# Patient Record
Sex: Female | Born: 1945 | Race: Black or African American | Hispanic: No | Marital: Married | Smoking: Never smoker
Health system: Southern US, Community
[De-identification: ages and names within clinical notes are randomized; demographics above are authoritative.]

## PROBLEM LIST (undated history)

## (undated) DIAGNOSIS — E785 Hyperlipidemia, unspecified: Secondary | ICD-10-CM

## (undated) DIAGNOSIS — I10 Essential (primary) hypertension: Secondary | ICD-10-CM

## (undated) DIAGNOSIS — F419 Anxiety disorder, unspecified: Secondary | ICD-10-CM

## (undated) DIAGNOSIS — M1990 Unspecified osteoarthritis, unspecified site: Secondary | ICD-10-CM

## (undated) DIAGNOSIS — G4730 Sleep apnea, unspecified: Secondary | ICD-10-CM

## (undated) DIAGNOSIS — K219 Gastro-esophageal reflux disease without esophagitis: Secondary | ICD-10-CM

## (undated) DIAGNOSIS — E039 Hypothyroidism, unspecified: Secondary | ICD-10-CM

## (undated) HISTORY — PX: CHOLECYSTECTOMY: SHX55

## (undated) HISTORY — PX: EYE SURGERY: SHX253

## (undated) HISTORY — PX: ABDOMINAL HYSTERECTOMY: SHX81

---

## 1997-04-12 ENCOUNTER — Emergency Department (HOSPITAL_COMMUNITY): Admission: EM | Admit: 1997-04-12 | Discharge: 1997-04-12 | Payer: Self-pay | Admitting: Emergency Medicine

## 1999-01-22 ENCOUNTER — Encounter: Payer: Self-pay | Admitting: Ophthalmology

## 1999-01-22 ENCOUNTER — Ambulatory Visit (HOSPITAL_COMMUNITY): Admission: RE | Admit: 1999-01-22 | Discharge: 1999-01-23 | Payer: Self-pay | Admitting: Ophthalmology

## 1999-08-20 ENCOUNTER — Other Ambulatory Visit: Admission: RE | Admit: 1999-08-20 | Discharge: 1999-08-20 | Payer: Self-pay | Admitting: Gynecology

## 2000-07-15 ENCOUNTER — Emergency Department (HOSPITAL_COMMUNITY): Admission: EM | Admit: 2000-07-15 | Discharge: 2000-07-15 | Payer: Self-pay | Admitting: Emergency Medicine

## 2000-07-15 ENCOUNTER — Encounter: Payer: Self-pay | Admitting: Emergency Medicine

## 2002-01-25 ENCOUNTER — Other Ambulatory Visit: Admission: RE | Admit: 2002-01-25 | Discharge: 2002-01-25 | Payer: Self-pay | Admitting: Gynecology

## 2002-11-19 ENCOUNTER — Emergency Department (HOSPITAL_COMMUNITY): Admission: EM | Admit: 2002-11-19 | Discharge: 2002-11-19 | Payer: Self-pay | Admitting: Emergency Medicine

## 2003-02-18 ENCOUNTER — Encounter: Admission: RE | Admit: 2003-02-18 | Discharge: 2003-02-18 | Payer: Self-pay | Admitting: Orthopedic Surgery

## 2003-03-01 ENCOUNTER — Emergency Department (HOSPITAL_COMMUNITY): Admission: EM | Admit: 2003-03-01 | Discharge: 2003-03-02 | Payer: Self-pay | Admitting: Emergency Medicine

## 2003-03-03 ENCOUNTER — Emergency Department (HOSPITAL_COMMUNITY): Admission: EM | Admit: 2003-03-03 | Discharge: 2003-03-03 | Payer: Self-pay | Admitting: Emergency Medicine

## 2003-03-06 ENCOUNTER — Ambulatory Visit (HOSPITAL_COMMUNITY): Admission: RE | Admit: 2003-03-06 | Discharge: 2003-03-06 | Payer: Self-pay | Admitting: Urology

## 2004-07-10 ENCOUNTER — Emergency Department: Payer: Self-pay | Admitting: Emergency Medicine

## 2004-07-10 ENCOUNTER — Other Ambulatory Visit: Payer: Self-pay

## 2004-09-13 ENCOUNTER — Emergency Department: Payer: Self-pay | Admitting: General Practice

## 2007-02-05 ENCOUNTER — Emergency Department (HOSPITAL_COMMUNITY): Admission: EM | Admit: 2007-02-05 | Discharge: 2007-02-05 | Payer: Self-pay | Admitting: Emergency Medicine

## 2008-02-28 ENCOUNTER — Encounter (HOSPITAL_COMMUNITY): Admission: RE | Admit: 2008-02-28 | Discharge: 2008-05-28 | Payer: Self-pay | Admitting: Endocrinology

## 2008-03-26 ENCOUNTER — Other Ambulatory Visit: Admission: RE | Admit: 2008-03-26 | Discharge: 2008-03-26 | Payer: Self-pay | Admitting: Interventional Radiology

## 2008-03-26 ENCOUNTER — Encounter (INDEPENDENT_AMBULATORY_CARE_PROVIDER_SITE_OTHER): Payer: Self-pay | Admitting: Interventional Radiology

## 2008-03-26 ENCOUNTER — Encounter: Admission: RE | Admit: 2008-03-26 | Discharge: 2008-03-26 | Payer: Self-pay | Admitting: Endocrinology

## 2008-03-31 ENCOUNTER — Inpatient Hospital Stay (HOSPITAL_COMMUNITY): Admission: EM | Admit: 2008-03-31 | Discharge: 2008-04-08 | Payer: Self-pay | Admitting: Emergency Medicine

## 2008-05-01 ENCOUNTER — Encounter (HOSPITAL_COMMUNITY): Admission: RE | Admit: 2008-05-01 | Discharge: 2008-07-30 | Payer: Self-pay | Admitting: Endocrinology

## 2008-06-20 ENCOUNTER — Emergency Department: Payer: Self-pay | Admitting: Emergency Medicine

## 2008-10-12 ENCOUNTER — Emergency Department: Payer: Self-pay | Admitting: Emergency Medicine

## 2009-03-03 ENCOUNTER — Emergency Department (HOSPITAL_COMMUNITY): Admission: EM | Admit: 2009-03-03 | Discharge: 2009-03-03 | Payer: Self-pay | Admitting: Emergency Medicine

## 2009-08-25 IMAGING — US US BIOPSY
1 series · 13 of 17 positions shown · non-contrast
Comparison: none

Clinical Data/Indication: Bilateral thyroid nodules

ULTRASOUND-GUIDED BIOPSYBILATERAL DOMINANT THYROID NODULES
Procedure: The procedure, risks, benefits, and alternatives were
explained to the patient. Questions regarding the procedure were
encouraged and answered. The patient understands and consents to
the procedure.
The neck was prepped withbetadine in a sterile fasion, and a
sterile drape was applied covering the operative field. A sterile
gown and sterile gloves were used for the procedure.
Under sonographic guidance, three 25 gauge fine needle aspirates of
the dominant left thyroid nodule were obtained.  Subsequently,
under sonographic guidance, one 25 gauge fine needle aspirates of
the dominant right thyroid nodule was obtained. Subsequently, two
fine needle aspirates of the dominant right thyroid nodule were
obtained.  Final imaging was performed.
Patient tolerated the procedure well without complication.  Vital
sign monitoring by nursing staff during the procedure will continue
as patient is in the special procedures unit for post procedure
observation.

[Series 1: us biopsy · 0.09mm/px · 17 acquisitions, 13 frames shown]
[im 1/17]
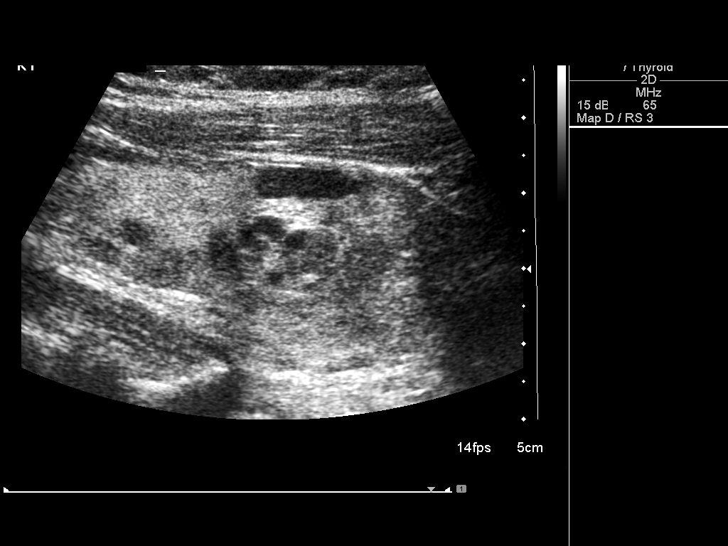
[im 2/17]
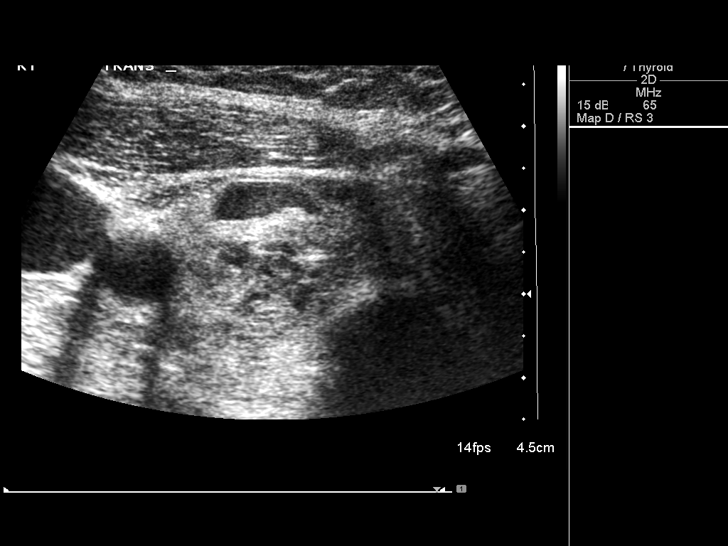
[im 4/17]
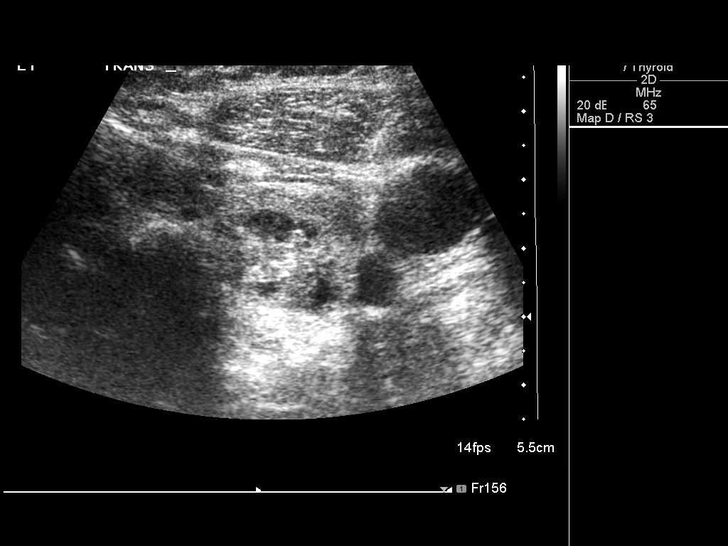
[im 5/17]
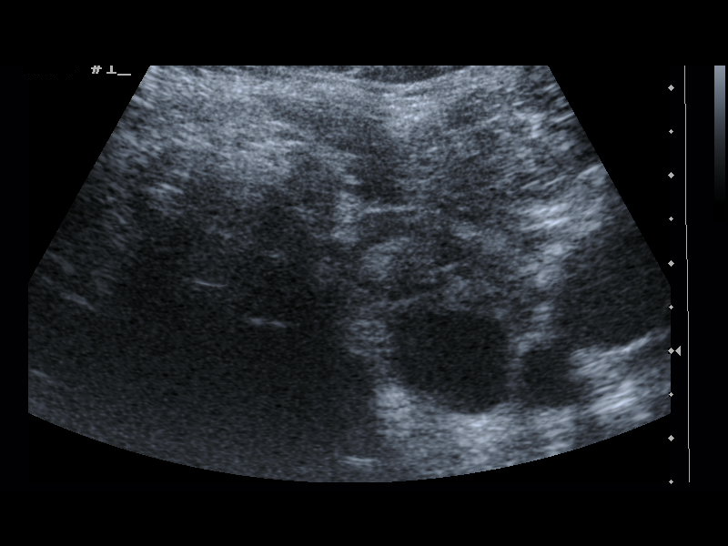
[im 6/17]
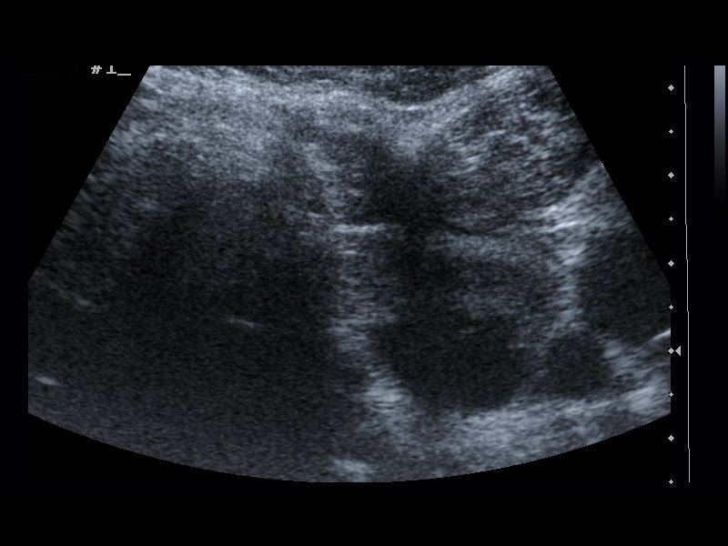
[im 8/17]
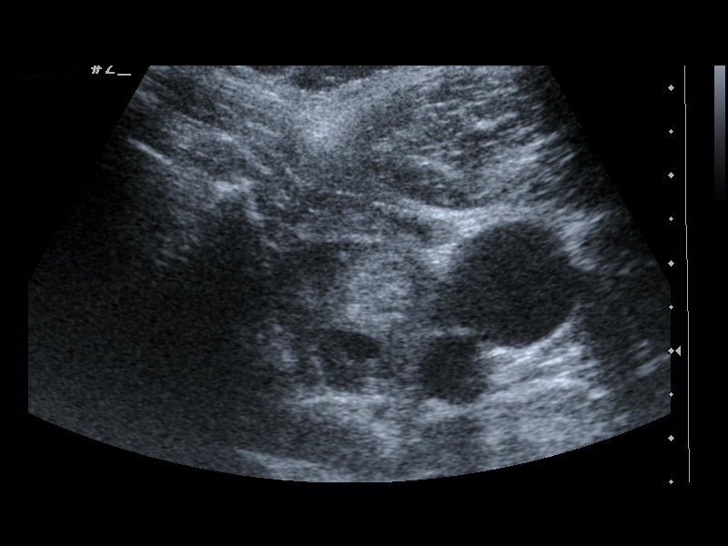
[im 9/17]
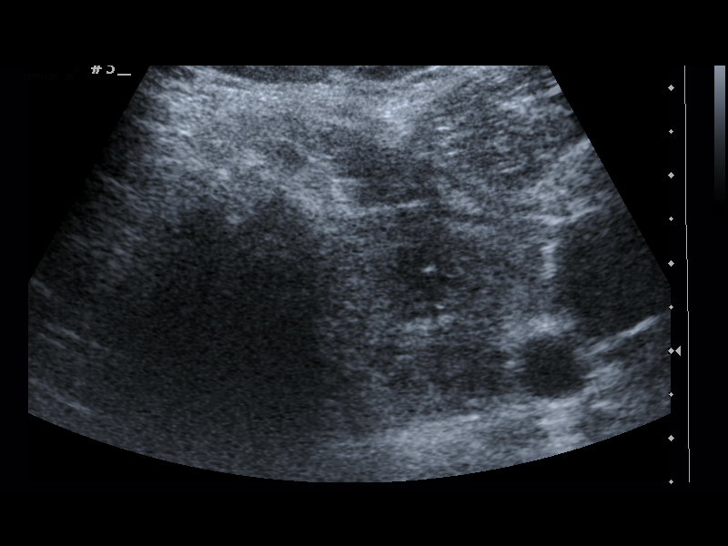
[im 10/17]
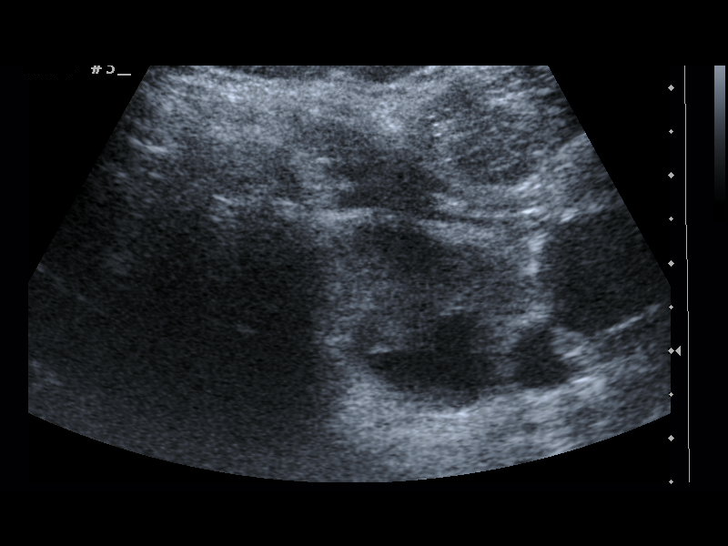
[im 12/17]
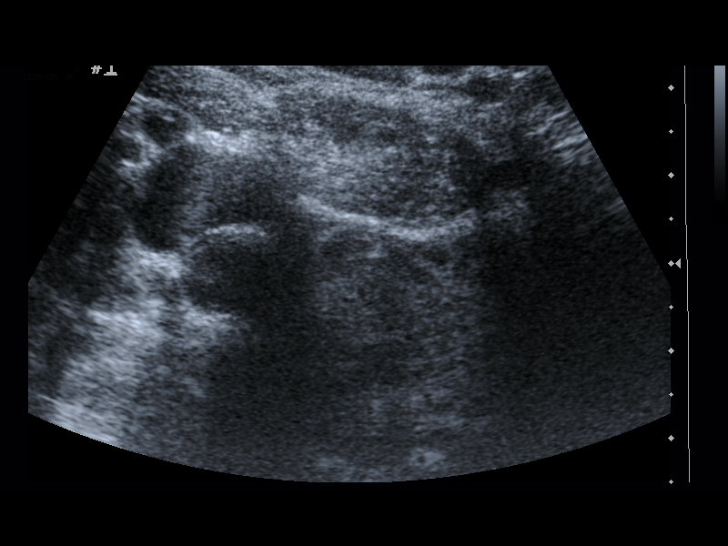
[im 13/17]
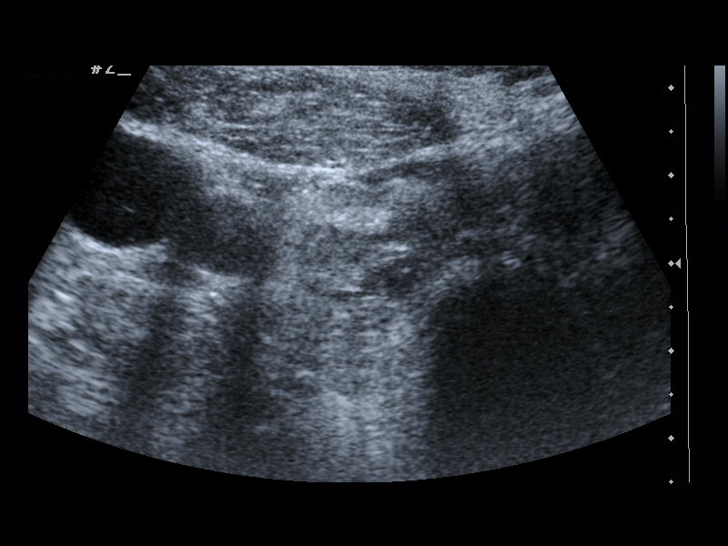
[im 14/17]
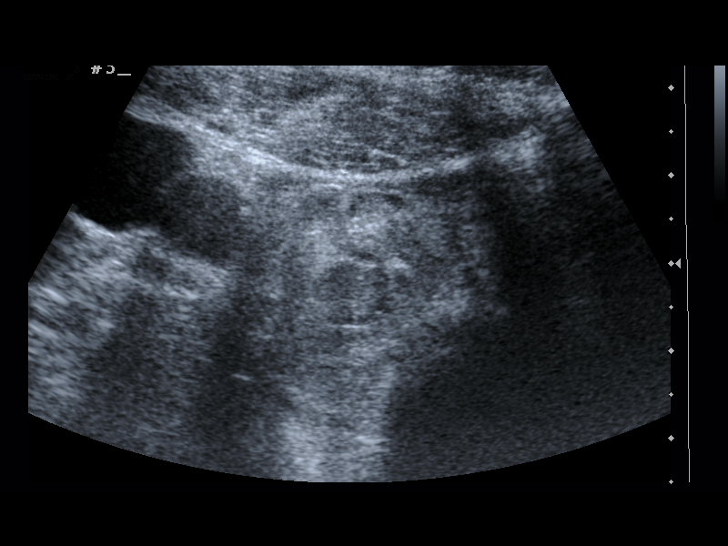
[im 16/17]
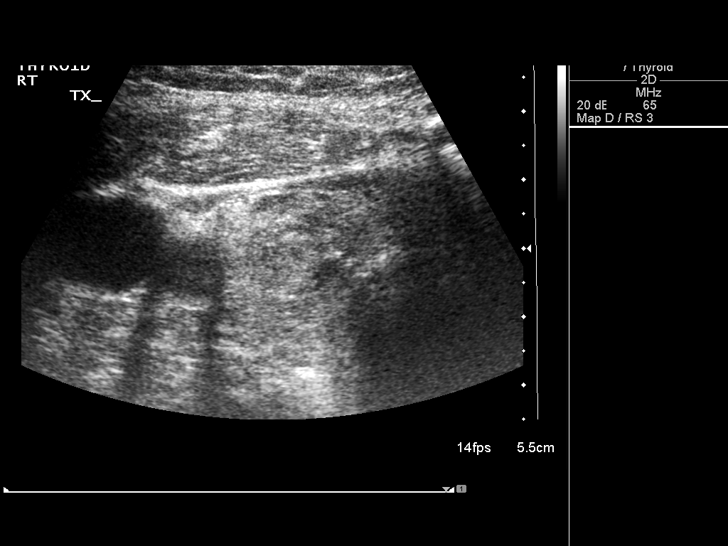
[im 17/17]
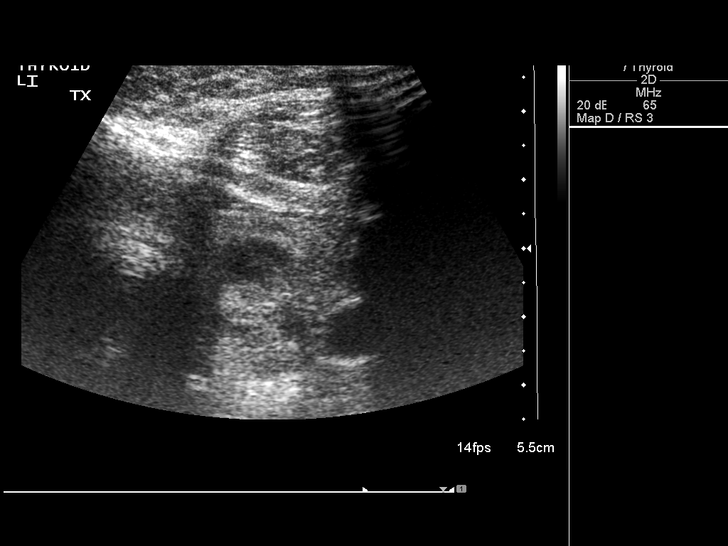

[13 of 17 positions shown; findings below may reference images not displayed]

FINDINGS: The images document guide needle placement within the
bilateral thyroid nodules. Post biopsy images demonstrate no
hemorrhage.
IMPRESSION: Successful ultrasound-guided fine needle aspiration of bilateral
thyroid nodules.

## 2009-09-30 IMAGING — NM NM RAI THERAPY FOR HYPERTHYROIDISM
1 series · 1 of 1 positions shown · non-contrast
Comparison: Thyroid uptake and scan dated 02/29/2008

CLINICAL DATA: Hyperthyroidism

NUCLEAR MEDICINE RADIOACTIVE IODINE THERAPY FOR HYPERTHYROIDISM
TECHNIQUE: The risks and benefits of radioactive iodine therapy
were discussed with the patient in detail. Alternative therapies
were also mentioned. Radiation safety was discussed with the
patient, including how to protect the general public from exposure.
There were no barriers to communication.  Written consent was
obtained.  The patient then received a capsule containing the
radiopharmaceutical.  The patient will follow-up with the referring
physician.
Radiopharmaceutical: 29.1 millicuries of I 131 sodium iodide

[st static image · 1 of 1 slices shown]
[im 1/1]
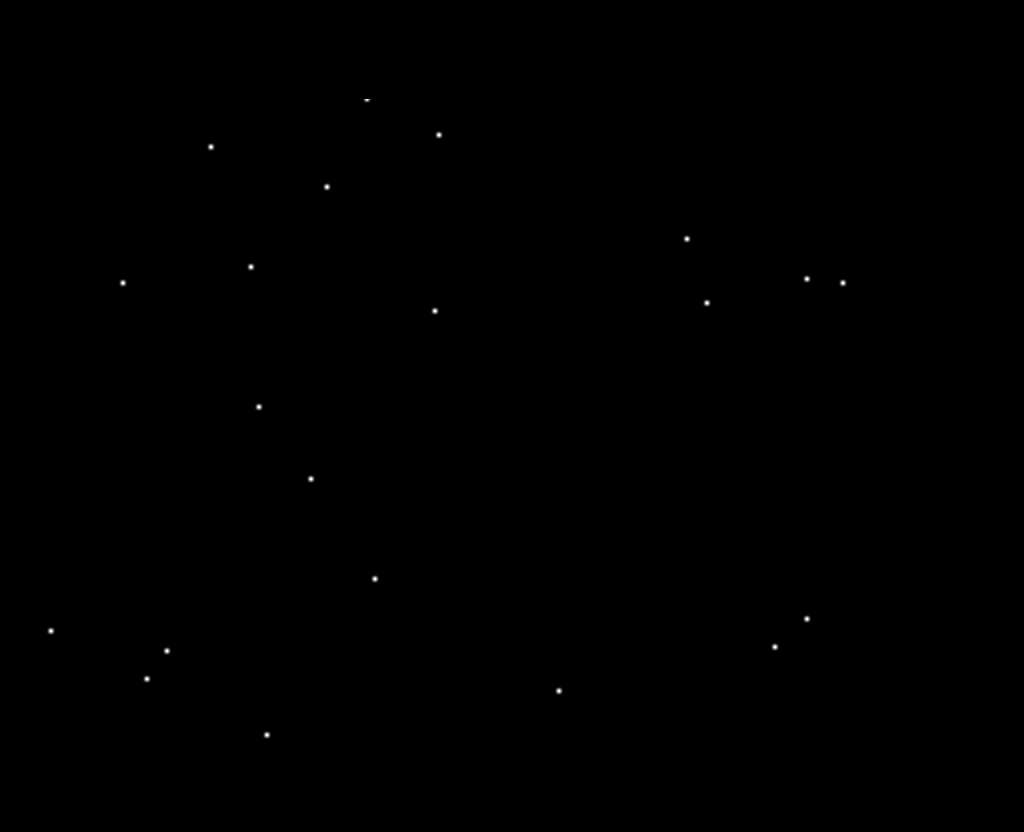

[1 of 1 positions shown; findings below may reference images not displayed]

IMPRESSION: Per oral administration of radioactive iodine for the treatment of
hyperthyroidism.

## 2009-12-18 ENCOUNTER — Emergency Department (HOSPITAL_COMMUNITY)
Admission: EM | Admit: 2009-12-18 | Discharge: 2009-12-18 | Payer: Self-pay | Source: Home / Self Care | Admitting: Emergency Medicine

## 2010-02-10 ENCOUNTER — Other Ambulatory Visit: Payer: Self-pay | Admitting: Obstetrics and Gynecology

## 2010-03-19 ENCOUNTER — Emergency Department (HOSPITAL_COMMUNITY)
Admission: EM | Admit: 2010-03-19 | Discharge: 2010-03-19 | Disposition: A | Discharge status: Home / Self Care | Payer: Medicare Other | Attending: Emergency Medicine | Admitting: Emergency Medicine

## 2010-03-19 ENCOUNTER — Emergency Department (HOSPITAL_COMMUNITY): Payer: Medicare Other

## 2010-03-19 DIAGNOSIS — E119 Type 2 diabetes mellitus without complications: Secondary | ICD-10-CM | POA: Insufficient documentation

## 2010-03-19 DIAGNOSIS — M5430 Sciatica, unspecified side: Secondary | ICD-10-CM | POA: Insufficient documentation

## 2010-03-19 DIAGNOSIS — Z794 Long term (current) use of insulin: Secondary | ICD-10-CM | POA: Insufficient documentation

## 2010-03-19 DIAGNOSIS — M549 Dorsalgia, unspecified: Secondary | ICD-10-CM | POA: Insufficient documentation

## 2010-03-19 DIAGNOSIS — I10 Essential (primary) hypertension: Secondary | ICD-10-CM | POA: Insufficient documentation

## 2010-04-01 LAB — URINALYSIS, ROUTINE W REFLEX MICROSCOPIC
Bilirubin Urine: NEGATIVE
Glucose, UA: NEGATIVE mg/dL
Hgb urine dipstick: NEGATIVE
Ketones, ur: NEGATIVE mg/dL
Nitrite: NEGATIVE
Protein, ur: NEGATIVE mg/dL
Specific Gravity, Urine: 1.026 (ref 1.005–1.030)
Urobilinogen, UA: 0.2 mg/dL (ref 0.0–1.0)
pH: 5.5 (ref 5.0–8.0)

## 2010-04-23 LAB — GLUCOSE, CAPILLARY
Glucose-Capillary: 101 mg/dL — ABNORMAL HIGH (ref 70–99)
Glucose-Capillary: 104 mg/dL — ABNORMAL HIGH (ref 70–99)
Glucose-Capillary: 108 mg/dL — ABNORMAL HIGH (ref 70–99)
Glucose-Capillary: 113 mg/dL — ABNORMAL HIGH (ref 70–99)
Glucose-Capillary: 114 mg/dL — ABNORMAL HIGH (ref 70–99)
Glucose-Capillary: 118 mg/dL — ABNORMAL HIGH (ref 70–99)
Glucose-Capillary: 119 mg/dL — ABNORMAL HIGH (ref 70–99)
Glucose-Capillary: 120 mg/dL — ABNORMAL HIGH (ref 70–99)
Glucose-Capillary: 126 mg/dL — ABNORMAL HIGH (ref 70–99)
Glucose-Capillary: 132 mg/dL — ABNORMAL HIGH (ref 70–99)
Glucose-Capillary: 135 mg/dL — ABNORMAL HIGH (ref 70–99)
Glucose-Capillary: 144 mg/dL — ABNORMAL HIGH (ref 70–99)
Glucose-Capillary: 62 mg/dL — ABNORMAL LOW (ref 70–99)
Glucose-Capillary: 63 mg/dL — ABNORMAL LOW (ref 70–99)
Glucose-Capillary: 84 mg/dL (ref 70–99)
Glucose-Capillary: 89 mg/dL (ref 70–99)
Glucose-Capillary: 94 mg/dL (ref 70–99)
Glucose-Capillary: 96 mg/dL (ref 70–99)

## 2010-04-23 LAB — BASIC METABOLIC PANEL
BUN: 13 mg/dL (ref 6–23)
BUN: 17 mg/dL (ref 6–23)
BUN: 18 mg/dL (ref 6–23)
BUN: 21 mg/dL (ref 6–23)
CO2: 27 mEq/L (ref 19–32)
CO2: 32 mEq/L (ref 19–32)
Calcium: 8.9 mg/dL (ref 8.4–10.5)
Calcium: 8.9 mg/dL (ref 8.4–10.5)
Calcium: 8.9 mg/dL (ref 8.4–10.5)
Chloride: 107 mEq/L (ref 96–112)
Chloride: 107 mEq/L (ref 96–112)
Creatinine, Ser: 1.62 mg/dL — ABNORMAL HIGH (ref 0.4–1.2)
Creatinine, Ser: 1.75 mg/dL — ABNORMAL HIGH (ref 0.4–1.2)
Creatinine, Ser: 1.98 mg/dL — ABNORMAL HIGH (ref 0.4–1.2)
GFR calc Af Amer: 36 mL/min — ABNORMAL LOW (ref >60)
GFR calc Af Amer: 37 mL/min — ABNORMAL LOW (ref >60)
GFR calc Af Amer: 39 mL/min — ABNORMAL LOW (ref >60)
GFR calc non Af Amer: 26 mL/min — ABNORMAL LOW (ref >60)
GFR calc non Af Amer: 30 mL/min — ABNORMAL LOW (ref >60)
GFR calc non Af Amer: 30 mL/min — ABNORMAL LOW (ref >60)
GFR calc non Af Amer: 31 mL/min — ABNORMAL LOW (ref >60)
GFR calc non Af Amer: >60 mL/min (ref >60)
Glucose, Bld: 126 mg/dL — ABNORMAL HIGH (ref 70–99)
Glucose, Bld: 197 mg/dL — ABNORMAL HIGH (ref 70–99)
Glucose, Bld: 90 mg/dL (ref 70–99)
Potassium: 3.4 mEq/L — ABNORMAL LOW (ref 3.5–5.1)
Potassium: 3.6 mEq/L (ref 3.5–5.1)
Potassium: 4 mEq/L (ref 3.5–5.1)
Sodium: 138 mEq/L (ref 135–145)
Sodium: 139 mEq/L (ref 135–145)
Sodium: 140 mEq/L (ref 135–145)
Sodium: 142 mEq/L (ref 135–145)

## 2010-04-23 LAB — URINE MICROSCOPIC-ADD ON

## 2010-04-23 LAB — CBC
HCT: 37.4 % (ref 36.0–46.0)
Hemoglobin: 10.4 g/dL — ABNORMAL LOW (ref 12.0–15.0)
Hemoglobin: 10.7 g/dL — ABNORMAL LOW (ref 12.0–15.0)
MCV: 86.2 fL (ref 78.0–100.0)
MCV: 86.5 fL (ref 78.0–100.0)
Platelets: 139 10*3/uL — ABNORMAL LOW (ref 150–400)
Platelets: 149 10*3/uL — ABNORMAL LOW (ref 150–400)
RBC: 3.65 MIL/uL — ABNORMAL LOW (ref 3.87–5.11)
RBC: 3.75 MIL/uL — ABNORMAL LOW (ref 3.87–5.11)
RDW: 14 % (ref 11.5–15.5)
RDW: 14.2 % (ref 11.5–15.5)
WBC: 4.8 10*3/uL (ref 4.0–10.5)
WBC: 5.2 10*3/uL (ref 4.0–10.5)
WBC: 5.5 10*3/uL (ref 4.0–10.5)
WBC: 6.2 10*3/uL (ref 4.0–10.5)

## 2010-04-23 LAB — DIFFERENTIAL
Basophils Absolute: 0.1 10*3/uL (ref 0.0–0.1)
Eosinophils Relative: 1 % (ref 0–5)
Lymphocytes Relative: 28 % (ref 12–46)
Lymphocytes Relative: 31 % (ref 12–46)
Lymphs Abs: 1.5 10*3/uL (ref 0.7–4.0)
Monocytes Absolute: 0.5 10*3/uL (ref 0.1–1.0)
Monocytes Relative: 11 % (ref 3–12)
Monocytes Relative: 11 % (ref 3–12)
Neutro Abs: 2.7 10*3/uL (ref 1.7–7.7)

## 2010-04-23 LAB — URINALYSIS, ROUTINE W REFLEX MICROSCOPIC
Glucose, UA: NEGATIVE mg/dL
Hgb urine dipstick: NEGATIVE
Protein, ur: 30 mg/dL — AB
Specific Gravity, Urine: 1.022 (ref 1.005–1.030)
pH: 5 (ref 5.0–8.0)

## 2010-04-23 LAB — CULTURE, BLOOD (ROUTINE X 2)
Culture: NO GROWTH
Culture: NO GROWTH

## 2010-04-23 LAB — SODIUM, URINE, RANDOM: Sodium, Ur: 86 mEq/L

## 2010-04-23 LAB — CREATININE, URINE, RANDOM: Creatinine, Urine: 65 mg/dL

## 2010-04-23 LAB — CULTURE, RESPIRATORY W GRAM STAIN

## 2010-04-23 LAB — HEMOGLOBIN A1C
Hgb A1c MFr Bld: 8.1 % — ABNORMAL HIGH (ref 4.6–6.1)
Mean Plasma Glucose: 186 mg/dL

## 2010-04-23 LAB — EXPECTORATED SPUTUM ASSESSMENT W GRAM STAIN, RFLX TO RESP C

## 2010-04-23 LAB — URINE CULTURE: Colony Count: NO GROWTH

## 2010-04-23 LAB — BRAIN NATRIURETIC PEPTIDE: Pro B Natriuretic peptide (BNP): <30 pg/mL (ref 0.0–100.0)

## 2010-05-19 ENCOUNTER — Other Ambulatory Visit: Payer: Self-pay | Admitting: Dermatology

## 2010-05-26 NOTE — H&P (Signed)
NAMECYRILLA, [name] NO.:  [id number]   MEDICAL RECORD NO.:  [id number]          PATIENT TYPE:  INP   LOCATION:  1414                         FACILITY:  [facility]   PHYSICIAN:  Hollice Espy, M.D.DATE OF BIRTH:  05-14-1945   DATE OF ADMISSION:  03/31/2008  DATE OF DISCHARGE:                              HISTORY & PHYSICAL   PCP:  Dr. Casimiro Needle Altheimer.   CHIEF COMPLAINT:  Shortness of breath and cough.   HISTORY OF PRESENT ILLNESS:  The patient is a 62-year African American  female with a past medical history of obesity, hypertension and diabetes  mellitus who for the last 5 days has had problems with a productive  cough and shortness of breath.  She spoke to her PCP who had her on a Z-  Pak.  She had been on a Z-Pak for about 3 days but her symptoms actually  worsened.  She said she was having significant cough with greenish  sputum, dyspnea on exertion and overall worsening of symptoms.  She also  was feeling some low-grade fever so she came in the emergency room.  In  the emergency room she was actually noted to have a normal white count  with no shift.  However, a chest x-ray was noted to have some bibasilar  atelectasis versus infiltrates.  Other labs were done on the patient and  she was found to have a potassium of 3.4 and a blood sugar with CBG of  197.  She was noted to have a low-grade temperature of 100.1.  The felt  better after receiving 2 liters of oxygen, she was saturating 90% on  room air, 97% on 2 liters.  She was given Rocephin and Zithromax in the  emergency room as well as medicine for cough and a breathing treatment.  When I saw the patient she was doing okay, she complained of some  continued cough and shortness of breath, although much better than when  she first came in.  She denies any headaches, vision changes or  dysphagia, chest pain, palpitations, no abdominal pain.  No hematuria,  dysuria, constipation, diarrhea, focal extremity  numbness, weakness or  pain.  Review of systems otherwise negative.   PAST MEDICAL HISTORY:  1. Obesity.  2. Diabetes mellitus.  3. Hypertension.  She says her diabetes is not well controlled.   MEDICATIONS:  The patient is on:  1. Atacand 50.  2. Cardura 8.  3. Glipizide 5.  4. Hydrochlorothiazide 25.  5. The ER lists amiodarone, but the patient says she is on amlodipine,      will need to confirm this as well as the dose.  6. Lantus 90 units nightly.  7. Humalog sliding scale.   She has allergy to DEMEROL.   SOCIAL HISTORY:  She denies any tobacco, alcohol or drug use, although  she says her husband had smoked for about 20 years, he quit about 10-15  years ago.   FAMILY HISTORY:  Noncontributory.   PHYSICAL EXAMINATION:  VITALS ON ADMISSION:  Temperature 100.1, heart  rate 94, blood pressure 125/62, respirations 18, O2  sat 90% on room air,  97% on 2 liters.  GENERAL:  She is alert and oriented x3.  In no apparent distress.  HEENT:  Normocephalic, atraumatic.  Her mucous membranes are slightly  dry.  She has no carotid bruits.  HEART:  Regular rate and rhythm, S1-S2.  LUNGS:  Bilateral wheezing with some decreased breath sounds at the  bases.  ABDOMEN:  Soft, obese, nontender, positive bowel sounds.  EXTREMITIES:  No clubbing, cyanosis, trace pitting edema.   LAB WORK:  Sodium 138, potassium 3.4, chloride 95, bicarbonate 28.  BUN  13, creatinine 0.9, glucose 197.  White count 5.5, hemoglobin and  hematocrit 12.3 and 37, MCV of 87, platelet count 149.  I have ordered a  BNP which is pending.  Chest x-ray is as per HPI.   ASSESSMENT/PLAN:  1. Pneumonia.  Nebulizers plus oxygen plus antibiotics, question      component of mild chronic obstructive pulmonary disease secondary      to secondhand smoke.  2. Diabetes mellitus.  Sliding scale insulin plus Lantus.  3. Hypertension.  Continue medications.  Will check on confirming her      home medications.  4.  Obesity.      Hollice Espy, M.D.  Electronically Signed     SKK/MEDQ  D:  03/31/2008  T:  03/31/2008  Job:  [id number]   cc:   Veverly Fells. Altheimer, M.D.  Fax: [phone]

## 2010-05-26 NOTE — Discharge Summary (Signed)
[name], NORDQUIST NO.:  [id number]   MEDICAL RECORD NO.:  [id number]          PATIENT TYPE:  INP   LOCATION:  1414                         FACILITY:  [facility]   PHYSICIAN:  Ramiro Harvest, MD    DATE OF BIRTH:  Mar 18, 1945   DATE OF ADMISSION:  03/31/2008  DATE OF DISCHARGE:  04/07/2008                               DISCHARGE SUMMARY   PRIMARY CARE PHYSICIAN:  Veverly Fells. Altheimer, M.D.   DISCHARGE DIAGNOSES:  1. Pneumonia.  2. Acute renal insufficiency.  3. Hypertension.  4. Type 2 diabetes.  5. Probable hyperthyroidism per patient.  6. Gastroesophageal reflux disease.  7. Obesity.   DISCHARGE MEDICATIONS:  1. Atacand 32 mg p.o. daily to restart in 3-4 days.  2. HCTZ 25 mg 2 tablets p.o. daily to restart in 3-4 days.  3. Cardura 8 mg p.o. daily.  4. Clonidine 0.1 mg patch q. weekly.  5. Toprol XL 50 mg p.o. daily.  6. Norvasc 10 mg p.o. daily.  7. Humalog sliding scale as previously taken.  8. Alprazolam 0.25 mg p.o. t.i.d. p.r.n.  9. ProAir inhaler as needed.  10.Mucinex 600 mg p.o. b.i.d.  11.Prevacid 30 mg p.o. b.i.d.   The patient was instructed to hold her Lantus until followup with her  PCP secondary to hypoglycemia during the hospitalization, or to restart  at half her home dose if the patient's blood sugars are consistently  greater than 200.   DISPOSITION AND FOLLOWUP:  The patient will be discharged home.  The  patient has been instructed to hold her HCTZ and Atacand and restart  them in 3-4 days secondary to dehydration and acute renal insufficiency.  The patient is to follow up with her PCP in 1 week.  On followup, a  basic metabolic profile needs to be checked to follow up on her  electrolytes and renal function.  The patient's diabetes will need to be  reassessed, as the patient's glipizide was discontinued on discharge  secondary to hypoglycemic spells during the hospitalization.  Her Lantus  has also been held secondary to  hypoglycemia.  The patient will only be  discharged home on a sliding scale insulin.  The patient has been  instructed that if CBGs  are consistently greater than 200 to restart  her Lantus at about a third her home dose.  The patient will also likely  need a follow-up chest x-ray in 4-6 weeks for resolution of her  pneumonia.   CONSULTATIONS DONE:  None.   PROCEDURES PERFORMED:  1. A chest x-ray was done on March 31, 2008 that showed bibasilar      atelectasis versus infiltrates.  2. A chest x-ray was done on April 03, 2008 that showed similar slight      increase in left lower lobe airspace disease suspicious for      infection, low lung volumes with similar right base atelectasis.   BRIEF HOSPITAL HISTORY AND PHYSICAL:  [name] is a pleasant 65-  year-old [place] American female, past medical history of obesity,  hypertension, type 2 diabetes, who for the past 5 days  prior to  admission had had problems with a productive cough and shortness of  breath.  The patient spoke to her PCP who had placed her on a Z-Pak.  The patient had been on a Z-Pak for about 3 days, but her symptoms  actually had worsened.  The patient stated that she was having  significant cough with greenish sputum, shortness of breath on exertion,  and overall worsening of her symptoms.  The patient was also feeling  some low-grade fever, so she presented to the ED.  In the ED she was  actually noted to have a normal white count with no shift.  However,  chest x-ray was noted to have some bibasilar atelectasis versus  infiltrates.  Other labs were done on the patient.  She was found to  have a potassium of 3.4, a CBG of 197, also noted to have a low-grade  temp of 100.1.  The patient felt better after receiving 2 liters oxygen  and was saturating 90% on room air and 97% on 2 liters.  The patient was  given IV Rocephin and Zithromax in the ED as well as medicine for cough  and a breathing treatment.  When  the patient was seen by admitting  physician she was doing okay, complained of some continued cough and  shortness of breath although much better than when she first presented.  The patient denied any headaches, no visual changes or dysphagia.  No  chest pain, no palpitations.  No abdominal pain, no hematuria, no  dysuria, no constipation, no diarrhea.  No focal extremity numbness,  weakness or pain.  Review of systems was otherwise negative.   PHYSICAL EXAMINATION:  Per admitting physician, temperature 100.1, pulse  of 94, blood pressure 125/62, respirations 18, saturating 90% on room  air, 97% on 2 liters nasal cannula.  GENERAL:  The patient was alert and oriented x3, in no apparent  distress.  HEENT:  Normocephalic, atraumatic.  Slightly dry mucous membranes.  No  carotid bruits.  CARDIOVASCULAR:  Regular rate and rhythm, S1-S2.  LUNGS:  Had bilateral wheezing with some decreased breath sounds at the  bases.  ABDOMEN:  Soft, obese, nontender, positive bowel sounds.  EXTREMITIES:  No clubbing, no cyanosis.  Trace pitting edema.   ADMISSION LABORATORIES:  Sodium 138, potassium 3.4, chloride 95, bicarb  28, BUN 13, creatinine 0.9, glucose of 197.  CBC with a white count of  5.5, hemoglobin of 12.3, hematocrit of 37, MCV of 87, platelet count of  149.  Chest x-ray was obtained as stated above.   HOSPITAL COURSE:  1. Pneumonia:  The patient was brought in with a pneumonia that was      worsening on outpatient regimen and treatment.  Sputum cultures      were obtained which came back negative.  BNP was also obtained      which was less than 30.  The patient was placed on oxygen,      nebulizer treatments, Mucinex and IV vancomycin and IV Zosyn.  The      patient remained afebrile throughout the hospitalization with slow      clinical improvement.  The patient's vancomycin was discontinued on      April 03, 2008.  The patient was maintained on Zosyn and monitored      on O2.  The  patient's Zosyn was then transitioned to oral Avelox on      March to 26, 2010, which the patient tolerated well.  The  patient      will finish a 7-day course of antibiotics in house, and as such      will not need any further antibiotic treatment as an outpatient.      The patient will need a repeat chest x-ray done in about 4-6 weeks      for resolution of pneumonia, and the patient will be discharged in      stable and improved condition.  2. Acute renal insufficiency:  The patient during the hospitalization      was noted to develop an acute renal insufficiency with creatinine      as high as 1.98.  It was felt this was secondary to a prerenal      azotemia.  The patient's Atacand and HCTZ were held during this      time.  The patient was placed on some IV fluids with improvement in      her renal function on a daily basis.  The patient will be      discharged home off of her Atacand and HCTZ, to restart those in 3-      4 days post discharge.  The patient will need to follow up with her      PCP in 1 week, and on followup will need a basic metabolic profile      to follow up on electrolytes and renal function.  On the day of      discharge, the patient's creatinine had improved to 1.62.  The      patient will be discharged in stable and improved condition.  3. Hypertension:  Stable throughout the hospitalization.  The patient      was maintained on Cardura and Norvasc as well as Toprol.  The      patient's HCTZ and Atacand were held secondary to problem #2.  The      patient will follow up with PCP in terms of her blood pressure      medications and will restart her HCTZ and Atacand in 3-4 days post      discharge.  4. Type 2 diabetes:  The patient was initially maintained on her home      regimen of Lantus, glipizide and sliding scale during the      hospitalization.  However, the patient had some hypoglycemic spells      during the hospitalization and as such her glipizide and  Lantus      were discontinued.  The patient was maintained on a sliding scale      insulin with CBGs ranging from 108 to 120s.  The patient remained      in stable condition.  The patient will be discharged home off of      her glipizide and her Lantus.  The patient has been instructed not      to restart her Lantus until she follows up with her PCP in 1 week      or unless her CBGs are consistently over 200.  She has been      instructed to start at a third of her home dose.  The patient will      be discharged in stable condition.  The rest of the patient's      chronic medical issues were stable throughout the hospitalization.      The patient will be discharged in stable and improved condition.      On day of discharge, vital signs temperature 99.1, pulse of 73,  blood pressure 167/76, respiratory rate 16, saturation 94% on room      air.   DISCHARGE LABORATORIES:  Sodium 139, potassium 3.6, chloride 107, bicarb  27, BUN 18, creatinine 1.62, glucose of 116, and a calcium of 8.7.  CBC  with a white count of 6.2, hemoglobin of 10.4, platelets of 196,  hematocrit of 31.5.  Blood cultures were negative x2.   It was a pleasure taking care of [name].      Ramiro Harvest, MD  Electronically Signed     DT/MEDQ  D:  04/08/2008  T:  04/08/2008  Job:  [id number]   cc:   Veverly Fells. Altheimer, M.D.  Fax: [phone]

## 2010-05-29 NOTE — H&P (Signed)
NAME:  [name], [name] NO.:  [id number]   MEDICAL RECORD NO.:  [id number]                   PATIENT TYPE:  EMS   LOCATION:  ED                                   FACILITY:  [facility]   PHYSICIAN:  Veverly Fells. Altheimer, M.D.          DATE OF BIRTH:  July 01, 1945   DATE OF ADMISSION:  03/01/2003  DATE OF DISCHARGE:                                HISTORY & PHYSICAL   REASON FOR ADMISSION:  Persistent vomiting for three days with substernal  discomfort and slight troponin elevation.   HISTORY:  This is a 65 year old obese black female with type 2 insulin  requiring diabetes.  She was in her usual state of health until the morning  of February 27, 2003, when she awoke with low-grade fevers and chills as  well as persistent nausea and vomiting.  These symptoms have persisted since  then.  She has had little p.o. intake despite Phenergan suppositories.  She  has continued with frequent vomiting.  She developed substernal burning  like reflux which was worse upon vomiting or retching.  She has not had  any abdominal pain.  Her last bowel movement was two days ago.  She has  developed some dry mouth and moderate weakness.  Diabetes has been in good  control, and she has been taking her usual insulin throughout the week.  CBG  was up to 114 this morning which is the highest she has seen since she has  been sick.  She has not had any significant relief of her symptoms despite  being in the emergency room for about 10 hours with 2 L of IV fluids,  Phenergan IV, and Zofran.  Troponin was mildly elevated at 0.1 (less then  0.04), with CK slightly elevated at 283, with MB index normal.   PAST MEDICAL HISTORY:  1. Diabetes mellitus type 2, diagnosed in 1986, insulin requiring,     associated with obesity.  Last A1C 7.9% on February 13, 2003, indicating     poor control.  2. Dyslipidemia.  3. Hypertension, requiring several medications.  4. Proteinuria.  5. Proliferative  diabetic retinopathy and macular edema, status post laser     treatments o.u. and status post vitrectomy in January 2001.  6. Status post TAH/BSO.  7. History of recurrent dyspepsia and gastroesophageal reflux disease,     fairly well controlled with PPI with occasional stress related symptoms.  8. Status post laparoscopic cholecystectomy in 1996.  9. Irritable bowel syndrome.  10.      History of nephrolithiasis, status post lithotripsy in 1987.  11.      History of recurrent urinary tract infections, none recently.  12.      Carpal tunnel syndrome.  13.      Chronic knee and back pain.  14.      Chronic anxiety with a history of panic attacks and history of     depression which has  been fairly well controlled for the past few years.   ALLERGIES:  1. DEMEROL.  2. KEFLEX.  3. SULFA.  4. MACRODANTIN.  5. GLUCOPHAGE caused diarrhea.  6. AVANDIA caused nausea.  7. PROCARDIA XL caused cardiac awareness.  8. NEXIUM caused non-specific mouth symptoms.  9. VOLMAX caused lightheadedness.  10.      TOPROL XL caused itching, although she is currently on it without     difficulty.   MEDICATIONS:  1. Glucotrol XL 10 mg daily.  2. Actos 30 mg daily.  3. Humulin 70/30 45 units 250, 5 units before breakfast, and 35 245 units at     bedtime.  4. Lipitor 40 mg daily.  5. Hydrochlorothiazide 25 mg two tablets daily.  6. K-Dur 20 mEq daily.  7. Norvasc 10 mg daily.  8. Accupril 40 mg daily.  9. Cardura 8 mg daily.  10.      Atacand 32 mg daily.  11.      Toprol XL 50 mg q.o.d.  12.      Prevacid 30 mg daily or b.i.d.  13.      Levsin sublingual p.r.n., although not recently.  14.      Allegra 60 mg b.i.d. p.r.n.  15.      Tessalon pearls h.s. p.r.n.  16.      Tussionex occasionally p.r.n. cough.  17.      Tylenol about twice a week.  18.      Flexeril 10 mg b.i.d. p.r.n.  19.      Vicodin occasionally p.r.n. musculoskeletal pains.  20.      Alprazolam 0.25 mg t.i.d. p.r.n. with average  less than one per day     per currently.  21.      Zoloft 100 mg 1/2 tablet daily.   FAMILY HISTORY:  Noncontributory.   SOCIAL HISTORY:  She lives with her husband.  She is retired from the  [employer].  She does not drink or smoke cigarettes.   REVIEW OF SYSTEMS:  Negative except as above.  She denies headache or visual  change.  She denies dyspnea or palpitations.  She denies current urinary  symptoms.   PHYSICAL EXAMINATION:  GENERAL:  Alert, pleasant, cooperative 65 year old  obese black female in no acute distress, but she appears rather tired.  VITAL SIGNS:  Max temperature has been 100.5, vital signs have been stable.  O2 saturations 96 to 97% on room air.  SKIN:  Normal.  HEENT:  Oropharynx is dry.  Eyes are normal externally with fundi not  examined.  NECK:  Supple without thyromegaly, carotid upstrokes firm without bruit.  LUNGS:  Unlabored and clear with decreased breath sounds.  HEART:  Regular without murmur.  ABDOMEN:  Obese, soft, nontender, with normal bowel sounds and no mass.  EXTREMITIES:  Pedal pulses present without edema.  NEUROLOGIC:  Without focal deficits.   LABORATORY DATA:  Notable for sodium 139, potassium 4, with repeat 3.2,  glucose 99 with repeat 117, CO2 34, BUN 26, creatinine 1.7 with repeat 1.5.  WBC 8.6, hemoglobin 13.4, 71% neutrophils.  Cardiac enzymes as above.  Urine  trace leukocyte esterase.   EKG shows left anterior fascicular block, otherwise normal and unchanged  from November 09, 1999, EKG from the office.   ASSESSMENT:  Nausea and vomiting for three days associated with dehydration,  low-grade fever.  This is probably viral gastritis.  She has substernal  discomfort which is most consistent with exacerbation of her  chronic  gastroesophageal reflux disease, but this needs to be watched since she also  has a slight elevation in troponin.  She is mildly hypokalemic.  Her diabetes is stable.   PLAN:  We will  admit to telemetry and repeat cardiac enzymes and EKG.  We  will continue IV rehydration.  She has already received 2 L in the emergency  room.  We will also replete the potassium.  Clear liquids as tolerated.  We  will try Mylanta as well as Protonix IV in addition to the Phenergan.  Otherwise, per admission orders.                                               Veverly Fells. Altheimer, M.D.    MDA/MEDQ  D:  03/01/2003  T:  03/02/2003  Job:  [id number]

## 2010-05-29 NOTE — Op Note (Signed)
NAME:  [name], [name] NO.:  [id number]   MEDICAL RECORD NO.:  [id number]                   PATIENT TYPE:  AMB   LOCATION:  DAY                                  FACILITY:  [facility]   PHYSICIAN:  Jamison Neighbor, M.D.               DATE OF BIRTH:  [birth year]   DATE OF PROCEDURE:  03/06/2003  DATE OF DISCHARGE:                                 OPERATIVE REPORT   PREOPERATIVE DIAGNOSIS:  Right ureteropelvic junction stone.   POSTOPERATIVE DIAGNOSIS:  Right ureteropelvic junction stone.   PROCEDURES:  1. Cystoscopy.  2. Right retrograde.  3. Right flexible and rigid ureteroscopy.  4. Right double J catheter insertion.   SURGEON:  Jamison Neighbor, M.D.   ANESTHESIA:  General.   COMPLICATIONS:  None.   DRAINS:  An 8 French x 26 cm double J catheter.   HISTORY:  This 65 year old female was first admitted to the hospital with  what was felt to be a viral gastroenteritis.  The patient had pain on the  right-hand side with associated nausea.  The patient felt a little better  after appropriate therapy and went home.  She subsequently returned to the  emergency room, where a CT scan showed what appeared to be an 8 mm right UPJ  stone as well as some 4 mm calcifications on the left-hand side.  The  patient has had some right-sided pain.  Urologic consultation was sought,  and it was felt that she should undergo ureteroscopy.  The patient is not a  candidate for ESWL because her weight exceeds 300 pounds.  The patient  understands the risks and benefits of the procedure and gave informed  consent.   DESCRIPTION OF PROCEDURE:  After successful induction of general anesthesia,  the patient was placed in the dorsal lithotomy position and prepped with  Betadine and draped in the usual sterile fashion.  Cystoscopy was performed.  The bladder was carefully inspected.  No tumors or stones could be seen.  The right retrograde study demonstrated a hydronephrotic  kidney but  otherwise a relatively unremarkable ureter.  No filling defect could be  seen.  It should be noted that due to her large body size, it was difficult  to actually identify a stone.  A guidewire was passed up to the kidney.  The  distal ureter was dilated with a balloon dilator.  The rigid ureteroscope  was inserted.  Because of the patient's large body habitus, this could not  be passed up to the UPJ.  The rigid ureteroscope was removed.  The ureteral  access sheath was then passed over the guidewire and up to the level of the  UPJ.  The wire was safely negotiated to the kidney.  The flexible  ureteroscope was then advanced through the ureteral access sheath and placed  into the kidney.  The upper portion of the collecting system was easily  identified.  The lower portion was more difficult to evaluate but the stone  was not easily visualized.  The access sheath and the flexible ureteroscope  were withdrawn and the entire ureter was inspected.  There was no evidence  of a stone anywhere along the ureter.  The ureter had been adequately  dilated by the access sheath, and it was felt that if the stone had slipped  back into the kidney, it should be able to pass with this dilated system.  The wire was left in place as the access sheath and scope were withdrawn as  a unit.  The entire ureter was inspected, and no obstruction, tumors,  stones, or other abnormalities could be identified.  The bladder was drained  and the scope was back-loaded over the wire.  An 8 French x 26 cm double J  was passed over the guidewire and coiled normally within the pelvis as well  as within the bladder.  This will be left in place for two to three weeks.  At that time a follow-up KUB will be obtained.  If the stone can be seen, a  decision will be made as to whether repeat ureteroscopy should be performed.  More than likely, however, the stent will be removed and the stone will be  allowed to pass.  If  it should get stuck in the distal ureter, then  certainly ureteroscopy would be simpler.                                               Jamison Neighbor, M.D.    RJE/MEDQ  D:  03/06/2003  T:  03/06/2003  Job:  [id number]   cc:   Veverly Fells. Altheimer, M.D.  [address]  [place]  [place] [address]  Fax: [phone]
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
## 2010-07-09 ENCOUNTER — Other Ambulatory Visit: Payer: Self-pay | Admitting: Internal Medicine

## 2010-07-09 ENCOUNTER — Ambulatory Visit
Admission: RE | Admit: 2010-07-09 | Discharge: 2010-07-09 | Disposition: A | Discharge status: Home / Self Care | Payer: Medicare Other | Source: Ambulatory Visit | Attending: Internal Medicine | Admitting: Internal Medicine

## 2010-07-09 DIAGNOSIS — R05 Cough: Secondary | ICD-10-CM

## 2010-10-01 LAB — POCT CARDIAC MARKERS
Myoglobin, poc: 163
Myoglobin, poc: 181
Operator id: 3206
Operator id: 3206

## 2010-10-01 LAB — CBC
HCT: 39.4
MCHC: 33.9
MCV: 85.2
RBC: 4.62
WBC: 6.1

## 2010-10-01 LAB — DIFFERENTIAL
Basophils Relative: 0
Eosinophils Absolute: 0
Eosinophils Relative: 1
Lymphs Abs: 1.8
Monocytes Relative: 7

## 2010-10-01 LAB — BASIC METABOLIC PANEL
BUN: 16
CO2: 34 — ABNORMAL HIGH
Chloride: 99
GFR calc Af Amer: >60
Potassium: 3.4 — ABNORMAL LOW

## 2010-11-20 ENCOUNTER — Emergency Department: Payer: Self-pay | Admitting: Emergency Medicine

## 2010-11-21 ENCOUNTER — Encounter (HOSPITAL_COMMUNITY): Payer: Self-pay | Admitting: *Deleted

## 2010-11-21 ENCOUNTER — Inpatient Hospital Stay (HOSPITAL_COMMUNITY): Payer: Medicare Other

## 2010-11-21 ENCOUNTER — Inpatient Hospital Stay (HOSPITAL_COMMUNITY)
Admission: EM | Admit: 2010-11-21 | Discharge: 2010-11-22 | DRG: 087 | Disposition: A | Discharge status: Home / Self Care | Payer: Medicare Other | Source: Other Acute Inpatient Hospital | Attending: Neurosurgery | Admitting: Neurosurgery

## 2010-11-21 DIAGNOSIS — Z794 Long term (current) use of insulin: Secondary | ICD-10-CM

## 2010-11-21 DIAGNOSIS — Z79899 Other long term (current) drug therapy: Secondary | ICD-10-CM

## 2010-11-21 DIAGNOSIS — S06339A Contusion and laceration of cerebrum, unspecified, with loss of consciousness of unspecified duration, initial encounter: Principal | ICD-10-CM | POA: Diagnosis present

## 2010-11-21 DIAGNOSIS — E039 Hypothyroidism, unspecified: Secondary | ICD-10-CM | POA: Diagnosis present

## 2010-11-21 DIAGNOSIS — F411 Generalized anxiety disorder: Secondary | ICD-10-CM | POA: Diagnosis present

## 2010-11-21 DIAGNOSIS — E119 Type 2 diabetes mellitus without complications: Secondary | ICD-10-CM | POA: Diagnosis present

## 2010-11-21 DIAGNOSIS — G4730 Sleep apnea, unspecified: Secondary | ICD-10-CM | POA: Diagnosis present

## 2010-11-21 DIAGNOSIS — I10 Essential (primary) hypertension: Secondary | ICD-10-CM | POA: Diagnosis present

## 2010-11-21 DIAGNOSIS — K219 Gastro-esophageal reflux disease without esophagitis: Secondary | ICD-10-CM | POA: Diagnosis present

## 2010-11-21 DIAGNOSIS — J45909 Unspecified asthma, uncomplicated: Secondary | ICD-10-CM | POA: Diagnosis present

## 2010-11-21 DIAGNOSIS — E785 Hyperlipidemia, unspecified: Secondary | ICD-10-CM | POA: Diagnosis present

## 2010-11-21 HISTORY — DX: Anxiety disorder, unspecified: F41.9

## 2010-11-21 HISTORY — DX: Sleep apnea, unspecified: G47.30

## 2010-11-21 HISTORY — DX: Hypothyroidism, unspecified: E03.9

## 2010-11-21 HISTORY — DX: Hyperlipidemia, unspecified: E78.5

## 2010-11-21 HISTORY — DX: Gastro-esophageal reflux disease without esophagitis: K21.9

## 2010-11-21 HISTORY — DX: Essential (primary) hypertension: I10

## 2010-11-21 LAB — CBC
Platelets: 182 10*3/uL (ref 150–400)
RDW: 14.5 % (ref 11.5–15.5)
WBC: 6.3 10*3/uL (ref 4.0–10.5)

## 2010-11-21 LAB — BASIC METABOLIC PANEL
Chloride: 100 mEq/L (ref 96–112)
GFR calc Af Amer: 67 mL/min — ABNORMAL LOW (ref >90)
Potassium: 3.4 mEq/L — ABNORMAL LOW (ref 3.5–5.1)

## 2010-11-21 LAB — GLUCOSE, CAPILLARY
Glucose-Capillary: 212 mg/dL — ABNORMAL HIGH (ref 70–99)
Glucose-Capillary: 263 mg/dL — ABNORMAL HIGH (ref 70–99)

## 2010-11-21 LAB — MRSA PCR SCREENING: MRSA by PCR: POSITIVE — AB

## 2010-11-21 MED ORDER — ONDANSETRON HCL 4 MG PO TABS: 4.0000 mg | ORAL_TABLET | Freq: Four times a day (QID) | ORAL | Status: DC | PRN

## 2010-11-21 MED ORDER — POTASSIUM CHLORIDE 20 MEQ PO PACK
40.0000 meq | PACK | Freq: Every day | ORAL | Status: DC
  Filled 2010-11-21: qty 2

## 2010-11-21 MED ORDER — CHLORHEXIDINE GLUCONATE CLOTH 2 % EX PADS
6.0000 | MEDICATED_PAD | Freq: Every day | CUTANEOUS | Status: DC
  Administered 2010-11-21: 6 via TOPICAL

## 2010-11-21 MED ORDER — HYDROCHLOROTHIAZIDE 25 MG PO TABS
25.0000 mg | ORAL_TABLET | Freq: Two times a day (BID) | ORAL | Status: DC
  Administered 2010-11-21: 25 mg via ORAL
  Filled 2010-11-21 (×5): qty 1

## 2010-11-21 MED ORDER — SODIUM CHLORIDE 0.9 % IJ SOLN: 3.0000 mL | Freq: Two times a day (BID) | INTRAMUSCULAR | Status: DC

## 2010-11-21 MED ORDER — HYDROCODONE-ACETAMINOPHEN 5-325 MG PO TABS
1.0000 | ORAL_TABLET | ORAL | Status: DC | PRN
  Administered 2010-11-21 (×2): 1 via ORAL
  Filled 2010-11-21 (×2): qty 1

## 2010-11-21 MED ORDER — INSULIN ASPART 100 UNIT/ML ~~LOC~~ SOLN
0.0000 [IU] | Freq: Three times a day (TID) | SUBCUTANEOUS | Status: DC
  Administered 2010-11-21: 8 [IU] via SUBCUTANEOUS
  Administered 2010-11-21 (×2): 5 [IU] via SUBCUTANEOUS
  Administered 2010-11-22: 2 [IU] via SUBCUTANEOUS
  Filled 2010-11-21: qty 3

## 2010-11-21 MED ORDER — INSULIN GLARGINE 100 UNIT/ML ~~LOC~~ SOLN
90.0000 [IU] | Freq: Every day | SUBCUTANEOUS | Status: DC
  Administered 2010-11-21: 90 [IU] via SUBCUTANEOUS
  Filled 2010-11-21: qty 3

## 2010-11-21 MED ORDER — SODIUM CHLORIDE 0.9 % IJ SOLN: 3.0000 mL | INTRAMUSCULAR | Status: DC | PRN

## 2010-11-21 MED ORDER — SODIUM CHLORIDE 0.9 % IV SOLN: 250.0000 mL | INTRAVENOUS | Status: DC

## 2010-11-21 MED ORDER — ALPRAZOLAM 0.25 MG PO TABS: 0.2500 mg | ORAL_TABLET | Freq: Every evening | ORAL | Status: DC | PRN

## 2010-11-21 MED ORDER — ONDANSETRON HCL 4 MG/2ML IJ SOLN: 4.0000 mg | Freq: Four times a day (QID) | INTRAMUSCULAR | Status: DC | PRN

## 2010-11-21 MED ORDER — ROSUVASTATIN CALCIUM 20 MG PO TABS
20.0000 mg | ORAL_TABLET | Freq: Every day | ORAL | Status: DC
  Administered 2010-11-21: 20 mg via ORAL
  Filled 2010-11-21 (×2): qty 1

## 2010-11-21 MED ORDER — POTASSIUM CHLORIDE CRYS ER 20 MEQ PO TBCR
40.0000 meq | EXTENDED_RELEASE_TABLET | Freq: Every day | ORAL | Status: DC
  Administered 2010-11-21: 40 meq via ORAL
  Filled 2010-11-21: qty 2

## 2010-11-21 MED ORDER — VITAMIN D (ERGOCALCIFEROL) 1.25 MG (50000 UNIT) PO CAPS
50000.0000 [IU] | ORAL_CAPSULE | ORAL | Status: DC
  Administered 2010-11-21: 50000 [IU] via ORAL
  Filled 2010-11-21: qty 1

## 2010-11-21 MED ORDER — CHLORHEXIDINE GLUCONATE CLOTH 2 % EX PADS: 6.0000 | MEDICATED_PAD | Freq: Every day | CUTANEOUS | Status: DC

## 2010-11-21 MED ORDER — MUPIROCIN 2 % EX OINT
1.0000 "application " | TOPICAL_OINTMENT | Freq: Two times a day (BID) | CUTANEOUS | Status: DC
  Administered 2010-11-21 (×2): 1 via NASAL
  Filled 2010-11-21: qty 22

## 2010-11-21 MED ORDER — ACETAMINOPHEN 325 MG PO TABS
650.0000 mg | ORAL_TABLET | ORAL | Status: DC | PRN
  Administered 2010-11-21: 650 mg via ORAL
  Filled 2010-11-21: qty 2

## 2010-11-21 MED ORDER — LEVOTHYROXINE SODIUM 175 MCG PO TABS
175.0000 ug | ORAL_TABLET | Freq: Every day | ORAL | Status: DC
  Administered 2010-11-21 – 2010-11-22 (×2): 175 ug via ORAL
  Filled 2010-11-21 (×3): qty 1

## 2010-11-21 MED ORDER — CALCIUM CARBONATE ANTACID 500 MG PO CHEW
400.0000 mg | CHEWABLE_TABLET | ORAL | Status: DC | PRN
  Administered 2010-11-21: 400 mg via ORAL
  Filled 2010-11-21 (×2): qty 2
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
## 2010-11-22 ENCOUNTER — Inpatient Hospital Stay (HOSPITAL_COMMUNITY): Payer: Medicare Other

## 2010-11-22 MED ORDER — WHITE PETROLATUM GEL
Status: AC
  Filled 2010-11-22: qty 5

## 2010-11-22 NOTE — Progress Notes (Signed)
Nursing note pt discharged to home, PIV discontinued catheter intact, pt and husband discussed discharge instructions and information, all questions answered.  Pt left via wheelchair
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
## 2011-01-15 ENCOUNTER — Emergency Department (HOSPITAL_COMMUNITY)
Admission: EM | Admit: 2011-01-15 | Discharge: 2011-01-16 | Disposition: A | Discharge status: Home / Self Care | Payer: Medicare Other | Attending: Emergency Medicine | Admitting: Emergency Medicine

## 2011-01-15 ENCOUNTER — Emergency Department (HOSPITAL_COMMUNITY): Payer: Medicare Other

## 2011-01-15 ENCOUNTER — Encounter (HOSPITAL_COMMUNITY): Payer: Self-pay | Admitting: *Deleted

## 2011-01-15 DIAGNOSIS — I10 Essential (primary) hypertension: Secondary | ICD-10-CM | POA: Insufficient documentation

## 2011-01-15 DIAGNOSIS — E039 Hypothyroidism, unspecified: Secondary | ICD-10-CM | POA: Insufficient documentation

## 2011-01-15 DIAGNOSIS — G4730 Sleep apnea, unspecified: Secondary | ICD-10-CM | POA: Insufficient documentation

## 2011-01-15 DIAGNOSIS — R51 Headache: Secondary | ICD-10-CM | POA: Insufficient documentation

## 2011-01-15 DIAGNOSIS — Z794 Long term (current) use of insulin: Secondary | ICD-10-CM | POA: Insufficient documentation

## 2011-01-15 DIAGNOSIS — E119 Type 2 diabetes mellitus without complications: Secondary | ICD-10-CM | POA: Insufficient documentation

## 2011-01-15 DIAGNOSIS — IMO0001 Reserved for inherently not codable concepts without codable children: Secondary | ICD-10-CM | POA: Insufficient documentation

## 2011-01-15 DIAGNOSIS — J111 Influenza due to unidentified influenza virus with other respiratory manifestations: Secondary | ICD-10-CM | POA: Insufficient documentation

## 2011-01-15 DIAGNOSIS — R599 Enlarged lymph nodes, unspecified: Secondary | ICD-10-CM | POA: Insufficient documentation

## 2011-01-15 DIAGNOSIS — J45909 Unspecified asthma, uncomplicated: Secondary | ICD-10-CM | POA: Insufficient documentation

## 2011-01-15 DIAGNOSIS — R5381 Other malaise: Secondary | ICD-10-CM | POA: Insufficient documentation

## 2011-01-15 DIAGNOSIS — R5383 Other fatigue: Secondary | ICD-10-CM | POA: Insufficient documentation

## 2011-01-15 DIAGNOSIS — R509 Fever, unspecified: Secondary | ICD-10-CM | POA: Insufficient documentation

## 2011-01-15 DIAGNOSIS — R1110 Vomiting, unspecified: Secondary | ICD-10-CM | POA: Insufficient documentation

## 2011-01-15 DIAGNOSIS — J3489 Other specified disorders of nose and nasal sinuses: Secondary | ICD-10-CM | POA: Insufficient documentation

## 2011-01-15 DIAGNOSIS — K219 Gastro-esophageal reflux disease without esophagitis: Secondary | ICD-10-CM | POA: Insufficient documentation

## 2011-01-15 DIAGNOSIS — E785 Hyperlipidemia, unspecified: Secondary | ICD-10-CM | POA: Insufficient documentation

## 2011-01-15 DIAGNOSIS — R059 Cough, unspecified: Secondary | ICD-10-CM | POA: Insufficient documentation

## 2011-01-15 DIAGNOSIS — R05 Cough: Secondary | ICD-10-CM | POA: Insufficient documentation

## 2011-01-15 LAB — CBC
HCT: 38.9 % (ref 36.0–46.0)
Hemoglobin: 12.7 g/dL (ref 12.0–15.0)
MCV: 90.3 fL (ref 78.0–100.0)
RBC: 4.31 MIL/uL (ref 3.87–5.11)
WBC: 5.2 10*3/uL (ref 4.0–10.5)

## 2011-01-15 LAB — DIFFERENTIAL
Eosinophils Relative: 0 % (ref 0–5)
Lymphocytes Relative: 20 % (ref 12–46)
Lymphs Abs: 1.1 10*3/uL (ref 0.7–4.0)
Monocytes Absolute: 0.6 10*3/uL (ref 0.1–1.0)

## 2011-01-15 LAB — BASIC METABOLIC PANEL
CO2: 31 mEq/L (ref 19–32)
Calcium: 9.6 mg/dL (ref 8.4–10.5)
Creatinine, Ser: 1.39 mg/dL — ABNORMAL HIGH (ref 0.50–1.10)
Glucose, Bld: 203 mg/dL — ABNORMAL HIGH (ref 70–99)
Sodium: 136 mEq/L (ref 135–145)

## 2011-01-15 MED ORDER — KETOROLAC TROMETHAMINE 30 MG/ML IJ SOLN
30.0000 mg | Freq: Once | INTRAMUSCULAR | Status: AC
  Administered 2011-01-15: 30 mg via INTRAMUSCULAR
  Filled 2011-01-15: qty 1

## 2011-01-15 MED ORDER — GI COCKTAIL ~~LOC~~
30.0000 mL | Freq: Once | ORAL | Status: AC
  Administered 2011-01-15: 30 mL via ORAL
  Filled 2011-01-15: qty 30

## 2011-01-15 MED ORDER — OSELTAMIVIR PHOSPHATE 75 MG PO CAPS: 75.0000 mg | ORAL_CAPSULE | Freq: Two times a day (BID) | ORAL | Status: AC

## 2011-01-15 MED ORDER — ACETAMINOPHEN 325 MG PO TABS
650.0000 mg | ORAL_TABLET | Freq: Once | ORAL | Status: AC
  Administered 2011-01-15: 650 mg via ORAL

## 2011-01-15 MED ORDER — OSELTAMIVIR PHOSPHATE 75 MG PO CAPS
75.0000 mg | ORAL_CAPSULE | Freq: Two times a day (BID) | ORAL | Status: DC
  Administered 2011-01-15: 75 mg via ORAL
  Filled 2011-01-15: qty 1

## 2011-01-15 MED ORDER — FAMOTIDINE 20 MG PO TABS
20.0000 mg | ORAL_TABLET | Freq: Once | ORAL | Status: AC
  Administered 2011-01-15: 20 mg via ORAL
  Filled 2011-01-15: qty 1

## 2011-01-15 MED ORDER — ACETAMINOPHEN 325 MG PO TABS
ORAL_TABLET | ORAL | Status: AC
  Filled 2011-01-15: qty 2

## 2011-01-15 MED ORDER — CHLORPHENIRAMINE-ACETAMINOPHEN 2-325 MG PO TABS: 2.0000 | ORAL_TABLET | Freq: Four times a day (QID) | ORAL | Status: DC | PRN

## 2011-01-15 MED ORDER — HYDROMORPHONE HCL PF 1 MG/ML IJ SOLN
1.0000 mg | Freq: Once | INTRAMUSCULAR | Status: DC
  Filled 2011-01-15: qty 1

## 2011-01-15 MED ORDER — HYDROCODONE-ACETAMINOPHEN 5-325 MG PO TABS: 2.0000 | ORAL_TABLET | ORAL | Status: AC | PRN

## 2011-01-15 NOTE — ED Provider Notes (Signed)
History     CSN: [id number]  Arrival date & time 01/15/11  [name]   First MD Initiated Contact with Patient 01/15/11 2141      Chief Complaint  Patient presents with  . Emesis    pt reports cough with vomiting and HA began last night. pt reports vomiting x's 2 in past 24hrs. reports hx of pneumonia.     (Consider location/radiation/quality/duration/timing/severity/associated sxs/prior treatment) Patient is a 66 y.o. female presenting with URI. The history is provided by the patient.  URI The primary symptoms include fever, fatigue, swollen glands, cough, vomiting and myalgias. Primary symptoms do not include headaches, ear pain, sore throat, wheezing, abdominal pain, nausea, arthralgias or rash. The current episode started yesterday. This is a new problem. The problem has been gradually worsening.  The fever began yesterday. The fever has been unchanged since its onset. The maximum temperature recorded prior to her arrival was 101 to 101.9 F. The temperature was taken by an oral thermometer.  The fatigue began yesterday. The fatigue has been unchanged since its onset.  The cough began yesterday. The cough is new. The cough is non-productive. There is nondescript sputum produced.  The vomiting began yesterday. Vomiting occurs 2 to 5 times per day (Posttussive emesis). The emesis contains stomach contents.  Symptoms associated with the illness include congestion and rhinorrhea. The illness is not associated with chills, plugged ear sensation, facial pain or sinus pressure.    Past Medical History  Diagnosis Date  . Sleep apnea     use CPAP nightly  . Asthma   . Hypothyroid   . Hypertension   . GERD (gastroesophageal reflux disease)   . Anxiety   . Diabetes mellitus   . Hyperlipidemia     Past Surgical History  Procedure Date  . Abdominal hysterectomy     History reviewed. No pertinent family history.  History  Substance Use Topics  . Smoking status: Never Smoker   .  Smokeless tobacco: Never Used  . Alcohol Use: No    OB History    Grav Para Term Preterm Abortions TAB SAB Ect Mult Living                  Review of Systems  Constitutional: Positive for fever and fatigue. Negative for chills.  HENT: Positive for congestion and rhinorrhea. Negative for ear pain, sore throat and sinus pressure.   Eyes: Negative.   Respiratory: Positive for cough. Negative for wheezing.   Cardiovascular: Negative for chest pain.  Gastrointestinal: Positive for vomiting. Negative for nausea, abdominal pain, diarrhea and blood in stool.  Genitourinary: Negative for dysuria.  Musculoskeletal: Positive for myalgias. Negative for arthralgias.  Skin: Negative for rash.  Neurological: Negative for headaches.  Hematological: Positive for adenopathy.  Psychiatric/Behavioral: Negative.     Allergies  Nexium; Avelox; Levaquin; and Lisinopril  Home Medications   Current Outpatient Rx  Name Route Sig Dispense Refill  . ALPRAZOLAM 0.25 MG PO TABS Oral Take 0.25 mg by mouth at bedtime as needed. For anxiety    . AMLODIPINE BESYLATE 10 MG PO TABS Oral Take 10 mg by mouth daily.      . ATORVASTATIN CALCIUM 40 MG PO TABS Oral Take 40 mg by mouth daily.      . ATACAND PO Oral Take 40 mg by mouth daily.     Marland Kitchen CLONIDINE HCL 0.1 MG/24HR TD PTWK Transdermal Place 1 patch onto the skin once a week. On Sundays     . HYDROCHLOROTHIAZIDE 25  MG PO TABS Oral Take 25 mg by mouth 2 (two) times daily.      . INSULIN GLARGINE 100 UNIT/ML [place] SOLN Subcutaneous Inject 85 Units into the skin at bedtime.     . INSULIN LISPRO (HUMAN) 100 UNIT/ML [place] SOLN Subcutaneous Inject 4-12 Units into the skin 3 (three) times daily before meals. Sliding scale, she decides how much to give depending on how much she eats    . LEVOTHYROXINE SODIUM 175 MCG PO TABS Oral Take 175 mcg by mouth daily.      Marland Kitchen METOPROLOL SUCCINATE ER 50 MG PO TB24 Oral Take 50 mg by mouth daily.      Marland Kitchen POTASSIUM CHLORIDE 20 MEQ PO PACK  Oral Take 40 mEq by mouth daily.      Marland Kitchen VITAMIN D (ERGOCALCIFEROL) 50000 UNITS PO CAPS Oral Take 50,000 Units by mouth every 7 (seven) days. monday      BP 136/69  Pulse 92  Temp(Src) 101.9 F (38.8 C) (Oral)  Resp 20  SpO2 93%  Physical Exam  Nursing note and vitals reviewed. Constitutional: She is oriented to person, place, and time. She appears well-developed and well-nourished. No distress.  HENT:  Head: Normocephalic and atraumatic.  Right Ear: External ear normal.  Left Ear: External ear normal.  Nose: Mucosal edema and rhinorrhea present. No sinus tenderness. Right sinus exhibits no maxillary sinus tenderness and no frontal sinus tenderness. Left sinus exhibits no maxillary sinus tenderness and no frontal sinus tenderness.  Mouth/Throat: Uvula is midline, oropharynx is clear and moist and mucous membranes are normal.  Eyes: Conjunctivae and EOM are normal. Pupils are equal, round, and reactive to light.  Neck: Normal range of motion. Neck supple. No JVD present. No tracheal deviation present.  Cardiovascular: Normal rate, regular rhythm, normal heart sounds and intact distal pulses.  Exam reveals no gallop and no friction rub.   No murmur heard. Pulmonary/Chest: Effort normal. No accessory muscle usage or stridor. Not tachypneic. No respiratory distress. She has no decreased breath sounds. She has no wheezes. She has no rhonchi. She has no rales. She exhibits no tenderness.  Abdominal: Soft. Bowel sounds are normal. She exhibits no distension. There is no tenderness. There is no rebound and no guarding.  Musculoskeletal: Normal range of motion. She exhibits no edema and no tenderness.  Lymphadenopathy:    She has no cervical adenopathy.  Neurological: She is alert and oriented to person, place, and time. She has normal reflexes. No cranial nerve deficit. She exhibits normal muscle tone. Coordination normal.  Skin: Skin is warm and dry. No rash noted. She is not diaphoretic. No  erythema. No pallor.  Psychiatric: She has a normal mood and affect. Her behavior is normal. Judgment and thought content normal.    ED Course  Procedures (including critical care time)  Labs Reviewed  BASIC METABOLIC PANEL - Abnormal; Notable for the following:    Potassium 3.2 (*)    Glucose, Bld 203 (*)    Creatinine, Ser 1.39 (*)    GFR calc non Af Amer 39 (*)    GFR calc Af Amer 45 (*)    All other components within normal limits  CBC  DIFFERENTIAL  INFLUENZA PANEL BY PCR   Dg Chest 2 View  01/15/2011  *RADIOLOGY REPORT*  Clinical Data: Cough, fever, left-sided rib pain.  CHEST - 2 VIEW  Comparison: 07/09/2010  Findings: The heart size upper normal limits to mildly enlarged, mild central vascular congestion.  Bibasilar opacities, linear on  the left.  No pneumothorax.  Multilevel degenerative changes.  No acute osseous abnormality identified.  IMPRESSION: Interstitial prominence, a nonspecific finding that may represent a viral infection or edema.  Mild bibasilar opacities; atelectasis versus infiltrate.  Original Report Authenticated By: Waneta Martins, M.D.    No diagnosis found.    MDM   I have reviewed the aforementioned images and their interpretation by the radiologist and I see no apparent acute focal infiltrate to suggest pneumonia. A viral process does indeed appear to be at play both radiographically and clinically, and I believe that the patient has influenza. She appears stable for outpatient management of influenza with followup by her primary care physician.       Felisa Bonier, MD 01/15/11 2351

## 2011-01-15 NOTE — ED Notes (Signed)
Pt is diabetic and felt as if her sugar was dropping, CBG taken result 213

## 2011-01-15 NOTE — ED Notes (Signed)
MD at bedside. 

## 2011-01-15 NOTE — ED Notes (Signed)
Blood work drawn and sent to lab.

## 2011-01-16 NOTE — ED Notes (Signed)
MD at bedside. 

## 2011-01-19 LAB — GLUCOSE, CAPILLARY

## 2011-08-16 ENCOUNTER — Emergency Department (HOSPITAL_COMMUNITY)
Admission: EM | Admit: 2011-08-16 | Discharge: 2011-08-16 | Disposition: A | Discharge status: Home / Self Care | Payer: Medicare Other | Attending: Emergency Medicine | Admitting: Emergency Medicine

## 2011-08-16 ENCOUNTER — Encounter (HOSPITAL_COMMUNITY): Payer: Self-pay | Admitting: Emergency Medicine

## 2011-08-16 DIAGNOSIS — J069 Acute upper respiratory infection, unspecified: Secondary | ICD-10-CM

## 2011-08-16 DIAGNOSIS — Z90710 Acquired absence of both cervix and uterus: Secondary | ICD-10-CM | POA: Insufficient documentation

## 2011-08-16 DIAGNOSIS — I10 Essential (primary) hypertension: Secondary | ICD-10-CM | POA: Insufficient documentation

## 2011-08-16 DIAGNOSIS — J329 Chronic sinusitis, unspecified: Secondary | ICD-10-CM

## 2011-08-16 DIAGNOSIS — E119 Type 2 diabetes mellitus without complications: Secondary | ICD-10-CM | POA: Insufficient documentation

## 2011-08-16 DIAGNOSIS — G4730 Sleep apnea, unspecified: Secondary | ICD-10-CM | POA: Insufficient documentation

## 2011-08-16 DIAGNOSIS — J45909 Unspecified asthma, uncomplicated: Secondary | ICD-10-CM | POA: Insufficient documentation

## 2011-08-16 DIAGNOSIS — K219 Gastro-esophageal reflux disease without esophagitis: Secondary | ICD-10-CM | POA: Insufficient documentation

## 2011-08-16 DIAGNOSIS — E785 Hyperlipidemia, unspecified: Secondary | ICD-10-CM | POA: Insufficient documentation

## 2011-08-16 DIAGNOSIS — F411 Generalized anxiety disorder: Secondary | ICD-10-CM | POA: Insufficient documentation

## 2011-08-16 DIAGNOSIS — E039 Hypothyroidism, unspecified: Secondary | ICD-10-CM | POA: Insufficient documentation

## 2011-08-16 MED ORDER — AZITHROMYCIN 250 MG PO TABS
500.0000 mg | ORAL_TABLET | Freq: Once | ORAL | Status: AC
  Administered 2011-08-16: 500 mg via ORAL
  Filled 2011-08-16 (×2): qty 1

## 2011-08-16 MED ORDER — OXYMETAZOLINE HCL 0.05 % NA SOLN
1.0000 | Freq: Once | NASAL | Status: AC
  Administered 2011-08-16: 1 via NASAL
  Filled 2011-08-16: qty 15

## 2011-08-16 MED ORDER — ONDANSETRON 4 MG PO TBDP: 4.0000 mg | ORAL_TABLET | Freq: Three times a day (TID) | ORAL | Status: AC | PRN

## 2011-08-16 MED ORDER — AZITHROMYCIN 250 MG PO TABS: 250.0000 mg | ORAL_TABLET | Freq: Every day | ORAL | Status: AC

## 2011-08-16 MED ORDER — ALBUTEROL SULFATE HFA 108 (90 BASE) MCG/ACT IN AERS
2.0000 | INHALATION_SPRAY | Freq: Once | RESPIRATORY_TRACT | Status: AC
  Administered 2011-08-16: 2 via RESPIRATORY_TRACT
  Filled 2011-08-16: qty 6.7

## 2011-08-16 NOTE — ED Notes (Signed)
Pt alert, arrives from home, c/o cough and SOB, hx of asthma, states "I just feel tight", pt states home remedies without relief, resp even, mild labored

## 2011-08-16 NOTE — ED Provider Notes (Signed)
History     CSN: [id number]  Arrival date & time 08/16/11  0436   First MD Initiated Contact with Patient 08/16/11 [phone]      Chief Complaint  Patient presents with  . Shortness of Breath    (Consider location/radiation/quality/duration/timing/severity/associated sxs/prior treatment) HPI Comments: DM, Htn, hypothyroid and GERD, states that 2 days ago she started having sore throat =- developed into nasal congestion, and is having some bloody d/c with mucous from teh nost.  Has been coughing but is a dry cough.  Cough is gradually getting worse.  No smoking histroy, no RAD hx.  Has hx of frquent pneumonias.  Sx are mild to moderate at this time.  Patient is a 66 y.o. female presenting with shortness of breath. The history is provided by the patient.  Shortness of Breath  Associated symptoms include rhinorrhea, sore throat, cough and shortness of breath. Pertinent negatives include no chest pain and no fever.    Past Medical History  Diagnosis Date  . Sleep apnea     use CPAP nightly  . Asthma   . Hypothyroid   . Hypertension   . GERD (gastroesophageal reflux disease)   . Anxiety   . Diabetes mellitus   . Hyperlipidemia     Past Surgical History  Procedure Date  . Abdominal hysterectomy     No family history on file.  History  Substance Use Topics  . Smoking status: Never Smoker   . Smokeless tobacco: Never Used  . Alcohol Use: No    OB History    Grav Para Term Preterm Abortions TAB SAB Ect Mult Living                  Review of Systems  Constitutional: Negative for fever and chills.  HENT: Positive for congestion, sore throat, rhinorrhea and sinus pressure. Negative for postnasal drip.   Respiratory: Positive for cough and shortness of breath.   Cardiovascular: Negative for chest pain and leg swelling.  Gastrointestinal: Negative for nausea and vomiting.  Skin: Negative for rash.  Neurological: Negative for headaches.  Hematological: Negative for  adenopathy.    Allergies  Esomeprazole magnesium; Avelox; Levofloxacin; and Lisinopril  Home Medications   Current Outpatient Rx  Name Route Sig Dispense Refill  . ALPRAZOLAM 0.25 MG PO TABS Oral Take 0.25 mg by mouth at bedtime as needed. For anxiety    . AMLODIPINE BESYLATE 10 MG PO TABS Oral Take 10 mg by mouth daily.      . ATORVASTATIN CALCIUM 40 MG PO TABS Oral Take 40 mg by mouth daily.      . AZITHROMYCIN 250 MG PO TABS Oral Take 1 tablet (250 mg total) by mouth daily. 500mg  PO day 1, then 250mg  PO days 205 6 tablet 0  . ATACAND PO Oral Take 40 mg by mouth daily.     Marland Kitchen CLONIDINE HCL 0.1 MG/24HR TD PTWK Transdermal Place 1 patch onto the skin once a week. On Sundays     . HYDROCHLOROTHIAZIDE 25 MG PO TABS Oral Take 25 mg by mouth 2 (two) times daily.      . INSULIN GLARGINE 100 UNIT/ML [place] SOLN Subcutaneous Inject 85 Units into the skin at bedtime.     . INSULIN LISPRO (HUMAN) 100 UNIT/ML [place] SOLN Subcutaneous Inject 4-12 Units into the skin 3 (three) times daily before meals. Sliding scale, she decides how much to give depending on how much she eats    . LEVOTHYROXINE SODIUM 175 MCG PO TABS  Oral Take 175 mcg by mouth daily.      Marland Kitchen METOPROLOL SUCCINATE ER 50 MG PO TB24 Oral Take 50 mg by mouth daily.      Marland Kitchen ONDANSETRON 4 MG PO TBDP Oral Take 1 tablet (4 mg total) by mouth every 8 (eight) hours as needed for nausea. 10 tablet 0  . POTASSIUM CHLORIDE 20 MEQ PO PACK Oral Take 40 mEq by mouth daily.      Marland Kitchen VITAMIN D (ERGOCALCIFEROL) 50000 UNITS PO CAPS Oral Take 50,000 Units by mouth every 7 (seven) days. monday      BP 125/70  Pulse 71  Temp 98.4 F (36.9 C) (Oral)  Resp 15  SpO2 100%  Physical Exam  Nursing note and vitals reviewed. Constitutional: She appears well-developed and well-nourished. No distress.  HENT:  Head: Normocephalic and atraumatic.  Mouth/Throat: Oropharynx is clear and moist. No oropharyngeal exudate.       Turbinate swelling with d/c bilaterally, OP  clear and moist  Eyes: Conjunctivae and EOM are normal. Pupils are equal, round, and reactive to light. Right eye exhibits no discharge. Left eye exhibits no discharge. No scleral icterus.  Neck: Normal range of motion. Neck supple. No JVD present. No thyromegaly present.  Cardiovascular: Normal rate, regular rhythm, normal heart sounds and intact distal pulses.  Exam reveals no gallop and no friction rub.   No murmur heard. Pulmonary/Chest: Effort normal and breath sounds normal. No respiratory distress. She has no wheezes. She has no rales.  Abdominal: Soft. Bowel sounds are normal. She exhibits no distension and no mass. There is no tenderness.  Musculoskeletal: Normal range of motion. She exhibits no edema and no tenderness.  Lymphadenopathy:    She has no cervical adenopathy.  Neurological: She is alert. Coordination normal.  Skin: Skin is warm and dry. No rash noted. No erythema.  Psychiatric: She has a normal mood and affect. Her behavior is normal.    ED Course  Procedures (including critical care time)  Labs Reviewed  GLUCOSE, CAPILLARY - Abnormal; Notable for the following:    Glucose-Capillary 199 (*)     All other components within normal limits   No results found.   1. Sinusitis   2. URI (upper respiratory infection)       MDM  The patient's oxygen levels are 95% on room air without any distress and no abnormal lung sounds. She has sinusitis with upper respiratory infection. We'll treat with Zithromax, albuterol, Afrin, Zofran when necessary nausea. The patient is in agreement with this plan appear stable for discharge. She is not febrile, there is no tachycardia or hypotension.   Discharge Prescriptions include:  Zofran Zithromax Albuterol MDI Afrin  Vida Roller, MD 08/16/11 [phone]
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
## 2012-08-04 ENCOUNTER — Other Ambulatory Visit: Payer: Self-pay | Admitting: Gastroenterology

## 2012-08-07 ENCOUNTER — Encounter (HOSPITAL_COMMUNITY): Payer: Self-pay | Admitting: *Deleted

## 2012-08-07 ENCOUNTER — Encounter (HOSPITAL_COMMUNITY): Payer: Self-pay | Admitting: Pharmacy Technician

## 2012-08-29 ENCOUNTER — Ambulatory Visit (HOSPITAL_COMMUNITY)
Admission: RE | Admit: 2012-08-29 | Discharge: 2012-08-29 | Disposition: A | Discharge status: Home / Self Care | Payer: Medicare Other | Source: Ambulatory Visit | Attending: Gastroenterology | Admitting: Gastroenterology

## 2012-08-29 ENCOUNTER — Encounter (HOSPITAL_COMMUNITY): Payer: Self-pay | Admitting: Anesthesiology

## 2012-08-29 ENCOUNTER — Ambulatory Visit (HOSPITAL_COMMUNITY): Payer: Medicare Other | Admitting: Anesthesiology

## 2012-08-29 ENCOUNTER — Encounter (HOSPITAL_COMMUNITY)
Admission: RE | Disposition: A | Discharge status: Home / Self Care | Payer: Self-pay | Source: Ambulatory Visit | Attending: Gastroenterology

## 2012-08-29 ENCOUNTER — Encounter (HOSPITAL_COMMUNITY): Payer: Self-pay | Admitting: *Deleted

## 2012-08-29 DIAGNOSIS — G4730 Sleep apnea, unspecified: Secondary | ICD-10-CM | POA: Insufficient documentation

## 2012-08-29 DIAGNOSIS — I10 Essential (primary) hypertension: Secondary | ICD-10-CM | POA: Insufficient documentation

## 2012-08-29 DIAGNOSIS — E7800 Pure hypercholesterolemia, unspecified: Secondary | ICD-10-CM | POA: Insufficient documentation

## 2012-08-29 DIAGNOSIS — Z1211 Encounter for screening for malignant neoplasm of colon: Secondary | ICD-10-CM | POA: Insufficient documentation

## 2012-08-29 DIAGNOSIS — E890 Postprocedural hypothyroidism: Secondary | ICD-10-CM | POA: Insufficient documentation

## 2012-08-29 DIAGNOSIS — E1139 Type 2 diabetes mellitus with other diabetic ophthalmic complication: Secondary | ICD-10-CM | POA: Insufficient documentation

## 2012-08-29 DIAGNOSIS — D126 Benign neoplasm of colon, unspecified: Secondary | ICD-10-CM | POA: Insufficient documentation

## 2012-08-29 DIAGNOSIS — E11319 Type 2 diabetes mellitus with unspecified diabetic retinopathy without macular edema: Secondary | ICD-10-CM | POA: Insufficient documentation

## 2012-08-29 DIAGNOSIS — K219 Gastro-esophageal reflux disease without esophagitis: Secondary | ICD-10-CM | POA: Insufficient documentation

## 2012-08-29 HISTORY — DX: Unspecified osteoarthritis, unspecified site: M19.90

## 2012-08-29 HISTORY — PX: COLONOSCOPY WITH PROPOFOL: SHX5780

## 2012-08-29 LAB — GLUCOSE, CAPILLARY: Glucose-Capillary: 120 mg/dL — ABNORMAL HIGH (ref 70–99)

## 2012-08-29 SURGERY — COLONOSCOPY WITH PROPOFOL
Anesthesia: Monitor Anesthesia Care

## 2012-08-29 MED ORDER — SODIUM CHLORIDE 0.9 % IV SOLN: INTRAVENOUS | Status: DC

## 2012-08-29 MED ORDER — FENTANYL CITRATE 0.05 MG/ML IJ SOLN
INTRAMUSCULAR | Status: DC | PRN
  Administered 2012-08-29 (×2): 50 ug via INTRAVENOUS

## 2012-08-29 MED ORDER — KETAMINE HCL 10 MG/ML IJ SOLN
INTRAMUSCULAR | Status: DC | PRN
  Administered 2012-08-29: 15 mg via INTRAVENOUS

## 2012-08-29 MED ORDER — MIDAZOLAM HCL 5 MG/5ML IJ SOLN
INTRAMUSCULAR | Status: DC | PRN
  Administered 2012-08-29 (×2): 1 mg via INTRAVENOUS

## 2012-08-29 MED ORDER — PROPOFOL INFUSION 10 MG/ML OPTIME
INTRAVENOUS | Status: DC | PRN
  Administered 2012-08-29: 140 ug/kg/min via INTRAVENOUS

## 2012-08-29 MED ORDER — LACTATED RINGERS IV SOLN
INTRAVENOUS | Status: DC
  Administered 2012-08-29: 1000 mL via INTRAVENOUS

## 2012-08-29 MED ORDER — PROMETHAZINE HCL 25 MG/ML IJ SOLN: 6.2500 mg | INTRAMUSCULAR | Status: DC | PRN

## 2012-08-29 SURGICAL SUPPLY — 21 items

## 2012-08-29 NOTE — Op Note (Signed)
Procedure: Baseline screening colonoscopy  Endoscopist: Danise Edge  Premedication: Propofol administered by anesthesia  Procedure: The patient was placed in the left lateral decubitus position. Anal inspection and digital rectal exam were normal. The Pentax pediatric colonoscope was introduced into the rectum and advanced to the cecum. A normal-appearing ileocecal valve and appendiceal orifice were identified. Colonic preparation for the exam today was good.  Rectum. Normal. Retroflexed view of the distal rectum normal.  Sigmoid colon and descending colon. From the descending colon two 4 mm sessile polyps were removed with the cold snare and submitted for pathologic patient  Splenic flexure. Normal.  Transverse colon. From the mid transverse colon a 3 mm sessile polyp was removed with the cold biopsy forceps.  Hepatic flexure. Normal.  Ascending colon. Normal.  Cecum and ileocecal valve. Normal.  Assessment:  #1. From the mid transverse colon a 3 mm sessile polyp was removed with the cold biopsy forceps  #2. From the descending colon, two 4 mm sessile polyps were removed with the cold snare.  Recommendations: If colon polyp returns neoplastic pathologically, the patient should undergo a surveillance colonoscopy in 5 years. If the polyps returned nonneoplastic pathologically, the patient should undergo a repeat screening colonoscopy in 10 years

## 2012-08-29 NOTE — Transfer of Care (Signed)
Immediate Anesthesia Transfer of Care Note  Patient: [name]  Procedure(s) Performed: Procedure(s): COLONOSCOPY WITH PROPOFOL (N/A)  Patient Location: PACU  Anesthesia Type:MAC  Level of Consciousness: awake, alert  and oriented  Airway & Oxygen Therapy: Patient Spontanous Breathing  Post-op Assessment: Report given to PACU RN and Post -op Vital signs reviewed and stable  Post vital signs: Reviewed and stable  Complications: No apparent anesthesia complications

## 2012-08-29 NOTE — H&P (Signed)
  Procedure: Baseline screening colonoscopy. Severe sleep apnea syndrome.  History: The patient is a 67 year old female born 16-Sep-1945. The patient is scheduled to undergo her first screening colonoscopy with polypectomy to prevent colon cancer.  Past medical history: Type 2 diabetes mellitus with obesity. Hypercholesterolemia. Protein urea. Diabetic retinopathy. Hypertension. Hypothyroidism following radioactive iodine treatment for a multinodular goiter to vitamin D. deficiency. Sleep apnea syndrome. Laparoscopic cholecystectomy. Irritable bowel syndrome. Total abdominal hysterectomy with bilateral salpingo-oophorectomy. Kidney stones. Recurrent urinary tract infections. Carpal tunnel syndrome. Chronic back pain with sciatica. Chronic fatigue syndrome. Chronic anxiety with panic attacks. Depression. Left cataract surgery. Vitrectomy.  Exam: The patient is alert and lying comfortably on the endoscopy stretcher. Abdomen is soft and nontender to palpation. Lungs are clear to auscultation. Cardiac exam reveals a regular rhythm.  Plan: Proceed with baseline screening colonoscopy

## 2012-08-29 NOTE — Anesthesia Preprocedure Evaluation (Addendum)
Anesthesia Evaluation  Patient identified by MRN, date of birth, ID band Patient awake    Reviewed: Allergy & Precautions, H&P , NPO status , Patient's Chart, lab work & pertinent test results  Airway Mallampati: III TM Distance: >3 FB     Dental  (+) Teeth Intact and Dental Advisory Given   Pulmonary neg pulmonary ROS, asthma , sleep apnea and Continuous Positive Airway Pressure Ventilation ,  breath sounds clear to auscultation  Pulmonary exam normal       Cardiovascular hypertension, Pt. on medications and Pt. on home beta blockers Rhythm:Regular Rate:Normal     Neuro/Psych negative neurological ROS  negative psych ROS   GI/Hepatic negative GI ROS, Neg liver ROS, GERD-  Medicated,  Endo/Other  diabetes, Type 2, Insulin DependentHypothyroidism Morbid obesity  Renal/GU negative Renal ROS  negative genitourinary   Musculoskeletal negative musculoskeletal ROS (+)   Abdominal   Peds negative pediatric ROS (+)  Hematology negative hematology ROS (+)   Anesthesia Other Findings   Reproductive/Obstetrics negative OB ROS                          Anesthesia Physical Anesthesia Plan  ASA: III  Anesthesia Plan: MAC   Post-op Pain Management:    Induction: Intravenous  Airway Management Planned: Simple Face Mask  Additional Equipment:   Intra-op Plan:   Post-operative Plan: Extubation in OR  Informed Consent: I have reviewed the patients History and Physical, chart, labs and discussed the procedure including the risks, benefits and alternatives for the proposed anesthesia with the patient or authorized representative who has indicated his/her understanding and acceptance.   Dental advisory given  Plan Discussed with:   Anesthesia Plan Comments:         Anesthesia Quick Evaluation

## 2012-08-29 NOTE — Preoperative (Signed)
Beta Blockers   Reason not to administer Beta Blockers:Not Applicable 

## 2012-08-30 ENCOUNTER — Encounter (HOSPITAL_COMMUNITY): Payer: Self-pay | Admitting: Gastroenterology

## 2012-08-30 NOTE — Anesthesia Postprocedure Evaluation (Signed)
Anesthesia Post Note  Patient: [name]  Procedure(s) Performed: Procedure(s) (LRB): COLONOSCOPY WITH PROPOFOL (N/A)  Anesthesia type: MAC  Patient location: PACU  Post pain: Pain level controlled  Post assessment: Post-op Vital signs reviewed  Last Vitals:  Filed Vitals:   08/29/12 1234  BP: 110/75  Pulse: 66  Temp: 36.7 C  Resp: 12    Post vital signs: Reviewed  Level of consciousness: sedated  Complications: No apparent anesthesia complications

## 2012-12-18 ENCOUNTER — Ambulatory Visit (INDEPENDENT_AMBULATORY_CARE_PROVIDER_SITE_OTHER): Payer: Medicare Other | Admitting: Podiatry

## 2012-12-18 ENCOUNTER — Encounter: Payer: Self-pay | Admitting: Podiatry

## 2012-12-18 VITALS — BP 140/73 | HR 65 | Resp 16 | Ht 67.5 in | Wt 250.0 lb

## 2012-12-18 DIAGNOSIS — M79609 Pain in unspecified limb: Secondary | ICD-10-CM

## 2012-12-18 DIAGNOSIS — B351 Tinea unguium: Secondary | ICD-10-CM

## 2012-12-18 NOTE — Progress Notes (Signed)
Patient ID: [name], female   DOB: 08/17/1945, 67 y.o.   MRN: [id number]  Subjective: This 67-year-old black diabetic female presents for ongoing debridement of painful mycotic toenails. She's been a patient of practice since 2005. Her last visit was 09/25/2012.   Objective: Elongated, hypertrophic, discolored, incurvated toenails with palpable tenderness in all 10 nail plates .  Assessment: Symptomatic onychomycoses x10   Plan: All 10 toenails are debrided back down to bleeding. Reappoint at three-month intervals.  

## 2013-02-19 ENCOUNTER — Encounter (HOSPITAL_COMMUNITY): Payer: Self-pay | Admitting: Emergency Medicine

## 2013-02-19 ENCOUNTER — Emergency Department (HOSPITAL_COMMUNITY)
Admission: EM | Admit: 2013-02-19 | Discharge: 2013-02-19 | Disposition: A | Discharge status: Home / Self Care | Payer: Medicare Other | Attending: Emergency Medicine | Admitting: Emergency Medicine

## 2013-02-19 DIAGNOSIS — Z794 Long term (current) use of insulin: Secondary | ICD-10-CM | POA: Insufficient documentation

## 2013-02-19 DIAGNOSIS — E119 Type 2 diabetes mellitus without complications: Secondary | ICD-10-CM | POA: Insufficient documentation

## 2013-02-19 DIAGNOSIS — K219 Gastro-esophageal reflux disease without esophagitis: Secondary | ICD-10-CM | POA: Insufficient documentation

## 2013-02-19 DIAGNOSIS — M129 Arthropathy, unspecified: Secondary | ICD-10-CM | POA: Insufficient documentation

## 2013-02-19 DIAGNOSIS — F411 Generalized anxiety disorder: Secondary | ICD-10-CM | POA: Insufficient documentation

## 2013-02-19 DIAGNOSIS — I10 Essential (primary) hypertension: Secondary | ICD-10-CM | POA: Insufficient documentation

## 2013-02-19 DIAGNOSIS — Z79899 Other long term (current) drug therapy: Secondary | ICD-10-CM | POA: Insufficient documentation

## 2013-02-19 DIAGNOSIS — H9319 Tinnitus, unspecified ear: Secondary | ICD-10-CM | POA: Insufficient documentation

## 2013-02-19 DIAGNOSIS — J329 Chronic sinusitis, unspecified: Secondary | ICD-10-CM

## 2013-02-19 DIAGNOSIS — G4730 Sleep apnea, unspecified: Secondary | ICD-10-CM | POA: Insufficient documentation

## 2013-02-19 DIAGNOSIS — Z7982 Long term (current) use of aspirin: Secondary | ICD-10-CM | POA: Insufficient documentation

## 2013-02-19 DIAGNOSIS — E785 Hyperlipidemia, unspecified: Secondary | ICD-10-CM | POA: Insufficient documentation

## 2013-02-19 DIAGNOSIS — E039 Hypothyroidism, unspecified: Secondary | ICD-10-CM | POA: Insufficient documentation

## 2013-02-19 DIAGNOSIS — J45909 Unspecified asthma, uncomplicated: Secondary | ICD-10-CM | POA: Insufficient documentation

## 2013-02-19 MED ORDER — CLARITHROMYCIN 500 MG PO TABS: 500.0000 mg | ORAL_TABLET | Freq: Two times a day (BID) | ORAL | Status: DC

## 2013-02-19 NOTE — Discharge Instructions (Signed)
Use Afrin (oxymetazoline) 1 spray, twice a day, in each nostril for 3 or 4 days. Use the antibiotic as prescribed. Talk to your doctor about getting pulmonary function testing done. Ask your doctor about your oxygen saturation that varied daily in between 88 and 97%. The hearing abnormality may improve with the antibiotic. If it persists, ask your doctor about it, in one or 2 weeks   Sinusitis Sinusitis is redness, soreness, and swelling (inflammation) of the paranasal sinuses. Paranasal sinuses are air pockets within the bones of your face (beneath the eyes, the middle of the forehead, or above the eyes). In healthy paranasal sinuses, mucus is able to drain out, and air is able to circulate through them by way of your nose. However, when your paranasal sinuses are inflamed, mucus and air can become trapped. This can allow bacteria and other germs to grow and cause infection. Sinusitis can develop quickly and last only a short time (acute) or continue over a long period (chronic). Sinusitis that lasts for more than 12 weeks is considered chronic.  CAUSES  Causes of sinusitis include:  Allergies.  Structural abnormalities, such as displacement of the cartilage that separates your nostrils (deviated septum), which can decrease the air flow through your nose and sinuses and affect sinus drainage.  Functional abnormalities, such as when the small hairs (cilia) that line your sinuses and help remove mucus do not work properly or are not present. SYMPTOMS  Symptoms of acute and chronic sinusitis are the same. The primary symptoms are pain and pressure around the affected sinuses. Other symptoms include:  Upper toothache.  Earache.  Headache.  Bad breath.  Decreased sense of smell and taste.  A cough, which worsens when you are lying flat.  Fatigue.  Fever.  Thick drainage from your nose, which often is green and may contain pus (purulent).  Swelling and warmth over the affected  sinuses. DIAGNOSIS  Your caregiver will perform a physical exam. During the exam, your caregiver may:  Look in your nose for signs of abnormal growths in your nostrils (nasal polyps).  Tap over the affected sinus to check for signs of infection.  View the inside of your sinuses (endoscopy) with a special imaging device with a light attached (endoscope), which is inserted into your sinuses. If your caregiver suspects that you have chronic sinusitis, one or more of the following tests may be recommended:  Allergy tests.  Nasal culture A sample of mucus is taken from your nose and sent to a lab and screened for bacteria.  Nasal cytology A sample of mucus is taken from your nose and examined by your caregiver to determine if your sinusitis is related to an allergy. TREATMENT  Most cases of acute sinusitis are related to a viral infection and will resolve on their own within 10 days. Sometimes medicines are prescribed to help relieve symptoms (pain medicine, decongestants, nasal steroid sprays, or saline sprays).  However, for sinusitis related to a bacterial infection, your caregiver will prescribe antibiotic medicines. These are medicines that will help kill the bacteria causing the infection.  Rarely, sinusitis is caused by a fungal infection. In theses cases, your caregiver will prescribe antifungal medicine. For some cases of chronic sinusitis, surgery is needed. Generally, these are cases in which sinusitis recurs more than 3 times per year, despite other treatments. HOME CARE INSTRUCTIONS   Drink plenty of water. Water helps thin the mucus so your sinuses can drain more easily.  Use a humidifier.  Inhale steam  3 to 4 times a day (for example, sit in the bathroom with the shower running).  Apply a warm, moist washcloth to your face 3 to 4 times a day, or as directed by your caregiver.  Use saline nasal sprays to help moisten and clean your sinuses.  Take over-the-counter or  prescription medicines for pain, discomfort, or fever only as directed by your caregiver. SEEK IMMEDIATE MEDICAL CARE IF:  You have increasing pain or severe headaches.  You have nausea, vomiting, or drowsiness.  You have swelling around your face.  You have vision problems.  You have a stiff neck.  You have difficulty breathing. MAKE SURE YOU:   Understand these instructions.  Will watch your condition.  Will get help right away if you are not doing well or get worse. Document Released: 12/28/2004 Document Revised: 03/22/2011 Document Reviewed: 01/12/2011 [facility] Patient Information 2014 [place], [place].  Sleep Apnea  Sleep apnea is a sleep disorder characterized by abnormal pauses in breathing while you sleep. When your breathing pauses, the level of oxygen in your blood decreases. This causes you to move out of deep sleep and into light sleep. As a result, your quality of sleep is poor, and the system that carries your blood throughout your body (cardiovascular system) experiences stress. If sleep apnea remains untreated, the following conditions can develop:  High blood pressure (hypertension).  Coronary artery disease.  Inability to achieve or maintain an erection (impotence).  Impairment of your thought process (cognitive dysfunction). There are three types of sleep apnea: 1. Obstructive sleep apnea Pauses in breathing during sleep because of a blocked airway. 2. Central sleep apnea Pauses in breathing during sleep because the area of the brain that controls your breathing does not send the correct signals to the muscles that control breathing. 3. Mixed sleep apnea A combination of both obstructive and central sleep apnea. RISK FACTORS The following risk factors can increase your risk of developing sleep apnea:  Being overweight.  Smoking.  Having narrow passages in your nose and throat.  Being of older age.  Being female.  Alcohol use.  Sedative and  tranquilizer use.  Ethnicity. Among individuals younger than 35 years, African Americans are at increased risk of sleep apnea. SYMPTOMS   Difficulty staying asleep.  Daytime sleepiness and fatigue.  Loss of energy.  Irritability.  Loud, heavy snoring.  Morning headaches.  Trouble concentrating.  Forgetfulness.  Decreased interest in sex. DIAGNOSIS  In order to diagnose sleep apnea, your caregiver will perform a physical examination. Your caregiver may suggest that you take a home sleep test. Your caregiver may also recommend that you spend the night in a sleep lab. In the sleep lab, several monitors record information about your heart, lungs, and brain while you sleep. Your leg and arm movements and blood oxygen level are also recorded. TREATMENT The following actions may help to resolve mild sleep apnea:  Sleeping on your side.   Using a decongestant if you have nasal congestion.   Avoiding the use of depressants, including alcohol, sedatives, and narcotics.   Losing weight and modifying your diet if you are overweight. There also are devices and treatments to help open your airway:  Oral appliances. These are custom-made mouthpieces that shift your lower jaw forward and slightly open your bite. This opens your airway.  Devices that create positive airway pressure. This positive pressure "splints" your airway open to help you breathe better during sleep. The following devices create positive airway pressure:  Continuous positive  airway pressure (CPAP) device. The CPAP device creates a continuous level of air pressure with an air pump. The air is delivered to your airway through a mask while you sleep. This continuous pressure keeps your airway open.  Nasal expiratory positive airway pressure (EPAP) device. The EPAP device creates positive air pressure as you exhale. The device consists of single-use valves, which are inserted into each nostril and held in place by  adhesive. The valves create very little resistance when you inhale but create much more resistance when you exhale. That increased resistance creates the positive airway pressure. This positive pressure while you exhale keeps your airway open, making it easier to breath when you inhale again.  Bilevel positive airway pressure (BPAP) device. The BPAP device is used mainly in patients with central sleep apnea. This device is similar to the CPAP device because it also uses an air pump to deliver continuous air pressure through a mask. However, with the BPAP machine, the pressure is set at two different levels. The pressure when you exhale is lower than the pressure when you inhale.  Surgery. Typically, surgery is only done if you cannot comply with less invasive treatments or if the less invasive treatments do not improve your condition. Surgery involves removing excess tissue in your airway to create a wider passage way. Document Released: 12/18/2001 Document Revised: 04/24/2012 Document Reviewed: 05/06/2011 [facility] Patient Information 2014 [place].  Tinnitus Sounds you hear in your ears and coming from within the ear is called tinnitus. This can be a symptom of many ear disorders. It is often associated with hearing loss.  Tinnitus can be seen with:  Infections.  Ear blockages such as wax buildup.  Meniere's disease.  Ear damage.  Inherited.  Occupational causes. While irritating, it is not usually a threat to health. When the cause of the tinnitus is wax, infection in the middle ear, or foreign body it is easily treated. Hearing loss will usually be reversible.  TREATMENT  When treating the underlying cause does not get rid of tinnitus, it may be necessary to get rid of the unwanted sound by covering it up with more pleasant background noises. This may include music, the radio etc. There are tinnitus maskers which can be worn which produce background noise to cover up the  tinnitus. Avoid all medications which tend to make tinnitus worse such as alcohol, caffeine, aspirin, and nicotine. There are many soothing background tapes such as rain, ocean, thunderstorms, etc. These soothing sounds help with sleeping or resting. Keep all follow-up appointments and referrals. This is important to identify the cause of the problem. It also helps avoid complications, impaired hearing, disability, or chronic pain. Document Released: 12/28/2004 Document Revised: 03/22/2011 Document Reviewed: 08/16/2007 [facility] Patient Information 2014 [facility].

## 2013-02-19 NOTE — ED Provider Notes (Signed)
CSN: [id number]     Arrival date & time 02/19/13  7564 History   First MD Initiated Contact with Patient 02/19/13 [phone]     Chief Complaint  Patient presents with  . Ear Fullness     (Consider location/radiation/quality/duration/timing/severity/associated sxs/prior Treatment) Patient is a 68 y.o. female presenting with plugged ear sensation. The history is provided by the patient.  Ear Fullness   She complains of a sensation of noises in her ears and feeling her heart beat for several days. Symptoms occur mostly at nighttime. She also has nasal congestion for 2 weeks and a sensation of headache, and occasional dyspnea on exertion. She denies chest pain, dyspnea at rest, or dizziness. She has a CPAP machine, but has not been using it for 2 months. She has used Xanax in the past, but stopped taking them because of vivid dreams. She has been eating well. She saw her doctor last week for checkup and he advised her to use saline nasal drops for nasal discomfort. She denies fever or chills, cough, sputum production, or back pain. There are no other known modifying factors.  Past Medical History  Diagnosis Date  . Sleep apnea     use CPAP nightly  . Asthma   . Hypothyroid   . Hypertension   . GERD (gastroesophageal reflux disease)   . Anxiety   . Diabetes mellitus   . Hyperlipidemia   . Arthritis    Past Surgical History  Procedure Laterality Date  . Abdominal hysterectomy    . Cholecystectomy    . Eye surgery    . Colonoscopy with propofol N/A 08/29/2012    Procedure: COLONOSCOPY WITH PROPOFOL;  Surgeon: Garlan Fair, MD;  Location: [facility];  Service: Endoscopy;  Laterality: N/A;   Family History  Problem Relation Age of Onset  . Adopted: Yes   History  Substance Use Topics  . Smoking status: Never Smoker   . Smokeless tobacco: Never Used  . Alcohol Use: Yes     Comment: sometime   OB History   Grav Para Term Preterm Abortions TAB SAB Ect Mult Living                  Review of Systems  All other systems reviewed and are negative.      Allergies  Esomeprazole magnesium; Avelox; Demerol; Levofloxacin; and Lisinopril  Home Medications   Current Outpatient Rx  Name  Route  Sig  Dispense  Refill  . acetaminophen (TYLENOL) 500 MG tablet   Oral   Take 500-1,000 mg by mouth every 6 (six) hours as needed for mild pain.          Marland Kitchen albuterol (PROVENTIL HFA;VENTOLIN HFA) 108 (90 BASE) MCG/ACT inhaler   Inhalation   Inhale 2 puffs into the lungs every 6 (six) hours as needed for wheezing.         Marland Kitchen ALPRAZolam (XANAX) 0.25 MG tablet   Oral   Take 0.125-0.25 mg by mouth 3 (three) times daily as needed for anxiety. For anxiety         . amLODipine (NORVASC) 5 MG tablet   Oral   Take 5 mg by mouth at bedtime.         Marland Kitchen aspirin EC 81 MG tablet   Oral   Take 81 mg by mouth daily.         Marland Kitchen atorvastatin (LIPITOR) 40 MG tablet   Oral   Take 40 mg by mouth at bedtime.          Marland Kitchen  candesartan (ATACAND) 32 MG tablet   Oral   Take 32 mg by mouth at bedtime.         . cloNIDine (CATAPRES - DOSED IN MG/24 HR) 0.1 mg/24hr patch   Transdermal   Place 1 patch onto the skin every Sunday. On Sundays         . doxazosin (CARDURA) 8 MG tablet   Oral   Take 8 mg by mouth at bedtime.         . hydrochlorothiazide (HYDRODIURIL) 25 MG tablet   Oral   Take 25 mg by mouth 2 (two) times daily.          Marland Kitchen HYDROcodone-acetaminophen (NORCO) 7.5-325 MG per tablet   Oral   Take 0.5-1 tablets by mouth every 6 (six) hours as needed for moderate pain.         Marland Kitchen insulin glargine (LANTUS) 100 UNIT/ML injection   Subcutaneous   Inject 90 Units into the skin at bedtime.          . insulin lispro (HUMALOG) 100 UNIT/ML injection   Subcutaneous   Inject 4-12 Units into the skin 3 (three) times daily before meals. Sliding scale, she decides how much to give depending on how much she eats         . lansoprazole (PREVACID SOLUTAB) 30 MG  disintegrating tablet   Oral   Take 30 mg by mouth daily as needed (acid reflux).          Marland Kitchen levothyroxine (SYNTHROID, LEVOTHROID) 200 MCG tablet   Oral   Take 200 mcg by mouth daily before breakfast.          . metoprolol (TOPROL-XL) 50 MG 24 hr tablet   Oral   Take 50 mg by mouth at bedtime.          . potassium chloride (KLOR-CON) 20 MEQ packet   Oral   Take 40 mEq by mouth daily as needed (for potassium).          . Vitamin D, Ergocalciferol, (DRISDOL) 50000 UNITS CAPS   Oral   Take 50,000 Units by mouth every 7 (seven) days. monday          BP 123/53  Pulse 76  Temp(Src) 98.4 F (36.9 C) (Oral)  Resp 16  SpO2 88% Physical Exam  Nursing note and vitals reviewed. Constitutional: She is oriented to person, place, and time. She appears well-developed and well-nourished.  HENT:  Head: Normocephalic and atraumatic.  Right Ear: External ear normal.  Left Ear: External ear normal.  Nose: Nose normal.  Mouth/Throat: Oropharynx is clear and moist. No oropharyngeal exudate.  Mild tenderness to percussion of the left frontal and left maxillary sinuses. Left TM is partially obscured by wax. A portion of the left TM is visualized and appears normal. The right TM is normal.  Eyes: Conjunctivae and EOM are normal. Pupils are equal, round, and reactive to light.  Neck: Normal range of motion and phonation normal. Neck supple.  Cardiovascular: Normal rate, regular rhythm and intact distal pulses.   Pulmonary/Chest: Effort normal and breath sounds normal. She exhibits no tenderness.  Abdominal: Soft. She exhibits no distension. There is no tenderness. There is no guarding.  Musculoskeletal: Normal range of motion.  Neurological: She is alert and oriented to person, place, and time. She exhibits normal muscle tone.  Skin: Skin is warm and dry.  Psychiatric: She has a normal mood and affect. Her behavior is normal. Judgment and thought content normal.  ED Course   Procedures (including critical care time) Labs Review Labs Reviewed - No data to display Imaging Review No results found.  EKG Interpretation   None       MDM   Final diagnoses:  Sinusitis  Tinnitus  Sleep apnea     Symptoms consistent with acute sinusitis. She does not have chronic sinusitis. She has a fluctuating oxygen saturation, but no symptoms of dyspnea, chest pain, weakness, or dizziness. She has sleep apnea, but has not been using her CPAP machine for 2 months. She has access to followup care.  Nursing Notes Reviewed/ Care Coordinated Applicable Imaging Reviewed Interpretation of Laboratory Data incorporated into ED treatment  The patient appears reasonably screened and/or stabilized for discharge and I doubt any other medical condition or other [facility] requiring further screening, evaluation, or treatment in the ED at this time prior to discharge.  Plan: Home Medications- Biaxin; Home Treatments- rest; return here if the recommended treatment, does not improve the symptoms; Recommended follow up- PCP for check up in one or 2 weeks. Discussed options of evaluating for pulmonary problems, including pulmonary function testing. Chest x-ray, and CT scan     Richarda Blade, MD 02/19/13 1123

## 2013-02-19 NOTE — ED Notes (Signed)
Pt complains of ear pain for past 2 days with nausea and body aches. No V/D. Pt states ear pain worse with lying down

## 2013-03-12 ENCOUNTER — Encounter: Payer: Self-pay | Admitting: Podiatry

## 2013-03-12 ENCOUNTER — Ambulatory Visit (INDEPENDENT_AMBULATORY_CARE_PROVIDER_SITE_OTHER): Payer: Medicare Other | Admitting: Podiatry

## 2013-03-12 VITALS — BP 129/64 | HR 70 | Resp 18

## 2013-03-12 DIAGNOSIS — B351 Tinea unguium: Secondary | ICD-10-CM

## 2013-03-12 DIAGNOSIS — M79609 Pain in unspecified limb: Secondary | ICD-10-CM

## 2013-03-13 NOTE — Progress Notes (Signed)
Patient ID: [name], female   DOB: 1945-09-14, 68 y.o.   MRN: [id number]  Subjective: This 68 year old black diabetic female presents for ongoing debridement of painful mycotic toenails. She's been a patient of practice since 2005. Her last visit was 09/25/2012.   Objective: Elongated, hypertrophic, discolored, incurvated toenails with palpable tenderness in all 10 nail plates .  Assessment: Symptomatic onychomycoses x10   Plan: All 10 toenails are debrided back down to bleeding. Reappoint at three-month intervals.

## 2013-06-18 ENCOUNTER — Encounter: Payer: Self-pay | Admitting: Podiatry

## 2013-06-18 ENCOUNTER — Ambulatory Visit (INDEPENDENT_AMBULATORY_CARE_PROVIDER_SITE_OTHER): Payer: Medicare Other | Admitting: Podiatry

## 2013-06-18 VITALS — BP 150/85 | HR 69 | Resp 18

## 2013-06-18 DIAGNOSIS — B351 Tinea unguium: Secondary | ICD-10-CM

## 2013-06-18 DIAGNOSIS — M79609 Pain in unspecified limb: Secondary | ICD-10-CM

## 2013-06-18 NOTE — Progress Notes (Signed)
Patient ID: [name], female   DOB: [birth year], 68 y.o.   MRN: [id number]  Subjective: Orientated x3 black female presents with caregiver  Objective: Hypertrophic, elongated, incurvated, discolored toenails x10  Assessment: Symptomatic onychomycoses x10  Plan: Debrided toenails x10 without a bleeding  Reappoint in 3 months

## 2013-09-24 ENCOUNTER — Ambulatory Visit: Payer: Medicare Other | Admitting: Podiatry

## 2013-09-26 ENCOUNTER — Encounter: Payer: Self-pay | Admitting: Podiatry

## 2013-09-26 ENCOUNTER — Ambulatory Visit (INDEPENDENT_AMBULATORY_CARE_PROVIDER_SITE_OTHER): Payer: Medicare Other | Admitting: Podiatry

## 2013-09-26 VITALS — BP 127/69 | HR 70 | Resp 12

## 2013-09-26 DIAGNOSIS — B351 Tinea unguium: Secondary | ICD-10-CM

## 2013-09-26 DIAGNOSIS — M79676 Pain in unspecified toe(s): Secondary | ICD-10-CM

## 2013-09-26 DIAGNOSIS — M79609 Pain in unspecified limb: Secondary | ICD-10-CM

## 2013-09-26 NOTE — Progress Notes (Signed)
Patient ID: [name], female   DOB: 23-Feb-1945, 68 y.o.   MRN: [id number]  Subjective: This patient presents complaining of painful toenails  Objective: Elongated, hypertrophic, incurvated toenails 6-10  Assessment: Symptomatic onychomycoses 6-10   Plan: Debrided toenails x10 without a bleeding  Reappoint x3 months

## 2013-12-24 ENCOUNTER — Ambulatory Visit (INDEPENDENT_AMBULATORY_CARE_PROVIDER_SITE_OTHER): Payer: Medicare Other | Admitting: Podiatry

## 2013-12-24 VITALS — BP 140/70 | HR 61 | Resp 15

## 2013-12-24 DIAGNOSIS — B351 Tinea unguium: Secondary | ICD-10-CM

## 2013-12-24 DIAGNOSIS — M79676 Pain in unspecified toe(s): Secondary | ICD-10-CM

## 2013-12-24 NOTE — Patient Instructions (Signed)
Patient request Teds for ankle edema

## 2013-12-25 NOTE — Progress Notes (Signed)
Patient ID: [name], female   DOB: 1945/04/16, 68 y.o.   MRN: [id number]  Subjective: This patient presents again complaining of painful toenails and walking wearing shoes Also patient is complaining of low-grade swelling ankles and requests prescription for Teds  Objective: Mild. Pain in ankles bilaterally The toenails are elongated, hypertrophic, discolored 6-10  Assessment: Symptomatic onychomycoses 6-10 Mild pitting edema ankles bilaterally  Plan: Debridement toenails 10 without a bleeding Prescribed Teds, knee length [facility]  Reappoint at three-month intervals

## 2014-03-27 ENCOUNTER — Encounter: Payer: Self-pay | Admitting: Podiatry

## 2014-03-27 ENCOUNTER — Ambulatory Visit (INDEPENDENT_AMBULATORY_CARE_PROVIDER_SITE_OTHER): Payer: Medicare Other | Admitting: Podiatry

## 2014-03-27 DIAGNOSIS — M79676 Pain in unspecified toe(s): Secondary | ICD-10-CM | POA: Diagnosis not present

## 2014-03-27 DIAGNOSIS — B351 Tinea unguium: Secondary | ICD-10-CM

## 2014-03-27 NOTE — Progress Notes (Signed)
Patient ID: [name], female   DOB: [birth year], 69 y.o.   MRN: [id number]  Subjective: This patient presents complaining of painful toenails and requesting nail debridement  Objective: The toenails are hypertrophic, elongated, incurvated, discolored and tender to palpation 6-10  Assessment: Symptomatic onychomycoses 6-10  Plan: Debridement of toenails without any bleeding  Reappoint 3 months

## 2014-03-27 NOTE — Patient Instructions (Signed)
Diabetes and Foot Care Diabetes may cause you to have problems because of poor blood supply (circulation) to your feet and legs. This may cause the skin on your feet to become thinner, break easier, and heal more slowly. Your skin may become dry, and the skin may peel and crack. You may also have nerve damage in your legs and feet causing decreased feeling in them. You may not notice minor injuries to your feet that could lead to infections or more serious problems. Taking care of your feet is one of the most important things you can do for yourself.  HOME CARE INSTRUCTIONS  Wear shoes at all times, even in the house. Do not go barefoot. Bare feet are easily injured.  Check your feet daily for blisters, cuts, and redness. If you cannot see the bottom of your feet, use a mirror or ask someone for help.  Wash your feet with warm water (do not use hot water) and mild soap. Then pat your feet and the areas between your toes until they are completely dry. Do not soak your feet as this can dry your skin.  Apply a moisturizing lotion or petroleum jelly (that does not contain alcohol and is unscented) to the skin on your feet and to dry, brittle toenails. Do not apply lotion between your toes.  Trim your toenails straight across. Do not dig under them or around the cuticle. File the edges of your nails with an emery board or nail file.  Do not cut corns or calluses or try to remove them with medicine.  Wear clean socks or stockings every day. Make sure they are not too tight. Do not wear knee-high stockings since they may decrease blood flow to your legs.  Wear shoes that fit properly and have enough cushioning. To break in new shoes, wear them for just a few hours a day. This prevents you from injuring your feet. Always look in your shoes before you put them on to be sure there are no objects inside.  Do not cross your legs. This may decrease the blood flow to your feet.  If you find a minor scrape,  cut, or break in the skin on your feet, keep it and the skin around it clean and dry. These areas may be cleansed with mild soap and water. Do not cleanse the area with peroxide, alcohol, or iodine.  When you remove an adhesive bandage, be sure not to damage the skin around it.  If you have a wound, look at it several times a day to make sure it is healing.  Do not use heating pads or hot water bottles. They may burn your skin. If you have lost feeling in your feet or legs, you may not know it is happening until it is too late.  Make sure your health care provider performs a complete foot exam at least annually or more often if you have foot problems. Report any cuts, sores, or bruises to your health care provider immediately. SEEK MEDICAL CARE IF:   You have an injury that is not healing.  You have cuts or breaks in the skin.  You have an ingrown nail.  You notice redness on your legs or feet.  You feel burning or tingling in your legs or feet.  You have pain or cramps in your legs and feet.  Your legs or feet are numb.  Your feet always feel cold. SEEK IMMEDIATE MEDICAL CARE IF:   There is increasing redness,   swelling, or pain in or around a wound.  There is a red line that goes up your leg.  Pus is coming from a wound.  You develop a fever or as directed by your health care provider.  You notice a bad smell coming from an ulcer or wound. Document Released: 12/26/1999 Document Revised: 08/30/2012 Document Reviewed: 06/06/2012 Exit[employer] Patient Information 2015 [employer]. This information is not intended to replace advice given to you by your health care provider. Make sure you discuss any questions you have with your health care provider.  

## 2014-06-19 ENCOUNTER — Encounter: Payer: Self-pay | Admitting: Podiatry

## 2014-06-19 ENCOUNTER — Ambulatory Visit (INDEPENDENT_AMBULATORY_CARE_PROVIDER_SITE_OTHER): Payer: Medicare Other | Admitting: Podiatry

## 2014-06-19 DIAGNOSIS — M79676 Pain in unspecified toe(s): Secondary | ICD-10-CM

## 2014-06-19 DIAGNOSIS — B351 Tinea unguium: Secondary | ICD-10-CM

## 2014-06-19 NOTE — Patient Instructions (Signed)
Diabetes and Foot Care Diabetes may cause you to have problems because of poor blood supply (circulation) to your feet and legs. This may cause the skin on your feet to become thinner, break easier, and heal more slowly. Your skin may become dry, and the skin may peel and crack. You may also have nerve damage in your legs and feet causing decreased feeling in them. You may not notice minor injuries to your feet that could lead to infections or more serious problems. Taking care of your feet is one of the most important things you can do for yourself.  HOME CARE INSTRUCTIONS  Wear shoes at all times, even in the house. Do not go barefoot. Bare feet are easily injured.  Check your feet daily for blisters, cuts, and redness. If you cannot see the bottom of your feet, use a mirror or ask someone for help.  Wash your feet with warm water (do not use hot water) and mild soap. Then pat your feet and the areas between your toes until they are completely dry. Do not soak your feet as this can dry your skin.  Apply a moisturizing lotion or petroleum jelly (that does not contain alcohol and is unscented) to the skin on your feet and to dry, brittle toenails. Do not apply lotion between your toes.  Trim your toenails straight across. Do not dig under them or around the cuticle. File the edges of your nails with an emery board or nail file.  Do not cut corns or calluses or try to remove them with medicine.  Wear clean socks or stockings every day. Make sure they are not too tight. Do not wear knee-high stockings since they may decrease blood flow to your legs.  Wear shoes that fit properly and have enough cushioning. To break in new shoes, wear them for just a few hours a day. This prevents you from injuring your feet. Always look in your shoes before you put them on to be sure there are no objects inside.  Do not cross your legs. This may decrease the blood flow to your feet.  If you find a minor scrape,  cut, or break in the skin on your feet, keep it and the skin around it clean and dry. These areas may be cleansed with mild soap and water. Do not cleanse the area with peroxide, alcohol, or iodine.  When you remove an adhesive bandage, be sure not to damage the skin around it.  If you have a wound, look at it several times a day to make sure it is healing.  Do not use heating pads or hot water bottles. They may burn your skin. If you have lost feeling in your feet or legs, you may not know it is happening until it is too late.  Make sure your health care provider performs a complete foot exam at least annually or more often if you have foot problems. Report any cuts, sores, or bruises to your health care provider immediately. SEEK MEDICAL CARE IF:   You have an injury that is not healing.  You have cuts or breaks in the skin.  You have an ingrown nail.  You notice redness on your legs or feet.  You feel burning or tingling in your legs or feet.  You have pain or cramps in your legs and feet.  Your legs or feet are numb.  Your feet always feel cold. SEEK IMMEDIATE MEDICAL CARE IF:   There is increasing redness,   swelling, or pain in or around a wound.  There is a red line that goes up your leg.  Pus is coming from a wound.  You develop a fever or as directed by your health care provider.  You notice a bad smell coming from an ulcer or wound. Document Released: 12/26/1999 Document Revised: 08/30/2012 Document Reviewed: 06/06/2012 Exit[employer] Patient Information 2015 [employer]. This information is not intended to replace advice given to you by your health care provider. Make sure you discuss any questions you have with your health care provider.  

## 2014-06-19 NOTE — Progress Notes (Signed)
Patient ID: [name], female   DOB: 03/22/1945, 69 y.o.   MRN: [id number]  Subjective: This patient Presents again complaining of painful toenails and requests nail debridement  Objective: The toenails are elongated, hypertrophic, discolored, incurvated and tender to direct palpation 6-10  Assessment: Symptomatic onychomycoses 6-10 Diabetic  Plan: Debridement of toenails 10 without any bleeding  Reappoint 3 months

## 2014-07-17 ENCOUNTER — Encounter (HOSPITAL_COMMUNITY): Payer: Self-pay | Admitting: Emergency Medicine

## 2014-07-17 ENCOUNTER — Emergency Department (HOSPITAL_COMMUNITY)
Admission: EM | Admit: 2014-07-17 | Discharge: 2014-07-17 | Disposition: A | Discharge status: Home / Self Care | Payer: Medicare Other | Attending: Emergency Medicine | Admitting: Emergency Medicine

## 2014-07-17 DIAGNOSIS — G4730 Sleep apnea, unspecified: Secondary | ICD-10-CM | POA: Insufficient documentation

## 2014-07-17 DIAGNOSIS — J45909 Unspecified asthma, uncomplicated: Secondary | ICD-10-CM | POA: Diagnosis not present

## 2014-07-17 DIAGNOSIS — Z792 Long term (current) use of antibiotics: Secondary | ICD-10-CM | POA: Diagnosis not present

## 2014-07-17 DIAGNOSIS — M1990 Unspecified osteoarthritis, unspecified site: Secondary | ICD-10-CM | POA: Insufficient documentation

## 2014-07-17 DIAGNOSIS — Z794 Long term (current) use of insulin: Secondary | ICD-10-CM | POA: Insufficient documentation

## 2014-07-17 DIAGNOSIS — Z79899 Other long term (current) drug therapy: Secondary | ICD-10-CM | POA: Insufficient documentation

## 2014-07-17 DIAGNOSIS — Z9981 Dependence on supplemental oxygen: Secondary | ICD-10-CM | POA: Insufficient documentation

## 2014-07-17 DIAGNOSIS — N390 Urinary tract infection, site not specified: Secondary | ICD-10-CM | POA: Diagnosis not present

## 2014-07-17 DIAGNOSIS — E785 Hyperlipidemia, unspecified: Secondary | ICD-10-CM | POA: Insufficient documentation

## 2014-07-17 DIAGNOSIS — E039 Hypothyroidism, unspecified: Secondary | ICD-10-CM | POA: Insufficient documentation

## 2014-07-17 DIAGNOSIS — F419 Anxiety disorder, unspecified: Secondary | ICD-10-CM | POA: Diagnosis not present

## 2014-07-17 DIAGNOSIS — R300 Dysuria: Secondary | ICD-10-CM | POA: Diagnosis present

## 2014-07-17 DIAGNOSIS — E119 Type 2 diabetes mellitus without complications: Secondary | ICD-10-CM | POA: Insufficient documentation

## 2014-07-17 DIAGNOSIS — K219 Gastro-esophageal reflux disease without esophagitis: Secondary | ICD-10-CM | POA: Insufficient documentation

## 2014-07-17 DIAGNOSIS — Z7982 Long term (current) use of aspirin: Secondary | ICD-10-CM | POA: Insufficient documentation

## 2014-07-17 DIAGNOSIS — I10 Essential (primary) hypertension: Secondary | ICD-10-CM | POA: Insufficient documentation

## 2014-07-17 LAB — URINE MICROSCOPIC-ADD ON

## 2014-07-17 LAB — URINALYSIS, ROUTINE W REFLEX MICROSCOPIC
Bilirubin Urine: NEGATIVE
Glucose, UA: NEGATIVE mg/dL
Ketones, ur: NEGATIVE mg/dL
Nitrite: NEGATIVE
Protein, ur: NEGATIVE mg/dL
Specific Gravity, Urine: 1.019 (ref 1.005–1.030)
Urobilinogen, UA: 0.2 mg/dL (ref 0.0–1.0)
pH: 5.5 (ref 5.0–8.0)

## 2014-07-17 MED ORDER — TRAMADOL HCL 50 MG PO TABS: 50.0000 mg | ORAL_TABLET | Freq: Four times a day (QID) | ORAL | Status: DC | PRN

## 2014-07-17 MED ORDER — SULFAMETHOXAZOLE-TRIMETHOPRIM 800-160 MG PO TABS: 1.0000 | ORAL_TABLET | Freq: Two times a day (BID) | ORAL | Status: DC

## 2014-07-17 MED ORDER — CEPHALEXIN 500 MG PO CAPS: 500.0000 mg | ORAL_CAPSULE | Freq: Four times a day (QID) | ORAL | Status: DC

## 2014-07-17 MED ORDER — SULFAMETHOXAZOLE-TRIMETHOPRIM 800-160 MG PO TABS
1.0000 | ORAL_TABLET | Freq: Once | ORAL | Status: DC
  Filled 2014-07-17: qty 1

## 2014-07-17 MED ORDER — LORAZEPAM 1 MG PO TABS
1.0000 mg | ORAL_TABLET | Freq: Once | ORAL | Status: AC
  Administered 2014-07-17: 1 mg via ORAL
  Filled 2014-07-17: qty 1

## 2014-07-17 MED ORDER — HYDROMORPHONE HCL 1 MG/ML IJ SOLN
1.0000 mg | Freq: Once | INTRAMUSCULAR | Status: AC
  Administered 2014-07-17: 1 mg via INTRAMUSCULAR
  Filled 2014-07-17: qty 1

## 2014-07-17 MED ORDER — LORAZEPAM 1 MG PO TABS: 0.5000 mg | ORAL_TABLET | Freq: Three times a day (TID) | ORAL | Status: DC | PRN

## 2014-07-17 MED ORDER — PANTOPRAZOLE SODIUM 20 MG PO TBEC: 20.0000 mg | DELAYED_RELEASE_TABLET | Freq: Two times a day (BID) | ORAL | Status: DC

## 2014-07-17 NOTE — ED Provider Notes (Signed)
CSN: [id number]     Arrival date & time 07/17/14  3267 History   First MD Initiated Contact with Patient 07/17/14 [phone]     Chief Complaint  Patient presents with  . Hematuria  . Abdominal Pain  . Dysuria     (Consider location/radiation/quality/duration/timing/severity/associated sxs/prior Treatment) HPI   69 year old female with abdominal pain. Suprapubic left lower quadrant. Gradual onset 2 days ago. Associated with dysuria. Hematuria since yesterday. No nausea vomiting. No change in bowel movements. No vaginal bleeding or discharge. No fever or chills.   Past Medical History  Diagnosis Date  . Sleep apnea     use CPAP nightly  . Asthma   . Hypothyroid   . Hypertension   . GERD (gastroesophageal reflux disease)   . Anxiety   . Diabetes mellitus   . Hyperlipidemia   . Arthritis    Past Surgical History  Procedure Laterality Date  . Abdominal hysterectomy    . Cholecystectomy    . Eye surgery    . Colonoscopy with propofol N/A 08/29/2012    Procedure: COLONOSCOPY WITH PROPOFOL;  Surgeon: Garlan Fair, MD;  Location: [facility];  Service: Endoscopy;  Laterality: N/A;   Family History  Problem Relation Age of Onset  . Adopted: Yes   History  Substance Use Topics  . Smoking status: Never Smoker   . Smokeless tobacco: Never Used  . Alcohol Use: Yes     Comment: sometime   OB History    No data available     Review of Systems  All systems reviewed and negative, other than as noted in HPI.   Allergies  Esomeprazole magnesium; Augmentin; Avelox; Demerol; Levofloxacin; and Lisinopril  Home Medications   Prior to Admission medications   Medication Sig Start Date End Date Taking? Authorizing Provider  acetaminophen (TYLENOL) 500 MG tablet Take 500-1,000 mg by mouth every 6 (six) hours as needed for mild pain.    Yes Historical Provider, MD  albuterol (PROVENTIL HFA;VENTOLIN HFA) 108 (90 BASE) MCG/ACT inhaler Inhale 2 puffs into the lungs every 6 (six) hours  as needed for wheezing.   Yes Historical Provider, MD  ALPRAZolam (XANAX) 0.25 MG tablet Take 0.125-0.25 mg by mouth 3 (three) times daily as needed for anxiety. For anxiety   Yes Historical Provider, MD  insulin glargine (LANTUS) 100 UNIT/ML injection Inject 90 Units into the skin at bedtime.    Yes Historical Provider, MD  insulin lispro (HUMALOG) 100 UNIT/ML injection Inject 4-12 Units into the skin 3 (three) times daily before meals. Sliding scale, she decides how much to give depending on how much she eats   Yes Historical Provider, MD  levothyroxine (SYNTHROID, LEVOTHROID) 200 MCG tablet Take 200 mcg by mouth daily before breakfast.    Yes Historical Provider, MD  amLODipine (NORVASC) 5 MG tablet Take 5 mg by mouth at bedtime.    Historical Provider, MD  aspirin EC 81 MG tablet Take 81 mg by mouth daily.    Historical Provider, MD  atorvastatin (LIPITOR) 40 MG tablet Take 40 mg by mouth at bedtime.     Historical Provider, MD  candesartan (ATACAND) 32 MG tablet Take 32 mg by mouth at bedtime.    Historical Provider, MD  clarithromycin (BIAXIN) 500 MG tablet Take 1 tablet (500 mg total) by mouth 2 (two) times daily. Patient not taking: Reported on 07/17/2014 02/19/13   Daleen Bo, MD  cloNIDine (CATAPRES - DOSED IN MG/24 HR) 0.1 mg/24hr patch Place 1 patch onto the skin  every Sunday. On Sundays    Historical Provider, MD  doxazosin (CARDURA) 8 MG tablet Take 8 mg by mouth at bedtime.    Historical Provider, MD  hydrochlorothiazide (HYDRODIURIL) 25 MG tablet Take 25 mg by mouth 2 (two) times daily.     Historical Provider, MD  HYDROcodone-acetaminophen (NORCO) 7.5-325 MG per tablet Take 0.5-1 tablets by mouth every 6 (six) hours as needed for moderate pain.    Historical Provider, MD  lansoprazole (PREVACID SOLUTAB) 30 MG disintegrating tablet Take 30 mg by mouth daily as needed (acid reflux).     Historical Provider, MD  metoprolol (TOPROL-XL) 50 MG 24 hr tablet Take 50 mg by mouth at bedtime.      Historical Provider, MD  potassium chloride (KLOR-CON) 20 MEQ packet Take 40 mEq by mouth daily as needed (for potassium).     Historical Provider, MD  Vitamin D, Ergocalciferol, (DRISDOL) 50000 UNITS CAPS Take 50,000 Units by mouth every 7 (seven) days. monday    Historical Provider, MD   BP 145/81 mmHg  Pulse 61  Temp(Src) 97.9 F (36.6 C) (Oral)  Resp 18  SpO2 96% Physical Exam  Constitutional: She appears well-developed and well-nourished. No distress.  HENT:  Head: Normocephalic and atraumatic.  Eyes: Conjunctivae are normal. Right eye exhibits no discharge. Left eye exhibits no discharge.  Neck: Neck supple.  Cardiovascular: Normal rate, regular rhythm and normal heart sounds.  Exam reveals no gallop and no friction rub.   No murmur heard. Pulmonary/Chest: Effort normal and breath sounds normal. No respiratory distress.  Abdominal: Soft. She exhibits no distension. There is no tenderness.  Musculoskeletal: She exhibits no edema or tenderness.  No cva tenderness  Neurological: She is alert.  Skin: Skin is warm and dry.  Psychiatric: She has a normal mood and affect. Her behavior is normal. Thought content normal.  Nursing note and vitals reviewed.   ED Course  Procedures (including critical care time) Labs Review Labs Reviewed  URINALYSIS, ROUTINE W REFLEX MICROSCOPIC (NOT AT [facility]) - Abnormal; Notable for the following:    APPearance CLOUDY (*)    Hgb urine dipstick LARGE (*)    Leukocytes, UA SMALL (*)    All other components within normal limits  URINE MICROSCOPIC-ADD ON - Abnormal; Notable for the following:    Squamous Epithelial / LPF FEW (*)    Bacteria, UA FEW (*)    All other components within normal limits  URINE CULTURE    Imaging Review No results found.   EKG Interpretation None      MDM   Final diagnoses:  UTI (lower urinary tract infection)    58yF with abdominal pain, dysuria, hematuria. UA not overly convincing for UTI, but with symptoms  will tx as such at this time. Less likely kidney stone. Symptoms do not seems colicky.      Virgel Manifold, MD 07/18/14 [phone]

## 2014-07-17 NOTE — ED Notes (Signed)
Pt c/o left lower abdominal pain onset today after new onset episode of hematuria, and dysuria onset 2 days ago.

## 2014-07-17 NOTE — ED Notes (Signed)
She has just spoken at length with Dr. Wilson Singer, who is currently reviewing and alternate antibiotic as pt. Did not wish to receive Septra.  She remains comfortable and in no distress.  Her husband remains with her.

## 2014-07-17 NOTE — Discharge Instructions (Signed)

## 2014-07-19 LAB — URINE CULTURE

## 2014-09-06 ENCOUNTER — Emergency Department (HOSPITAL_COMMUNITY): Payer: Medicare Other

## 2014-09-06 ENCOUNTER — Encounter (HOSPITAL_COMMUNITY): Payer: Self-pay | Admitting: Emergency Medicine

## 2014-09-06 ENCOUNTER — Emergency Department (HOSPITAL_COMMUNITY)
Admission: EM | Admit: 2014-09-06 | Discharge: 2014-09-06 | Disposition: A | Discharge status: Home / Self Care | Payer: Medicare Other | Attending: Emergency Medicine | Admitting: Emergency Medicine

## 2014-09-06 DIAGNOSIS — Z7982 Long term (current) use of aspirin: Secondary | ICD-10-CM | POA: Diagnosis not present

## 2014-09-06 DIAGNOSIS — Z87442 Personal history of urinary calculi: Secondary | ICD-10-CM | POA: Diagnosis not present

## 2014-09-06 DIAGNOSIS — E039 Hypothyroidism, unspecified: Secondary | ICD-10-CM | POA: Diagnosis not present

## 2014-09-06 DIAGNOSIS — I10 Essential (primary) hypertension: Secondary | ICD-10-CM | POA: Insufficient documentation

## 2014-09-06 DIAGNOSIS — R109 Unspecified abdominal pain: Secondary | ICD-10-CM

## 2014-09-06 DIAGNOSIS — E785 Hyperlipidemia, unspecified: Secondary | ICD-10-CM | POA: Diagnosis not present

## 2014-09-06 DIAGNOSIS — M1990 Unspecified osteoarthritis, unspecified site: Secondary | ICD-10-CM | POA: Diagnosis not present

## 2014-09-06 DIAGNOSIS — R1032 Left lower quadrant pain: Secondary | ICD-10-CM | POA: Diagnosis not present

## 2014-09-06 DIAGNOSIS — G4730 Sleep apnea, unspecified: Secondary | ICD-10-CM | POA: Insufficient documentation

## 2014-09-06 DIAGNOSIS — Z9981 Dependence on supplemental oxygen: Secondary | ICD-10-CM | POA: Insufficient documentation

## 2014-09-06 DIAGNOSIS — F419 Anxiety disorder, unspecified: Secondary | ICD-10-CM | POA: Insufficient documentation

## 2014-09-06 DIAGNOSIS — J45909 Unspecified asthma, uncomplicated: Secondary | ICD-10-CM | POA: Diagnosis not present

## 2014-09-06 DIAGNOSIS — Z79899 Other long term (current) drug therapy: Secondary | ICD-10-CM | POA: Insufficient documentation

## 2014-09-06 DIAGNOSIS — E119 Type 2 diabetes mellitus without complications: Secondary | ICD-10-CM | POA: Diagnosis not present

## 2014-09-06 DIAGNOSIS — Z794 Long term (current) use of insulin: Secondary | ICD-10-CM | POA: Insufficient documentation

## 2014-09-06 DIAGNOSIS — K219 Gastro-esophageal reflux disease without esophagitis: Secondary | ICD-10-CM | POA: Insufficient documentation

## 2014-09-06 LAB — URINALYSIS, ROUTINE W REFLEX MICROSCOPIC
Glucose, UA: NEGATIVE mg/dL
Hgb urine dipstick: NEGATIVE
Ketones, ur: NEGATIVE mg/dL
Leukocytes, UA: NEGATIVE
Nitrite: NEGATIVE
Protein, ur: NEGATIVE mg/dL
Specific Gravity, Urine: 1.025 (ref 1.005–1.030)
Urobilinogen, UA: 1 mg/dL (ref 0.0–1.0)
pH: 5 (ref 5.0–8.0)

## 2014-09-06 LAB — CBC WITH DIFFERENTIAL/PLATELET
BASOS ABS: 0 10*3/uL (ref 0.0–0.1)
Basophils Relative: 0 % (ref 0–1)
Eosinophils Absolute: 0.1 10*3/uL (ref 0.0–0.7)
Eosinophils Relative: 2 % (ref 0–5)
HCT: 39.3 % (ref 36.0–46.0)
Hemoglobin: 12.3 g/dL (ref 12.0–15.0)
LYMPHS PCT: 35 % (ref 12–46)
Lymphs Abs: 1.9 10*3/uL (ref 0.7–4.0)
MCH: 28.8 pg (ref 26.0–34.0)
MCHC: 31.3 g/dL (ref 30.0–36.0)
MCV: 92 fL (ref 78.0–100.0)
Monocytes Absolute: 0.4 10*3/uL (ref 0.1–1.0)
Monocytes Relative: 8 % (ref 3–12)
Neutro Abs: 3 10*3/uL (ref 1.7–7.7)
Neutrophils Relative %: 55 % (ref 43–77)
PLATELETS: 176 10*3/uL (ref 150–400)
RBC: 4.27 MIL/uL (ref 3.87–5.11)
RDW: 15 % (ref 11.5–15.5)
WBC: 5.4 10*3/uL (ref 4.0–10.5)

## 2014-09-06 LAB — BASIC METABOLIC PANEL WITH GFR
Anion gap: 7 (ref 5–15)
BUN: 23 mg/dL — ABNORMAL HIGH (ref 6–20)
CO2: 33 mmol/L — ABNORMAL HIGH (ref 22–32)
Calcium: 9.5 mg/dL (ref 8.9–10.3)
Chloride: 100 mmol/L — ABNORMAL LOW (ref 101–111)
Creatinine, Ser: 1.37 mg/dL — ABNORMAL HIGH (ref 0.44–1.00)
GFR calc Af Amer: 45 mL/min — ABNORMAL LOW (ref >60)
GFR calc non Af Amer: 39 mL/min — ABNORMAL LOW (ref >60)
Glucose, Bld: 108 mg/dL — ABNORMAL HIGH (ref 65–99)
Potassium: 3.3 mmol/L — ABNORMAL LOW (ref 3.5–5.1)
Sodium: 140 mmol/L (ref 135–145)

## 2014-09-06 MED ORDER — HYDROMORPHONE HCL 1 MG/ML IJ SOLN
1.0000 mg | Freq: Once | INTRAMUSCULAR | Status: AC
  Administered 2014-09-06: 1 mg via INTRAVENOUS
  Filled 2014-09-06: qty 1

## 2014-09-06 MED ORDER — ALBUTEROL SULFATE HFA 108 (90 BASE) MCG/ACT IN AERS
2.0000 | INHALATION_SPRAY | Freq: Once | RESPIRATORY_TRACT | Status: AC
  Administered 2014-09-06: 2 via RESPIRATORY_TRACT
  Filled 2014-09-06: qty 6.7

## 2014-09-06 MED ORDER — ONDANSETRON HCL 4 MG/2ML IJ SOLN
4.0000 mg | Freq: Once | INTRAMUSCULAR | Status: AC
  Administered 2014-09-06: 4 mg via INTRAVENOUS
  Filled 2014-09-06: qty 2

## 2014-09-06 MED ORDER — MOXIFLOXACIN HCL 400 MG PO TABS: 400.0000 mg | ORAL_TABLET | Freq: Every day | ORAL | Status: DC

## 2014-09-06 MED ORDER — ONDANSETRON 8 MG PO TBDP: 8.0000 mg | ORAL_TABLET | Freq: Three times a day (TID) | ORAL | Status: DC | PRN

## 2014-09-06 NOTE — ED Provider Notes (Addendum)
CSN: [id number]     Arrival date & time 09/06/14  1009 History   First MD Initiated Contact with Patient 09/06/14 1024     Chief Complaint  Patient presents with  . Flank Pain      HPI Patient reports developing left flank and left lower quadrant abdominal pain over the past 2 days.  Patient believes that she may have a left-sided kidney stone.  She has had kidney stones before in the past.  She reports nausea without vomiting.  She denies diarrhea.  No chest pain or shortness of breath.  No vaginal complaints.  No diarrhea.  Her pain is mild to moderate in severity.  She had an old prescription for ciprofloxacin lying around the house for which she took 3 doses.  She had no relief of her symptoms and therefore came to the ER for evaluation.  She has Cipro she's had frequent urinary tract infections.   Past Medical History  Diagnosis Date  . Sleep apnea     use CPAP nightly  . Asthma   . Hypothyroid   . Hypertension   . GERD (gastroesophageal reflux disease)   . Anxiety   . Diabetes mellitus   . Hyperlipidemia   . Arthritis    Past Surgical History  Procedure Laterality Date  . Abdominal hysterectomy    . Cholecystectomy    . Eye surgery    . Colonoscopy with propofol N/A 08/29/2012    Procedure: COLONOSCOPY WITH PROPOFOL;  Surgeon: Garlan Fair, MD;  Location: [facility];  Service: Endoscopy;  Laterality: N/A;   Family History  Problem Relation Age of Onset  . Adopted: Yes   Social History  Substance Use Topics  . Smoking status: Never Smoker   . Smokeless tobacco: Never Used  . Alcohol Use: Yes     Comment: sometime   OB History    No data available     Review of Systems    Allergies  Esomeprazole magnesium; Augmentin; Avelox; Demerol; Keflex; Levofloxacin; and Lisinopril  Home Medications   Prior to Admission medications   Medication Sig Start Date End Date Taking? Authorizing Provider  acetaminophen (TYLENOL) 325 MG tablet Take 325 mg by mouth  daily as needed for moderate pain.   Yes Historical Provider, MD  albuterol (PROVENTIL HFA;VENTOLIN HFA) 108 (90 BASE) MCG/ACT inhaler Inhale 2 puffs into the lungs every 6 (six) hours as needed for wheezing.   Yes Historical Provider, MD  ALPRAZolam (XANAX) 0.25 MG tablet Take 0.125-0.25 mg by mouth 3 (three) times daily as needed for anxiety. For anxiety   Yes Historical Provider, MD  amLODipine (NORVASC) 5 MG tablet Take 5 mg by mouth at bedtime.   Yes Historical Provider, MD  aspirin EC 81 MG tablet Take 81 mg by mouth daily.   Yes Historical Provider, MD  atorvastatin (LIPITOR) 40 MG tablet Take 40 mg by mouth at bedtime.    Yes Historical Provider, MD  candesartan (ATACAND) 32 MG tablet Take 32 mg by mouth at bedtime.   Yes Historical Provider, MD  cloNIDine (CATAPRES - DOSED IN MG/24 HR) 0.1 mg/24hr patch Place 1 patch onto the skin every Sunday. On Sundays   Yes Historical Provider, MD  doxazosin (CARDURA) 8 MG tablet Take 4 mg by mouth at bedtime.    Yes Historical Provider, MD  hydrochlorothiazide (HYDRODIURIL) 25 MG tablet Take 25 mg by mouth daily.    Yes Historical Provider, MD  HYDROcodone-acetaminophen (NORCO) 7.5-325 MG per tablet Take 0.5-1 tablets  by mouth every 6 (six) hours as needed for moderate pain.   Yes Historical Provider, MD  insulin glargine (LANTUS) 100 UNIT/ML injection Inject 85 Units into the skin at bedtime.    Yes Historical Provider, MD  insulin lispro (HUMALOG) 100 UNIT/ML injection Inject 4-16 Units into the skin 3 (three) times daily before meals. Sliding scale, she decides how much to give depending on how much she eats   Yes Historical Provider, MD  levothyroxine (SYNTHROID, LEVOTHROID) 200 MCG tablet Take 200 mcg by mouth daily before breakfast.    Yes Historical Provider, MD  LORazepam (ATIVAN) 1 MG tablet Take 0.5 tablets (0.5 mg total) by mouth 3 (three) times daily as needed for anxiety. 07/17/14  Yes Virgel Manifold, MD  metoprolol (TOPROL-XL) 50 MG 24 hr  tablet Take 50 mg by mouth daily after supper.    Yes Historical Provider, MD  pantoprazole (PROTONIX) 20 MG tablet Take 1 tablet (20 mg total) by mouth 2 (two) times daily. 07/17/14  Yes Virgel Manifold, MD  potassium chloride SA (K-DUR,KLOR-CON) 20 MEQ tablet Take 20 mEq by mouth every other day.   Yes Historical Provider, MD  traMADol (ULTRAM) 50 MG tablet Take 1 tablet (50 mg total) by mouth every 6 (six) hours as needed. 07/17/14  Yes Virgel Manifold, MD  Vitamin D, Ergocalciferol, (DRISDOL) 50000 UNITS CAPS Take 50,000 Units by mouth every 7 (seven) days. Thursdays   Yes Historical Provider, MD  cephALEXin (KEFLEX) 500 MG capsule Take 1 capsule (500 mg total) by mouth 4 (four) times daily. Patient not taking: Reported on 09/06/2014 07/17/14   Virgel Manifold, MD  clarithromycin (BIAXIN) 500 MG tablet Take 1 tablet (500 mg total) by mouth 2 (two) times daily. Patient not taking: Reported on 07/17/2014 02/19/13   Daleen Bo, MD   BP 139/67 mmHg  Pulse 62  Temp(Src) 98.3 F (36.8 C) (Oral)  Resp 17  SpO2 96% Physical Exam  Constitutional: She is oriented to person, place, and time. She appears well-developed and well-nourished. No distress.  HENT:  Head: Normocephalic and atraumatic.  Eyes: EOM are normal.  Neck: Normal range of motion.  Cardiovascular: Normal rate, regular rhythm and normal heart sounds.   Pulmonary/Chest: Effort normal and breath sounds normal.  Abdominal: Soft. She exhibits no distension.  Mild LLQ tenderness  Musculoskeletal: Normal range of motion.  Neurological: She is alert and oriented to person, place, and time.  Skin: Skin is warm and dry.  Psychiatric: She has a normal mood and affect. Judgment normal.  Nursing note and vitals reviewed.   ED Course  Procedures (including critical care time) Labs Review Labs Reviewed  URINALYSIS, ROUTINE W REFLEX MICROSCOPIC (NOT AT [facility]) - Abnormal; Notable for the following:    Color, Urine AMBER (*)    APPearance CLOUDY (*)     Bilirubin Urine SMALL (*)    All other components within normal limits  BASIC METABOLIC PANEL - Abnormal; Notable for the following:    Potassium 3.3 (*)    Chloride 100 (*)    CO2 33 (*)    Glucose, Bld 108 (*)    BUN 23 (*)    Creatinine, Ser 1.37 (*)    GFR calc non Af Amer 39 (*)    GFR calc Af Amer 45 (*)    All other components within normal limits  CBC WITH DIFFERENTIAL/PLATELET    Imaging Review Dg Chest 2 View  09/06/2014   CLINICAL DATA:  Left flank pain for 2 days, cough  and shortness of breath with exertion.  EXAM: CHEST  2 VIEW  COMPARISON:  Chest x-ray dated 01/15/2011.  FINDINGS: Study is hypoinspiratory with low lung volumes and related crowding of the perihilar and bibasilar bronchovascular markings. Given the low lung volumes, lungs appear clear. No confluent opacities to suggest a developing pneumonia. No pleural effusion. No pneumothorax seen.  Cardiomediastinal silhouette is normal in size and configuration. Mild degenerative change noted within the thoracic spine. No acute osseous abnormality.  IMPRESSION: Hypoinspiratory exam with low lung volumes. The low lung volumes limits characterization but there is no evidence of acute cardiopulmonary abnormality. No evidence of pneumonia.   Electronically Signed   By: Franki Cabot M.D.   On: 09/06/2014 16:21   Ct Renal Stone Study  09/06/2014   CLINICAL DATA:  Left flank pain for 2 days. History of urinary tract stones. Hematuria.  EXAM: CT ABDOMEN AND PELVIS WITHOUT CONTRAST  TECHNIQUE: Multidetector CT imaging of the abdomen and pelvis was performed following the standard protocol without IV contrast.  COMPARISON:  CT abdomen and pelvis 03/03/2003.  FINDINGS: There is mild bibasilar atelectasis, more notable on the right. Heart size is mildly enlarged. No pleural or pericardial effusion.  A punctate nonobstructing stone is seen in the lower pole of the left kidney. There is no hydronephrosis on the left and no ureteral or  urinary bladder stones are identified. No right renal stones are present. A small exophytic cyst off the mid pole the right kidney has a thin rim of calcification. The kidneys are otherwise unremarkable.  The liver is diffusely low attenuating without focal lesion identified. The gallbladder has been removed. The spleen, adrenal glands and pancreas appear normal. Aortoiliac atherosclerosis without aneurysm is identified. The patient is status post hysterectomy. The stomach and small and large bowel are unremarkable. The appendix is not visualized and may have been removed. No evidence inflammatory process is seen. There is no lymphadenopathy or fluid.  No lytic or sclerotic bony lesion is not seen. There is degenerative disc disease and facet arthropathy most notable at L4-5 and L5-S1. Mild degenerative change is also present about the hips.  IMPRESSION: Punctate nonobstructing stone lower pole left kidney. Negative for hydronephrosis or ureteral stone. No acute abnormality.  Diffuse fatty infiltration of liver.  Aortoiliac atherosclerosis without aneurysm.  Status post cholecystectomy and hysterectomy.  Lower lumbar spondylosis and mild degenerative change about the hips.   Electronically Signed   By: Inge Rise M.D.   On: 09/06/2014 11:52   I have personally reviewed and evaluated these images and lab results as part of my medical decision-making.   EKG Interpretation   Date/Time:  Friday September 06 2014 12:20:18 EDT Ventricular Rate:  66 PR Interval:  202 QRS Duration: 124 QT Interval:  464 QTC Calculation: 486 R Axis:   -56 Text Interpretation:  Sinus rhythm Nonspecific IVCD with LAD Left  ventricular hypertrophy No significant change was found Confirmed by  Sederick Jacobsen  MD, Piedad Standiford ([id number]) on 09/06/2014 3:49:29 PM      MDM   Final diagnoses:  Flank pain  History of kidney stones    4:12 PM Patient is feeling better at this time.  CT scan without acute pathology but the patient now tells  me she has been on ciprofloxacin that she had at home for the past 3 days.  Given her left lower quadrant pain this could represent partially treated diverticulitis which is why we may not be seen inflammation on the CT scan.  Patient  will given 4 additional days of ciprofloxacin to complete a course. She is resistant to taking Flagyl and therefore instead I will treat the patient with Avelox.  I was told by nursing staff that when she ambulates her O2 sats dropped the mid 36s.  I personally walked the patient and she does have her oxygen levels dropped to 84%.  She does not feel out of breath.  Her O2 sats quickly returned.  She denies recent cough.  She denies shortness of breath when she is ambulating.  She does wear seat Pap at night.  I suspect this is a chronic issue and this will need to be worked up further as an outpatient.  I do not think this needs to be acutely worked up in the emergency department as she is really without symptoms.  I don't believe is related to her presentation today.  I do not think she has pulmonary embolism as a cause of her left lower quadrant abdominal pain.patient understands return the ER for new or worsening symptoms. Given albuterol in the ER   Jola Schmidt, MD 09/06/14 [place], MD 09/06/14 [place], MD 09/24/14 [phone]

## 2014-09-06 NOTE — ED Notes (Signed)
Pt c/o left flank pain x 2 days.  Pt denies any urinary problems. Pt states that she thinks it is a kidney stone bc she changed her milk and the new milk has increased amounts of calcium which what caused her kidney stones in past.

## 2014-09-06 NOTE — Discharge Instructions (Signed)

## 2014-09-06 NOTE — ED Notes (Signed)
Patient ambulated 32 feet with no assistance. Patients O2 saturation at the beginning of the ambulation was at 90% on room air. At the end of the ambulation patients O2 saturation was at 83%.

## 2014-09-06 NOTE — ED Notes (Signed)
Patient transported to X-ray 

## 2014-09-06 NOTE — ED Notes (Signed)
Pt request ice. Pt info me after she eat her ice might be able to give urine sample

## 2014-09-24 ENCOUNTER — Ambulatory Visit: Admitting: Podiatry

## 2014-10-08 ENCOUNTER — Ambulatory Visit: Admitting: Podiatry

## 2014-10-16 ENCOUNTER — Encounter: Payer: Self-pay | Admitting: Podiatry

## 2014-10-16 ENCOUNTER — Ambulatory Visit (INDEPENDENT_AMBULATORY_CARE_PROVIDER_SITE_OTHER): Payer: Medicare Other | Admitting: Podiatry

## 2014-10-16 DIAGNOSIS — B351 Tinea unguium: Secondary | ICD-10-CM

## 2014-10-16 DIAGNOSIS — M79676 Pain in unspecified toe(s): Secondary | ICD-10-CM | POA: Diagnosis not present

## 2014-10-16 NOTE — Patient Instructions (Signed)
Diabetes and Foot Care Diabetes may cause you to have problems because of poor blood supply (circulation) to your feet and legs. This may cause the skin on your feet to become thinner, break easier, and heal more slowly. Your skin may become dry, and the skin may peel and crack. You may also have nerve damage in your legs and feet causing decreased feeling in them. You may not notice minor injuries to your feet that could lead to infections or more serious problems. Taking care of your feet is one of the most important things you can do for yourself.  HOME CARE INSTRUCTIONS  Wear shoes at all times, even in the house. Do not go barefoot. Bare feet are easily injured.  Check your feet daily for blisters, cuts, and redness. If you cannot see the bottom of your feet, use a mirror or ask someone for help.  Wash your feet with warm water (do not use hot water) and mild soap. Then pat your feet and the areas between your toes until they are completely dry. Do not soak your feet as this can dry your skin.  Apply a moisturizing lotion or petroleum jelly (that does not contain alcohol and is unscented) to the skin on your feet and to dry, brittle toenails. Do not apply lotion between your toes.  Trim your toenails straight across. Do not dig under them or around the cuticle. File the edges of your nails with an emery board or nail file.  Do not cut corns or calluses or try to remove them with medicine.  Wear clean socks or stockings every day. Make sure they are not too tight. Do not wear knee-high stockings since they may decrease blood flow to your legs.  Wear shoes that fit properly and have enough cushioning. To break in new shoes, wear them for just a few hours a day. This prevents you from injuring your feet. Always look in your shoes before you put them on to be sure there are no objects inside.  Do not cross your legs. This may decrease the blood flow to your feet.  If you find a minor scrape,  cut, or break in the skin on your feet, keep it and the skin around it clean and dry. These areas may be cleansed with mild soap and water. Do not cleanse the area with peroxide, alcohol, or iodine.  When you remove an adhesive bandage, be sure not to damage the skin around it.  If you have a wound, look at it several times a day to make sure it is healing.  Do not use heating pads or hot water bottles. They may burn your skin. If you have lost feeling in your feet or legs, you may not know it is happening until it is too late.  Make sure your health care provider performs a complete foot exam at least annually or more often if you have foot problems. Report any cuts, sores, or bruises to your health care provider immediately. SEEK MEDICAL CARE IF:   You have an injury that is not healing.  You have cuts or breaks in the skin.  You have an ingrown nail.  You notice redness on your legs or feet.  You feel burning or tingling in your legs or feet.  You have pain or cramps in your legs and feet.  Your legs or feet are numb.  Your feet always feel cold. SEEK IMMEDIATE MEDICAL CARE IF:   There is increasing redness,   swelling, or pain in or around a wound.  There is a red line that goes up your leg.  Pus is coming from a wound.  You develop a fever or as directed by your health care provider.  You notice a bad smell coming from an ulcer or wound.   This information is not intended to replace advice given to you by your health care provider. Make sure you discuss any questions you have with your health care provider.   Document Released: 12/26/1999 Document Revised: 08/30/2012 Document Reviewed: 06/06/2012 Elsevier Interactive Patient Education 2016 [employer]  

## 2014-10-17 NOTE — Progress Notes (Signed)
Patient ID: [name], female   DOB: April 19, 1945, 69 y.o.   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

## 2015-01-28 ENCOUNTER — Encounter: Payer: Self-pay | Admitting: Podiatry

## 2015-01-28 ENCOUNTER — Ambulatory Visit (INDEPENDENT_AMBULATORY_CARE_PROVIDER_SITE_OTHER): Payer: Medicare Other | Admitting: Podiatry

## 2015-01-28 DIAGNOSIS — B351 Tinea unguium: Secondary | ICD-10-CM | POA: Diagnosis not present

## 2015-01-28 DIAGNOSIS — M79676 Pain in unspecified toe(s): Secondary | ICD-10-CM

## 2015-01-28 NOTE — Progress Notes (Signed)
Patient ID: [name], female   DOB: November 16, 1945, 70 y.o.   MRN: [id number]  Subjective: This patient presents for a scheduled visit complaining of elongated and thickened toenails which are uncomfortable walking wearing shoes and request toenail debridement  Objective: No open skin lesions bilaterally The toenails are elongated, hypertrophic, discolored, deformed, and tender direct palpation 6-10  Assessment: Diabetic on insulin Symptomatic onychomycoses 6-10  Plan: Debridement of toenails 6-10 mechanically and legs without any bleeding  Reappoint 3 months

## 2015-01-28 NOTE — Patient Instructions (Signed)
Diabetes and Foot Care Diabetes may cause you to have problems because of poor blood supply (circulation) to your feet and legs. This may cause the skin on your feet to become thinner, break easier, and heal more slowly. Your skin may become dry, and the skin may peel and crack. You may also have nerve damage in your legs and feet causing decreased feeling in them. You may not notice minor injuries to your feet that could lead to infections or more serious problems. Taking care of your feet is one of the most important things you can do for yourself.  HOME CARE INSTRUCTIONS  Wear shoes at all times, even in the house. Do not go barefoot. Bare feet are easily injured.  Check your feet daily for blisters, cuts, and redness. If you cannot see the bottom of your feet, use a mirror or ask someone for help.  Wash your feet with warm water (do not use hot water) and mild soap. Then pat your feet and the areas between your toes until they are completely dry. Do not soak your feet as this can dry your skin.  Apply a moisturizing lotion or petroleum jelly (that does not contain alcohol and is unscented) to the skin on your feet and to dry, brittle toenails. Do not apply lotion between your toes.  Trim your toenails straight across. Do not dig under them or around the cuticle. File the edges of your nails with an emery board or nail file.  Do not cut corns or calluses or try to remove them with medicine.  Wear clean socks or stockings every day. Make sure they are not too tight. Do not wear knee-high stockings since they may decrease blood flow to your legs.  Wear shoes that fit properly and have enough cushioning. To break in new shoes, wear them for just a few hours a day. This prevents you from injuring your feet. Always look in your shoes before you put them on to be sure there are no objects inside.  Do not cross your legs. This may decrease the blood flow to your feet.  If you find a minor scrape,  cut, or break in the skin on your feet, keep it and the skin around it clean and dry. These areas may be cleansed with mild soap and water. Do not cleanse the area with peroxide, alcohol, or iodine.  When you remove an adhesive bandage, be sure not to damage the skin around it.  If you have a wound, look at it several times a day to make sure it is healing.  Do not use heating pads or hot water bottles. They may burn your skin. If you have lost feeling in your feet or legs, you may not know it is happening until it is too late.  Make sure your health care provider performs a complete foot exam at least annually or more often if you have foot problems. Report any cuts, sores, or bruises to your health care provider immediately. SEEK MEDICAL CARE IF:   You have an injury that is not healing.  You have cuts or breaks in the skin.  You have an ingrown nail.  You notice redness on your legs or feet.  You feel burning or tingling in your legs or feet.  You have pain or cramps in your legs and feet.  Your legs or feet are numb.  Your feet always feel cold. SEEK IMMEDIATE MEDICAL CARE IF:   There is increasing redness,   swelling, or pain in or around a wound.  There is a red line that goes up your leg.  Pus is coming from a wound.  You develop a fever or as directed by your health care provider.  You notice a bad smell coming from an ulcer or wound.   This information is not intended to replace advice given to you by your health care provider. Make sure you discuss any questions you have with your health care provider.   Document Released: 12/26/1999 Document Revised: 08/30/2012 Document Reviewed: 06/06/2012 Elsevier Interactive Patient Education 2016 [employer]  

## 2015-05-06 ENCOUNTER — Encounter: Payer: Self-pay | Admitting: Podiatry

## 2015-05-06 ENCOUNTER — Ambulatory Visit (INDEPENDENT_AMBULATORY_CARE_PROVIDER_SITE_OTHER): Payer: Medicare Other | Admitting: Podiatry

## 2015-05-06 DIAGNOSIS — M79676 Pain in unspecified toe(s): Secondary | ICD-10-CM | POA: Diagnosis not present

## 2015-05-06 DIAGNOSIS — B351 Tinea unguium: Secondary | ICD-10-CM

## 2015-05-06 NOTE — Patient Instructions (Signed)
Diabetes and Foot Care Diabetes may cause you to have problems because of poor blood supply (circulation) to your feet and legs. This may cause the skin on your feet to become thinner, break easier, and heal more slowly. Your skin may become dry, and the skin may peel and crack. You may also have nerve damage in your legs and feet causing decreased feeling in them. You may not notice minor injuries to your feet that could lead to infections or more serious problems. Taking care of your feet is one of the most important things you can do for yourself.  HOME CARE INSTRUCTIONS  Wear shoes at all times, even in the house. Do not go barefoot. Bare feet are easily injured.  Check your feet daily for blisters, cuts, and redness. If you cannot see the bottom of your feet, use a mirror or ask someone for help.  Wash your feet with warm water (do not use hot water) and mild soap. Then pat your feet and the areas between your toes until they are completely dry. Do not soak your feet as this can dry your skin.  Apply a moisturizing lotion or petroleum jelly (that does not contain alcohol and is unscented) to the skin on your feet and to dry, brittle toenails. Do not apply lotion between your toes.  Trim your toenails straight across. Do not dig under them or around the cuticle. File the edges of your nails with an emery board or nail file.  Do not cut corns or calluses or try to remove them with medicine.  Wear clean socks or stockings every day. Make sure they are not too tight. Do not wear knee-high stockings since they may decrease blood flow to your legs.  Wear shoes that fit properly and have enough cushioning. To break in new shoes, wear them for just a few hours a day. This prevents you from injuring your feet. Always look in your shoes before you put them on to be sure there are no objects inside.  Do not cross your legs. This may decrease the blood flow to your feet.  If you find a minor scrape,  cut, or break in the skin on your feet, keep it and the skin around it clean and dry. These areas may be cleansed with mild soap and water. Do not cleanse the area with peroxide, alcohol, or iodine.  When you remove an adhesive bandage, be sure not to damage the skin around it.  If you have a wound, look at it several times a day to make sure it is healing.  Do not use heating pads or hot water bottles. They may burn your skin. If you have lost feeling in your feet or legs, you may not know it is happening until it is too late.  Make sure your health care provider performs a complete foot exam at least annually or more often if you have foot problems. Report any cuts, sores, or bruises to your health care provider immediately. SEEK MEDICAL CARE IF:   You have an injury that is not healing.  You have cuts or breaks in the skin.  You have an ingrown nail.  You notice redness on your legs or feet.  You feel burning or tingling in your legs or feet.  You have pain or cramps in your legs and feet.  Your legs or feet are numb.  Your feet always feel cold. SEEK IMMEDIATE MEDICAL CARE IF:   There is increasing redness,   swelling, or pain in or around a wound.  There is a red line that goes up your leg.  Pus is coming from a wound.  You develop a fever or as directed by your health care provider.  You notice a bad smell coming from an ulcer or wound.   This information is not intended to replace advice given to you by your health care provider. Make sure you discuss any questions you have with your health care provider.   Document Released: 12/26/1999 Document Revised: 08/30/2012 Document Reviewed: 06/06/2012 Elsevier Interactive Patient Education 2016 [employer]  

## 2015-05-06 NOTE — Progress Notes (Signed)
Patient ID: [name], female   DOB: Nov 13, 1945, 70 y.o.   MRN: [id number]  Subjective: This patient presents for a scheduled visit complaining of elongated and thickened toenails which are uncomfortable walking wearing shoes and request toenail debridement  Objective: No open skin lesions bilaterally The toenails are elongated, hypertrophic, discolored, deformed, and tender direct palpation 6-10  Assessment: Diabetic on insulin Symptomatic onychomycoses 6-10  Plan: Debridement of toenails 6-10 mechanically and lelectrically without any bleeding  Reappoint 3 months

## 2015-05-08 ENCOUNTER — Other Ambulatory Visit: Payer: Self-pay | Admitting: Internal Medicine

## 2015-05-08 DIAGNOSIS — K219 Gastro-esophageal reflux disease without esophagitis: Secondary | ICD-10-CM

## 2015-05-13 ENCOUNTER — Other Ambulatory Visit: Payer: Self-pay | Admitting: Internal Medicine

## 2015-05-13 ENCOUNTER — Ambulatory Visit
Admission: RE | Admit: 2015-05-13 | Discharge: 2015-05-13 | Disposition: A | Discharge status: Home / Self Care | Payer: Medicare Other | Source: Ambulatory Visit | Attending: Internal Medicine | Admitting: Internal Medicine

## 2015-05-13 DIAGNOSIS — K219 Gastro-esophageal reflux disease without esophagitis: Secondary | ICD-10-CM

## 2015-08-05 ENCOUNTER — Ambulatory Visit (INDEPENDENT_AMBULATORY_CARE_PROVIDER_SITE_OTHER): Payer: Medicare Other | Admitting: Podiatry

## 2015-08-05 ENCOUNTER — Encounter: Payer: Self-pay | Admitting: Podiatry

## 2015-08-05 DIAGNOSIS — M79676 Pain in unspecified toe(s): Secondary | ICD-10-CM | POA: Diagnosis not present

## 2015-08-05 DIAGNOSIS — B351 Tinea unguium: Secondary | ICD-10-CM

## 2015-08-05 NOTE — Patient Instructions (Signed)
Diabetes and Foot Care Diabetes may cause you to have problems because of poor blood supply (circulation) to your feet and legs. This may cause the skin on your feet to become thinner, break easier, and heal more slowly. Your skin may become dry, and the skin may peel and crack. You may also have nerve damage in your legs and feet causing decreased feeling in them. You may not notice minor injuries to your feet that could lead to infections or more serious problems. Taking care of your feet is one of the most important things you can do for yourself.  HOME CARE INSTRUCTIONS  Wear shoes at all times, even in the house. Do not go barefoot. Bare feet are easily injured.  Check your feet daily for blisters, cuts, and redness. If you cannot see the bottom of your feet, use a mirror or ask someone for help.  Wash your feet with warm water (do not use hot water) and mild soap. Then pat your feet and the areas between your toes until they are completely dry. Do not soak your feet as this can dry your skin.  Apply a moisturizing lotion or petroleum jelly (that does not contain alcohol and is unscented) to the skin on your feet and to dry, brittle toenails. Do not apply lotion between your toes.  Trim your toenails straight across. Do not dig under them or around the cuticle. File the edges of your nails with an emery board or nail file.  Do not cut corns or calluses or try to remove them with medicine.  Wear clean socks or stockings every day. Make sure they are not too tight. Do not wear knee-high stockings since they may decrease blood flow to your legs.  Wear shoes that fit properly and have enough cushioning. To break in new shoes, wear them for just a few hours a day. This prevents you from injuring your feet. Always look in your shoes before you put them on to be sure there are no objects inside.  Do not cross your legs. This may decrease the blood flow to your feet.  If you find a minor scrape,  cut, or break in the skin on your feet, keep it and the skin around it clean and dry. These areas may be cleansed with mild soap and water. Do not cleanse the area with peroxide, alcohol, or iodine.  When you remove an adhesive bandage, be sure not to damage the skin around it.  If you have a wound, look at it several times a day to make sure it is healing.  Do not use heating pads or hot water bottles. They may burn your skin. If you have lost feeling in your feet or legs, you may not know it is happening until it is too late.  Make sure your health care provider performs a complete foot exam at least annually or more often if you have foot problems. Report any cuts, sores, or bruises to your health care provider immediately. SEEK MEDICAL CARE IF:   You have an injury that is not healing.  You have cuts or breaks in the skin.  You have an ingrown nail.  You notice redness on your legs or feet.  You feel burning or tingling in your legs or feet.  You have pain or cramps in your legs and feet.  Your legs or feet are numb.  Your feet always feel cold. SEEK IMMEDIATE MEDICAL CARE IF:   There is increasing redness,   swelling, or pain in or around a wound.  There is a red line that goes up your leg.  Pus is coming from a wound.  You develop a fever or as directed by your health care provider.  You notice a bad smell coming from an ulcer or wound.   This information is not intended to replace advice given to you by your health care provider. Make sure you discuss any questions you have with your health care provider.   Document Released: 12/26/1999 Document Revised: 08/30/2012 Document Reviewed: 06/06/2012 Elsevier Interactive Patient Education 2016 [employer]  

## 2015-08-05 NOTE — Progress Notes (Signed)
Patient ID: [name], female   DOB: 1945-07-23, 70 y.o.   MRN: [id number]   Subjective: This patient presents for a scheduled visit complaining of elongated and thickened toenails which are uncomfortable walking wearing shoes and request toenail debridement  Objective: No open skin lesions bilaterally The toenails are elongated, hypertrophic, discolored, deformed, and tender direct palpation 6-10  Assessment: Diabetic on insulin Symptomatic onychomycoses 6-10  Plan: Debridement of toenails 6-10 mechanically and lelectrically without any bleeding  Reappoint 3 months

## 2015-09-17 ENCOUNTER — Encounter (HOSPITAL_COMMUNITY): Payer: Self-pay | Admitting: *Deleted

## 2015-09-17 ENCOUNTER — Emergency Department (HOSPITAL_COMMUNITY): Payer: Medicare Other

## 2015-09-17 ENCOUNTER — Emergency Department (HOSPITAL_COMMUNITY)
Admission: EM | Admit: 2015-09-17 | Discharge: 2015-09-17 | Disposition: A | Discharge status: Home / Self Care | Payer: Medicare Other | Attending: Emergency Medicine | Admitting: Emergency Medicine

## 2015-09-17 DIAGNOSIS — T148 Other injury of unspecified body region: Secondary | ICD-10-CM | POA: Diagnosis not present

## 2015-09-17 DIAGNOSIS — J45909 Unspecified asthma, uncomplicated: Secondary | ICD-10-CM | POA: Insufficient documentation

## 2015-09-17 DIAGNOSIS — E119 Type 2 diabetes mellitus without complications: Secondary | ICD-10-CM | POA: Diagnosis not present

## 2015-09-17 DIAGNOSIS — Y999 Unspecified external cause status: Secondary | ICD-10-CM | POA: Diagnosis not present

## 2015-09-17 DIAGNOSIS — Y92410 Unspecified street and highway as the place of occurrence of the external cause: Secondary | ICD-10-CM | POA: Insufficient documentation

## 2015-09-17 DIAGNOSIS — Y939 Activity, unspecified: Secondary | ICD-10-CM | POA: Insufficient documentation

## 2015-09-17 DIAGNOSIS — M25511 Pain in right shoulder: Secondary | ICD-10-CM | POA: Insufficient documentation

## 2015-09-17 DIAGNOSIS — I10 Essential (primary) hypertension: Secondary | ICD-10-CM | POA: Insufficient documentation

## 2015-09-17 DIAGNOSIS — Z7951 Long term (current) use of inhaled steroids: Secondary | ICD-10-CM | POA: Diagnosis not present

## 2015-09-17 DIAGNOSIS — Z79899 Other long term (current) drug therapy: Secondary | ICD-10-CM | POA: Diagnosis not present

## 2015-09-17 DIAGNOSIS — E039 Hypothyroidism, unspecified: Secondary | ICD-10-CM | POA: Diagnosis not present

## 2015-09-17 DIAGNOSIS — Z7982 Long term (current) use of aspirin: Secondary | ICD-10-CM | POA: Diagnosis not present

## 2015-09-17 DIAGNOSIS — Z794 Long term (current) use of insulin: Secondary | ICD-10-CM | POA: Diagnosis not present

## 2015-09-17 DIAGNOSIS — Z041 Encounter for examination and observation following transport accident: Secondary | ICD-10-CM | POA: Diagnosis present

## 2015-09-17 DIAGNOSIS — T148XXA Other injury of unspecified body region, initial encounter: Secondary | ICD-10-CM

## 2015-09-17 MED ORDER — OXYCODONE-ACETAMINOPHEN 5-325 MG PO TABS
1.0000 | ORAL_TABLET | ORAL | Status: DC | PRN
  Administered 2015-09-17: 1 via ORAL
  Filled 2015-09-17: qty 1

## 2015-09-17 MED ORDER — CYCLOBENZAPRINE HCL 5 MG PO TABS: 5.0000 mg | ORAL_TABLET | Freq: Two times a day (BID) | ORAL | 0 refills | Status: DC | PRN

## 2015-09-17 MED ORDER — OXYCODONE-ACETAMINOPHEN 5-325 MG PO TABS: 1.0000 | ORAL_TABLET | Freq: Four times a day (QID) | ORAL | 0 refills | Status: DC | PRN

## 2015-09-17 MED ORDER — TRAMADOL HCL 50 MG PO TABS: 50.0000 mg | ORAL_TABLET | Freq: Four times a day (QID) | ORAL | 0 refills | Status: DC | PRN

## 2015-09-17 NOTE — ED Notes (Signed)
PT DISCHARGED. INSTRUCTIONS AND PRESCRIPTIONS GIVEN. AAOX4. PT IN NO APPARENT DISTRESS. THE OPPORTUNITY TO ASK QUESTIONS WAS PROVIDED. 
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
## 2015-09-17 NOTE — Discharge Instructions (Signed)

## 2015-09-23 ENCOUNTER — Encounter (HOSPITAL_COMMUNITY): Payer: Self-pay | Admitting: Emergency Medicine

## 2015-09-23 ENCOUNTER — Observation Stay (HOSPITAL_COMMUNITY)
Admission: EM | Admit: 2015-09-23 | Discharge: 2015-09-25 | Disposition: A | Discharge status: Home / Self Care | Payer: Medicare Other | Attending: Internal Medicine | Admitting: Internal Medicine

## 2015-09-23 ENCOUNTER — Emergency Department (HOSPITAL_COMMUNITY): Payer: Medicare Other

## 2015-09-23 DIAGNOSIS — R7989 Other specified abnormal findings of blood chemistry: Secondary | ICD-10-CM

## 2015-09-23 DIAGNOSIS — Z9989 Dependence on other enabling machines and devices: Secondary | ICD-10-CM | POA: Diagnosis not present

## 2015-09-23 DIAGNOSIS — R748 Abnormal levels of other serum enzymes: Secondary | ICD-10-CM | POA: Diagnosis not present

## 2015-09-23 DIAGNOSIS — K219 Gastro-esophageal reflux disease without esophagitis: Secondary | ICD-10-CM | POA: Insufficient documentation

## 2015-09-23 DIAGNOSIS — N183 Chronic kidney disease, stage 3 unspecified: Secondary | ICD-10-CM

## 2015-09-23 DIAGNOSIS — E1165 Type 2 diabetes mellitus with hyperglycemia: Secondary | ICD-10-CM | POA: Insufficient documentation

## 2015-09-23 DIAGNOSIS — E119 Type 2 diabetes mellitus without complications: Secondary | ICD-10-CM

## 2015-09-23 DIAGNOSIS — Z7982 Long term (current) use of aspirin: Secondary | ICD-10-CM | POA: Diagnosis not present

## 2015-09-23 DIAGNOSIS — F419 Anxiety disorder, unspecified: Secondary | ICD-10-CM | POA: Insufficient documentation

## 2015-09-23 DIAGNOSIS — I129 Hypertensive chronic kidney disease with stage 1 through stage 4 chronic kidney disease, or unspecified chronic kidney disease: Secondary | ICD-10-CM | POA: Diagnosis not present

## 2015-09-23 DIAGNOSIS — E785 Hyperlipidemia, unspecified: Secondary | ICD-10-CM | POA: Insufficient documentation

## 2015-09-23 DIAGNOSIS — G4733 Obstructive sleep apnea (adult) (pediatric): Secondary | ICD-10-CM | POA: Insufficient documentation

## 2015-09-23 DIAGNOSIS — M1990 Unspecified osteoarthritis, unspecified site: Secondary | ICD-10-CM | POA: Insufficient documentation

## 2015-09-23 DIAGNOSIS — E1121 Type 2 diabetes mellitus with diabetic nephropathy: Secondary | ICD-10-CM | POA: Insufficient documentation

## 2015-09-23 DIAGNOSIS — Z6841 Body Mass Index (BMI) 40.0 and over, adult: Secondary | ICD-10-CM | POA: Insufficient documentation

## 2015-09-23 DIAGNOSIS — E1122 Type 2 diabetes mellitus with diabetic chronic kidney disease: Secondary | ICD-10-CM | POA: Diagnosis not present

## 2015-09-23 DIAGNOSIS — R93421 Abnormal radiologic findings on diagnostic imaging of right kidney: Secondary | ICD-10-CM | POA: Insufficient documentation

## 2015-09-23 DIAGNOSIS — R112 Nausea with vomiting, unspecified: Principal | ICD-10-CM | POA: Diagnosis present

## 2015-09-23 DIAGNOSIS — E876 Hypokalemia: Secondary | ICD-10-CM | POA: Diagnosis not present

## 2015-09-23 DIAGNOSIS — R1110 Vomiting, unspecified: Secondary | ICD-10-CM | POA: Diagnosis present

## 2015-09-23 DIAGNOSIS — J45909 Unspecified asthma, uncomplicated: Secondary | ICD-10-CM | POA: Diagnosis not present

## 2015-09-23 DIAGNOSIS — E118 Type 2 diabetes mellitus with unspecified complications: Secondary | ICD-10-CM

## 2015-09-23 DIAGNOSIS — Z79899 Other long term (current) drug therapy: Secondary | ICD-10-CM | POA: Insufficient documentation

## 2015-09-23 DIAGNOSIS — Z794 Long term (current) use of insulin: Secondary | ICD-10-CM | POA: Insufficient documentation

## 2015-09-23 DIAGNOSIS — R778 Other specified abnormalities of plasma proteins: Secondary | ICD-10-CM

## 2015-09-23 DIAGNOSIS — R1013 Epigastric pain: Secondary | ICD-10-CM

## 2015-09-23 DIAGNOSIS — E039 Hypothyroidism, unspecified: Secondary | ICD-10-CM | POA: Insufficient documentation

## 2015-09-23 LAB — COMPREHENSIVE METABOLIC PANEL
ALK PHOS: 71 U/L (ref 38–126)
ALT: 17 U/L (ref 14–54)
ANION GAP: 11 (ref 5–15)
AST: 19 U/L (ref 15–41)
Albumin: 3.9 g/dL (ref 3.5–5.0)
BILIRUBIN TOTAL: 1 mg/dL (ref 0.3–1.2)
BUN: 17 mg/dL (ref 6–20)
CALCIUM: 9.5 mg/dL (ref 8.9–10.3)
CO2: 32 mmol/L (ref 22–32)
Chloride: 94 mmol/L — ABNORMAL LOW (ref 101–111)
Creatinine, Ser: 1.04 mg/dL — ABNORMAL HIGH (ref 0.44–1.00)
GFR, EST NON AFRICAN AMERICAN: 54 mL/min — AB (ref >60)
Glucose, Bld: 235 mg/dL — ABNORMAL HIGH (ref 65–99)
POTASSIUM: 3.1 mmol/L — AB (ref 3.5–5.1)
Sodium: 137 mmol/L (ref 135–145)
TOTAL PROTEIN: 8.3 g/dL — AB (ref 6.5–8.1)

## 2015-09-23 LAB — TROPONIN I
TROPONIN I: 0.1 ng/mL — AB (ref <0.03)
Troponin I: 0.09 ng/mL (ref <0.03)
Troponin I: 0.08 ng/mL (ref <0.03)

## 2015-09-23 LAB — CBC
HEMATOCRIT: 42.2 % (ref 36.0–46.0)
HEMOGLOBIN: 14.4 g/dL (ref 12.0–15.0)
MCH: 29.7 pg (ref 26.0–34.0)
MCHC: 34.1 g/dL (ref 30.0–36.0)
MCV: 87 fL (ref 78.0–100.0)
Platelets: 185 10*3/uL (ref 150–400)
RBC: 4.85 MIL/uL (ref 3.87–5.11)
RDW: 14.4 % (ref 11.5–15.5)
WBC: 9.3 10*3/uL (ref 4.0–10.5)

## 2015-09-23 LAB — URINE MICROSCOPIC-ADD ON

## 2015-09-23 LAB — URINALYSIS, ROUTINE W REFLEX MICROSCOPIC
BILIRUBIN URINE: NEGATIVE
Glucose, UA: 500 mg/dL — AB
KETONES UR: NEGATIVE mg/dL
LEUKOCYTES UA: NEGATIVE
NITRITE: NEGATIVE
PH: 6 (ref 5.0–8.0)
Protein, ur: 100 mg/dL — AB
SPECIFIC GRAVITY, URINE: 1.044 — AB (ref 1.005–1.030)

## 2015-09-23 LAB — GLUCOSE, CAPILLARY
Glucose-Capillary: 231 mg/dL — ABNORMAL HIGH (ref 65–99)
Glucose-Capillary: 219 mg/dL — ABNORMAL HIGH (ref 65–99)

## 2015-09-23 LAB — LIPASE, BLOOD: Lipase: 18 U/L (ref 11–51)

## 2015-09-23 LAB — TSH: TSH: 0.958 u[IU]/mL (ref 0.350–4.500)

## 2015-09-23 MED ORDER — IRBESARTAN 75 MG PO TABS
75.0000 mg | ORAL_TABLET | Freq: Every day | ORAL | Status: DC
  Administered 2015-09-23 – 2015-09-25 (×3): 75 mg via ORAL
  Filled 2015-09-23 (×3): qty 1

## 2015-09-23 MED ORDER — VITAMIN D (ERGOCALCIFEROL) 1.25 MG (50000 UNIT) PO CAPS
50000.0000 [IU] | ORAL_CAPSULE | ORAL | Status: DC
  Administered 2015-09-25: 50000 [IU] via ORAL
  Filled 2015-09-23: qty 1

## 2015-09-23 MED ORDER — IOPAMIDOL (ISOVUE-300) INJECTION 61%
100.0000 mL | Freq: Once | INTRAVENOUS | Status: AC | PRN
  Administered 2015-09-23: 100 mL via INTRAVENOUS

## 2015-09-23 MED ORDER — ENOXAPARIN SODIUM 60 MG/0.6ML ~~LOC~~ SOLN
60.0000 mg | SUBCUTANEOUS | Status: DC
  Administered 2015-09-23 – 2015-09-24 (×2): 60 mg via SUBCUTANEOUS
  Filled 2015-09-23 (×2): qty 0.6

## 2015-09-23 MED ORDER — POTASSIUM CHLORIDE CRYS ER 20 MEQ PO TBCR
40.0000 meq | EXTENDED_RELEASE_TABLET | Freq: Once | ORAL | Status: AC
  Administered 2015-09-23: 40 meq via ORAL
  Filled 2015-09-23: qty 2

## 2015-09-23 MED ORDER — ASPIRIN EC 81 MG PO TBEC
81.0000 mg | DELAYED_RELEASE_TABLET | Freq: Every day | ORAL | Status: DC
  Administered 2015-09-23 – 2015-09-25 (×3): 81 mg via ORAL
  Filled 2015-09-23 (×3): qty 1

## 2015-09-23 MED ORDER — CLONIDINE HCL 0.1 MG/24HR TD PTWK: 0.1000 mg | MEDICATED_PATCH | TRANSDERMAL | Status: DC

## 2015-09-23 MED ORDER — AMLODIPINE BESYLATE 5 MG PO TABS
5.0000 mg | ORAL_TABLET | Freq: Every day | ORAL | Status: DC
  Administered 2015-09-23 – 2015-09-24 (×2): 5 mg via ORAL
  Filled 2015-09-23 (×2): qty 1

## 2015-09-23 MED ORDER — CYCLOBENZAPRINE HCL 5 MG PO TABS
5.0000 mg | ORAL_TABLET | Freq: Two times a day (BID) | ORAL | Status: DC | PRN
  Administered 2015-09-25: 5 mg via ORAL
  Filled 2015-09-23: qty 1

## 2015-09-23 MED ORDER — DOXAZOSIN MESYLATE 4 MG PO TABS
4.0000 mg | ORAL_TABLET | Freq: Every day | ORAL | Status: DC
  Administered 2015-09-23 – 2015-09-24 (×2): 4 mg via ORAL
  Filled 2015-09-23 (×2): qty 1

## 2015-09-23 MED ORDER — PANTOPRAZOLE SODIUM 20 MG PO TBEC
20.0000 mg | DELAYED_RELEASE_TABLET | Freq: Two times a day (BID) | ORAL | Status: DC
  Administered 2015-09-23 – 2015-09-25 (×4): 20 mg via ORAL
  Filled 2015-09-23 (×4): qty 1

## 2015-09-23 MED ORDER — ALBUTEROL SULFATE (2.5 MG/3ML) 0.083% IN NEBU: 2.5000 mg | INHALATION_SOLUTION | Freq: Four times a day (QID) | RESPIRATORY_TRACT | Status: DC | PRN

## 2015-09-23 MED ORDER — ALPRAZOLAM 0.25 MG PO TABS
0.1250 mg | ORAL_TABLET | Freq: Three times a day (TID) | ORAL | Status: DC | PRN
  Administered 2015-09-24 – 2015-09-25 (×3): 0.25 mg via ORAL
  Filled 2015-09-23 (×3): qty 1

## 2015-09-23 MED ORDER — SODIUM CHLORIDE 0.9 % IV BOLUS (SEPSIS)
1000.0000 mL | Freq: Once | INTRAVENOUS | Status: AC
  Administered 2015-09-23: 1000 mL via INTRAVENOUS

## 2015-09-23 MED ORDER — ACETAMINOPHEN 325 MG PO TABS
650.0000 mg | ORAL_TABLET | Freq: Once | ORAL | Status: AC
  Administered 2015-09-23: 650 mg via ORAL
  Filled 2015-09-23: qty 2

## 2015-09-23 MED ORDER — IOPAMIDOL (ISOVUE-300) INJECTION 61%
30.0000 mL | Freq: Once | INTRAVENOUS | Status: AC | PRN
  Administered 2015-09-23: 30 mL via ORAL

## 2015-09-23 MED ORDER — LEVOTHYROXINE SODIUM 100 MCG PO TABS
200.0000 ug | ORAL_TABLET | Freq: Every day | ORAL | Status: DC
Start: 2015-09-24 — End: 2015-09-25
  Administered 2015-09-24 – 2015-09-25 (×3): 200 ug via ORAL
  Filled 2015-09-23 (×3): qty 2

## 2015-09-23 MED ORDER — INSULIN ASPART 100 UNIT/ML ~~LOC~~ SOLN
0.0000 [IU] | Freq: Three times a day (TID) | SUBCUTANEOUS | Status: DC
  Administered 2015-09-23: 5 [IU] via SUBCUTANEOUS
  Administered 2015-09-24: 3 [IU] via SUBCUTANEOUS
  Administered 2015-09-24 – 2015-09-25 (×3): 2 [IU] via SUBCUTANEOUS
  Administered 2015-09-25: 3 [IU] via SUBCUTANEOUS

## 2015-09-23 MED ORDER — INSULIN GLARGINE 100 UNIT/ML ~~LOC~~ SOLN
70.0000 [IU] | Freq: Every day | SUBCUTANEOUS | Status: DC
  Administered 2015-09-23 – 2015-09-24 (×2): 70 [IU] via SUBCUTANEOUS
  Filled 2015-09-23 (×3): qty 0.7

## 2015-09-23 MED ORDER — ASPIRIN 81 MG PO CHEW
324.0000 mg | CHEWABLE_TABLET | Freq: Once | ORAL | Status: AC
  Administered 2015-09-23: 324 mg via ORAL
  Filled 2015-09-23: qty 4

## 2015-09-23 MED ORDER — INSULIN ASPART 100 UNIT/ML ~~LOC~~ SOLN
0.0000 [IU] | Freq: Every day | SUBCUTANEOUS | Status: DC
  Administered 2015-09-23: 2 [IU] via SUBCUTANEOUS

## 2015-09-23 MED ORDER — METOCLOPRAMIDE HCL 5 MG/ML IJ SOLN
5.0000 mg | Freq: Once | INTRAMUSCULAR | Status: AC
  Administered 2015-09-23: 5 mg via INTRAVENOUS
  Filled 2015-09-23: qty 2

## 2015-09-23 MED ORDER — ONDANSETRON HCL 4 MG PO TABS: 4.0000 mg | ORAL_TABLET | Freq: Four times a day (QID) | ORAL | Status: DC | PRN

## 2015-09-23 MED ORDER — HYDROCHLOROTHIAZIDE 25 MG PO TABS
25.0000 mg | ORAL_TABLET | Freq: Two times a day (BID) | ORAL | Status: DC
  Administered 2015-09-23 – 2015-09-25 (×4): 25 mg via ORAL
  Filled 2015-09-23 (×4): qty 1

## 2015-09-23 MED ORDER — OXYCODONE-ACETAMINOPHEN 5-325 MG PO TABS
0.5000 | ORAL_TABLET | Freq: Four times a day (QID) | ORAL | Status: DC | PRN
  Administered 2015-09-23 – 2015-09-25 (×2): 0.5 via ORAL
  Filled 2015-09-23 (×2): qty 1

## 2015-09-23 MED ORDER — ATORVASTATIN CALCIUM 40 MG PO TABS
40.0000 mg | ORAL_TABLET | Freq: Every day | ORAL | Status: DC
  Administered 2015-09-23 – 2015-09-24 (×2): 40 mg via ORAL
  Filled 2015-09-23 (×2): qty 1

## 2015-09-23 MED ORDER — METOPROLOL SUCCINATE ER 50 MG PO TB24
50.0000 mg | ORAL_TABLET | Freq: Every day | ORAL | Status: DC
  Administered 2015-09-23 – 2015-09-24 (×2): 50 mg via ORAL
  Filled 2015-09-23 (×3): qty 1

## 2015-09-23 MED ORDER — ONDANSETRON HCL 4 MG/2ML IJ SOLN: 4.0000 mg | Freq: Four times a day (QID) | INTRAMUSCULAR | Status: DC | PRN

## 2015-09-23 NOTE — ED Notes (Signed)
WILL TRANSPORT PT TO 4E 1414-1. AAOX4. PT IN NO APPARENT DISTRESS. THE OPPORTUNITY TO ASK QUESTIONS WAS PROVIDED.
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
## 2015-09-23 NOTE — ED Notes (Signed)
MD at bedside. 

## 2015-09-23 NOTE — ED Notes (Signed)
Pt can go to floor at 14:40 

## 2015-09-23 NOTE — ED Triage Notes (Signed)
Pt reports nausea and vomiting beginning yesterday around 1400.  Denies diarrhea or changes in urination.  Reports chest pain and SOB while vomiting.  Denies any current SOB or chest pain.  Denies abdominal pain.  Denies any history of headaches or taking any pain medication for HA.

## 2015-09-23 NOTE — H&P (Signed)
History and Physical    [name] [id number] DOB: Oct 31, 1945 DOA: 09/23/2015  PCP: Wenda Low, MD  Outpatient Specialists: Endocrinology, Dr. Altheimer Patient coming from: home  Chief Complaint: nausea/vomiting  HPI: [name] is a 70 y.o. female with medical history significant of DM, HTN, GERD, hypothyroidism, OSA, presents to the ER with chief complaint of intractable nausea and vomiting all night. Patient had dinner last night and ate a banana sandwitch, went to bed, and woke up overnight with severe nausea and vomiting and has had multiple episodes since. In the ED her symptoms appear improved. She denies ever having anything like this. She denies any fever or chills, denies any chest pain (however did complain of chest pain earlier on to EDP) however felt diaphoretic and short of breath with her emesis. She has no lightheadedness or dizziness, no weakness.   ED Course: in the ER her vitals are stable, blood work revealed elevated glucose to 235, Cr 1.04, K 3.1 and it showed a troponin of 0.1. TRH asked to admit out of concern for anginal equivalent in a patient with DM.  Review of Systems: As per HPI otherwise 10 point review of systems negative.   Past Medical History:  Diagnosis Date  . Anxiety   . Arthritis   . Asthma   . Diabetes mellitus   . GERD (gastroesophageal reflux disease)   . Hyperlipidemia   . Hypertension   . Hypothyroid   . Sleep apnea    use CPAP nightly    Past Surgical History:  Procedure Laterality Date  . ABDOMINAL HYSTERECTOMY    . CHOLECYSTECTOMY    . COLONOSCOPY WITH PROPOFOL N/A 08/29/2012   Procedure: COLONOSCOPY WITH PROPOFOL;  Surgeon: Garlan Fair, MD;  Location: [facility];  Service: Endoscopy;  Laterality: N/A;  . EYE SURGERY       reports that she has never smoked. She has never used smokeless tobacco. She reports that she does not drink alcohol or use drugs.  Allergies  Allergen Reactions  . Esomeprazole  Magnesium Shortness Of Breath  . Augmentin [Amoxicillin-Pot Clavulanate]     Not sure  . Avelox [Moxifloxacin Hcl In Nacl] Nausea Only  . Demerol [Meperidine] Other (See Comments)    Hallucination, pass out  . Keflex [Cephalexin] Nausea Only  . Levofloxacin Nausea Only  . Lisinopril Cough    Family History  Problem Relation Age of Onset  . Adopted: Yes    Prior to Admission medications   Medication Sig Start Date End Date Taking? Authorizing Provider  albuterol (PROVENTIL HFA;VENTOLIN HFA) 108 (90 BASE) MCG/ACT inhaler Inhale 2 puffs into the lungs every 6 (six) hours as needed for wheezing.   Yes Historical Provider, MD  ALPRAZolam (XANAX) 0.25 MG tablet Take 0.125-0.25 mg by mouth 3 (three) times daily as needed for anxiety.    Yes Historical Provider, MD  amLODipine (NORVASC) 5 MG tablet Take 5 mg by mouth at bedtime.   Yes Historical Provider, MD  aspirin EC 81 MG tablet Take 81 mg by mouth daily.   Yes Historical Provider, MD  atorvastatin (LIPITOR) 40 MG tablet Take 40 mg by mouth at bedtime.    Yes Historical Provider, MD  candesartan (ATACAND) 32 MG tablet Take 32 mg by mouth at bedtime.   Yes Historical Provider, MD  cloNIDine (CATAPRES - DOSED IN MG/24 HR) 0.1 mg/24hr patch Place 1 patch onto the skin every Sunday. On Sundays   Yes Historical Provider, MD  cyclobenzaprine (FLEXERIL) 5 MG tablet  Take 1 tablet (5 mg total) by mouth 2 (two) times daily as needed for muscle spasms. 09/17/15  Yes Margarita Mail, PA-C  doxazosin (CARDURA) 8 MG tablet Take 4 mg by mouth at bedtime.    Yes Historical Provider, MD  hydrochlorothiazide (HYDRODIURIL) 25 MG tablet Take 25 mg by mouth 2 (two) times daily.    Yes Historical Provider, MD  insulin glargine (LANTUS) 100 UNIT/ML injection Inject 85 Units into the skin at bedtime.    Yes Historical Provider, MD  insulin lispro (HUMALOG) 100 UNIT/ML injection Inject 4-16 Units into the skin 3 (three) times daily before meals. Sliding scale, she  decides how much to give depending on how much she eats   Yes Historical Provider, MD  levothyroxine (SYNTHROID, LEVOTHROID) 200 MCG tablet Take 200 mcg by mouth daily before breakfast.    Yes Historical Provider, MD  metoprolol (TOPROL-XL) 50 MG 24 hr tablet Take 50 mg by mouth daily.    Yes Historical Provider, MD  oxyCODONE-acetaminophen (PERCOCET) 5-325 MG tablet Take 1 tablet by mouth every 6 (six) hours as needed for severe pain. Patient taking differently: Take 0.5 tablets by mouth every 6 (six) hours as needed for severe pain.  09/17/15  Yes Margarita Mail, PA-C  pantoprazole (PROTONIX) 20 MG tablet Take 1 tablet (20 mg total) by mouth 2 (two) times daily. 07/17/14  Yes Virgel Manifold, MD  potassium chloride SA (K-DUR,KLOR-CON) 20 MEQ tablet Take 20 mEq by mouth every other day.   Yes Historical Provider, MD  promethazine (PHENERGAN) 25 MG suppository Place 25 mg rectally every 6 (six) hours as needed for nausea or vomiting.   Yes Historical Provider, MD  Vitamin D, Ergocalciferol, (DRISDOL) 50000 UNITS CAPS Take 50,000 Units by mouth every 7 (seven) days. Thursdays   Yes Historical Provider, MD    Physical Exam: Vitals:   09/23/15 0935 09/23/15 0936  BP: 126/64   Pulse: 86   Resp: 16   Temp: 98.5 F (36.9 C)   TempSrc: Oral   SpO2: 99%   Weight:  (!) 140.6 kg (310 lb)  Height:  5\' 8"  (1.727 m)      Constitutional: NAD, obese  Vitals:   09/23/15 0935 09/23/15 0936  BP: 126/64   Pulse: 86   Resp: 16   Temp: 98.5 F (36.9 C)   TempSrc: Oral   SpO2: 99%   Weight:  (!) 140.6 kg (310 lb)  Height:  5\' 8"  (1.727 m)   Eyes: PERRL, lids and conjunctivae normal ENMT: Mucous membranes are moist. Posterior pharynx clear of any exudate or lesions. Neck: thick Respiratory: clear to auscultation bilaterally, no wheezing, no crackles. Normal respiratory effort. No accessory muscle use. Decreased breath sounds due to obesity Cardiovascular: Regular rate and rhythm, no murmurs / rubs /  gallops. No extremity edema. 2+ pedal pulses.  Abdomen: no tenderness, no masses palpated. Bowel sounds positive.  Musculoskeletal: no clubbing / cyanosis. Normal muscle tone.  Skin: no rashes, lesions, ulcers. No induration Neurologic: CN 2-12 grossly intact. Strength 5/5 in all 4.  Psychiatric: Normal judgment and insight. Alert and oriented x 3. Normal mood.   Labs on Admission: I have personally reviewed following labs and imaging studies  CBC:  Recent Labs Lab 09/23/15 1019  WBC 9.3  HGB 14.4  HCT 42.2  MCV 87.0  PLT 123XX123   Basic Metabolic Panel:  Recent Labs Lab 09/23/15 1019  NA 137  K 3.1*  CL 94*  CO2 32  GLUCOSE 235*  BUN  17  CREATININE 1.04*  CALCIUM 9.5   GFR: Estimated Creatinine Clearance: 76.2 mL/min (by C-G formula based on SCr of 1.04 mg/dL). Liver Function Tests:  Recent Labs Lab 09/23/15 1019  AST 19  ALT 17  ALKPHOS 71  BILITOT 1.0  PROT 8.3*  ALBUMIN 3.9    Recent Labs Lab 09/23/15 1019  LIPASE 18   No results for input(s): AMMONIA in the last 168 hours. Coagulation Profile: No results for input(s): INR, PROTIME in the last 168 hours. Cardiac Enzymes:  Recent Labs Lab 09/23/15 1019  TROPONINI 0.10*   BNP (last 3 results) No results for input(s): PROBNP in the last 8760 hours. HbA1C: No results for input(s): HGBA1C in the last 72 hours. CBG: No results for input(s): GLUCAP in the last 168 hours. Lipid Profile: No results for input(s): CHOL, HDL, LDLCALC, TRIG, CHOLHDL, LDLDIRECT in the last 72 hours. Thyroid Function Tests: No results for input(s): TSH, T4TOTAL, FREET4, T3FREE, THYROIDAB in the last 72 hours. Anemia Panel: No results for input(s): VITAMINB12, FOLATE, FERRITIN, TIBC, IRON, RETICCTPCT in the last 72 hours. Urine analysis:    Component Value Date/Time   COLORURINE AMBER (A) 09/06/2014 1547   APPEARANCEUR CLOUDY (A) 09/06/2014 1547   LABSPEC 1.025 09/06/2014 1547   PHURINE 5.0 09/06/2014 1547   GLUCOSEU  NEGATIVE 09/06/2014 1547   HGBUR NEGATIVE 09/06/2014 1547   BILIRUBINUR SMALL (A) 09/06/2014 1547   KETONESUR NEGATIVE 09/06/2014 1547   PROTEINUR NEGATIVE 09/06/2014 1547   UROBILINOGEN 1.0 09/06/2014 1547   NITRITE NEGATIVE 09/06/2014 1547   LEUKOCYTESUR NEGATIVE 09/06/2014 1547   Sepsis Labs: @LABRCNTIP (procalcitonin:4,lacticidven:4) )No results found for this or any previous visit (from the past 240 hour(s)).   Radiological Exams on Admission: Ct Abdomen Pelvis W Contrast  Result Date: 09/23/2015 CLINICAL DATA:  69 year old female with abdominal pain nausea and vomiting since 1400 hours yesterday. Initial encounter. EXAM: CT ABDOMEN AND PELVIS WITH CONTRAST TECHNIQUE: Multidetector CT imaging of the abdomen and pelvis was performed using the standard protocol following bolus administration of intravenous contrast. CONTRAST:  149mL ISOVUE-300 IOPAMIDOL (ISOVUE-300) INJECTION 61%, 32mL ISOVUE-300 IOPAMIDOL (ISOVUE-300) INJECTION 61% COMPARISON:  CT Abdomen and Pelvis 09/06/2014 and earlier. FINDINGS: Large body habitus. Lower chest: Motion artifact at the lung bases. Chronic elevated hemidiaphragms more so the right. Lung base atelectasis. Cardiomegaly. No pericardial or pleural effusion. Hepatobiliary: No abdominal free air or free fluid. Surgically absent gallbladder as before. Liver is stable and within normal limits. Pancreas: Negative. Spleen: Negative. Adrenals/Urinary Tract: Negative adrenal glands. Right renal midpole contour deformity with a small area of dystrophic appearing calcification is new since 2005 but appears similar to 2015. The appearance is such that a hypo enhancing 3 cm midpole mass is difficult to exclude (e.g. On series 2, image 38). But this might instead reflect renal cortical scarring. Otherwise bilateral renal enhancement and contrast excretion is normal. Unremarkable urinary bladder.  Numerous pelvic phleboliths. Stomach/Bowel: Negative rectum with mild retained gas  since tool. Mildly redundant but otherwise negative sigmoid colon. Motion artifact intermittently in the abdomen. Negative left colon. Negative transverse colon aside from retained stool and some redundancy. Negative right colon aside from retained stool. Appendix is diminutive or absent. Oral contrast has not yet reached the terminal ileum which appears normal. The small volume of pelvic free fluid is along the distal loop of small bowel which otherwise appears within normal limits. Oral contrast is located just upstream to this loop. No dilated small bowel. Decompressed stomach. Much of the duodenum is decompressed.  Vascular/Lymphatic: Aortoiliac calcified atherosclerosis noted. Major arterial structures are patent. No abdominal lymphadenopathy. Reproductive: Surgically absent uterus as before. Diminutive or absent adnexa. Other: Small volume of pelvic free fluid (series 2, image 86). Musculoskeletal: Degenerative changes throughout the spine. No acute osseous abnormality identified. IMPRESSION: 1. Small volume of abnormal but nonspecific free fluid in the pelvis. This might be related to a mild enteritis. No focal bowel inflammation is identified. No bowel obstruction. 2. Subtle 3 cm abnormal area in the right renal midpole. This might represent chronic scarring but hypo enhancing right renal mass is difficult to exclude. Recommend further characterization with routine outpatient Abdomen MRI without and with contrast (renal mass protocol). 3.  Calcified aortic atherosclerosis.  Cardiomegaly. 4. Mild lung base atelectasis. Electronically Signed   By: Genevie Ann M.D.   On: 09/23/2015 11:57    EKG: Independently reviewed. Sinus rhythm  Assessment/Plan Active Problems:   Intractable nausea and vomiting   Diabetes mellitus (HCC)   CKD (chronic kidney disease), stage III   Hypokalemia   Elevated troponin  Intractable nausea and vomiting - probably related to food however cannot exclude anginal equivalent in  this diabetic patient - admit to telemetry, monitor overnight, cycle cardiac enzymes x 3 - no history of gastroparesis - symptoms improved in the ER - CT with small non specific free fluid in the pelvis, potentially related to mild enteritis. She has no diarrhea, may not be clinically significant.   CKD III - Cr at baseline  Hypokalemia - due to #1, replete  DM - check A1C to determine control - Lantus + SSI  OSA - CPAP  3 cm abnormal area in the right renal midpole - MRI as an outpatient    DVT prophylaxis: Lovenox  Code Status: Full  Family Communication: significant other bedside Disposition Plan: admit to tele Consults called: none  Admission status: observation    Marzetta Board, MD [facility] Pager [phone]  If 7PM-7AM, please contact night-coverage [email or web address] Password [email or web address]  09/23/2015, 1:00 PM

## 2015-09-24 DIAGNOSIS — R112 Nausea with vomiting, unspecified: Secondary | ICD-10-CM | POA: Diagnosis not present

## 2015-09-24 DIAGNOSIS — E876 Hypokalemia: Secondary | ICD-10-CM | POA: Diagnosis not present

## 2015-09-24 DIAGNOSIS — G43A1 Cyclical vomiting, intractable: Secondary | ICD-10-CM | POA: Diagnosis not present

## 2015-09-24 DIAGNOSIS — R7989 Other specified abnormal findings of blood chemistry: Secondary | ICD-10-CM | POA: Diagnosis not present

## 2015-09-24 DIAGNOSIS — N183 Chronic kidney disease, stage 3 (moderate): Secondary | ICD-10-CM

## 2015-09-24 LAB — GLUCOSE, CAPILLARY
GLUCOSE-CAPILLARY: 187 mg/dL — AB (ref 65–99)
GLUCOSE-CAPILLARY: 142 mg/dL — AB (ref 65–99)
Glucose-Capillary: 129 mg/dL — ABNORMAL HIGH (ref 65–99)
Glucose-Capillary: 171 mg/dL — ABNORMAL HIGH (ref 65–99)
Glucose-Capillary: 163 mg/dL — ABNORMAL HIGH (ref 65–99)

## 2015-09-24 LAB — CBC
HCT: 38.1 % (ref 36.0–46.0)
Hemoglobin: 12.5 g/dL (ref 12.0–15.0)
MCH: 29.3 pg (ref 26.0–34.0)
MCHC: 32.8 g/dL (ref 30.0–36.0)
MCV: 89.4 fL (ref 78.0–100.0)
PLATELETS: 177 10*3/uL (ref 150–400)
RBC: 4.26 MIL/uL (ref 3.87–5.11)
RDW: 14.8 % (ref 11.5–15.5)
WBC: 7.9 10*3/uL (ref 4.0–10.5)

## 2015-09-24 LAB — BASIC METABOLIC PANEL
ANION GAP: 7 (ref 5–15)
Anion gap: 7 (ref 5–15)
BUN: 26 mg/dL — AB (ref 6–20)
BUN: 34 mg/dL — ABNORMAL HIGH (ref 6–20)
CALCIUM: 9.1 mg/dL (ref 8.9–10.3)
CHLORIDE: 99 mmol/L — AB (ref 101–111)
CO2: 34 mmol/L — AB (ref 22–32)
CO2: 33 mmol/L — ABNORMAL HIGH (ref 22–32)
CREATININE: 1.23 mg/dL — AB (ref 0.44–1.00)
Calcium: 9.2 mg/dL (ref 8.9–10.3)
Chloride: 97 mmol/L — ABNORMAL LOW (ref 101–111)
Creatinine, Ser: 1.53 mg/dL — ABNORMAL HIGH (ref 0.44–1.00)
GFR calc Af Amer: 51 mL/min — ABNORMAL LOW (ref >60)
GFR calc Af Amer: 39 mL/min — ABNORMAL LOW (ref >60)
GFR calc non Af Amer: 44 mL/min — ABNORMAL LOW (ref >60)
GFR, EST NON AFRICAN AMERICAN: 34 mL/min — AB (ref >60)
Glucose, Bld: 123 mg/dL — ABNORMAL HIGH (ref 65–99)
Glucose, Bld: 160 mg/dL — ABNORMAL HIGH (ref 65–99)
POTASSIUM: 3.4 mmol/L — AB (ref 3.5–5.1)
Potassium: 3.1 mmol/L — ABNORMAL LOW (ref 3.5–5.1)
SODIUM: 137 mmol/L (ref 135–145)
Sodium: 140 mmol/L (ref 135–145)

## 2015-09-24 LAB — HEMOGLOBIN A1C
HEMOGLOBIN A1C: 7.9 % — AB (ref 4.8–5.6)
MEAN PLASMA GLUCOSE: 180 mg/dL

## 2015-09-24 LAB — TROPONIN I: Troponin I: 0.07 ng/mL (ref <0.03)

## 2015-09-24 MED ORDER — POTASSIUM CHLORIDE CRYS ER 20 MEQ PO TBCR
40.0000 meq | EXTENDED_RELEASE_TABLET | Freq: Once | ORAL | Status: AC
Start: 2015-09-24 — End: 2015-09-24
  Administered 2015-09-24: 40 meq via ORAL
  Filled 2015-09-24: qty 2

## 2015-09-24 MED ORDER — SODIUM CHLORIDE 0.9 % IV SOLN
INTRAVENOUS | Status: DC
  Administered 2015-09-24 – 2015-09-25 (×2): via INTRAVENOUS

## 2015-09-24 NOTE — Care Management Note (Signed)
Case Management Note  Patient Details  Name: EMARIE ZIEGER MRN: [id number] Date of Birth: [birth year]  Subjective/Objective:  70 y/o f admitted w/intractable n/v. From home. Private duty aides.                 Action/Plan:d/c plan home.   Expected Discharge Date:   (unknown)               Expected Discharge Plan:  Home/Self Care  In-House Referral:     Discharge planning Services     Post Acute Care Choice:  Resumption of Svcs/PTA Provider (private duty care-custodial level aides-8hrs/week) Choice offered to:     DME Arranged:    DME Agency:     HH Arranged:    [place] Agency:     Status of Service:  In process, will continue to follow  If discussed at Long Length of Stay Meetings, dates discussed:    Additional Comments:  Dessa Phi, RN 09/24/2015, 1:42 PM

## 2015-09-24 NOTE — Care Management Note (Signed)
Case Management Note  Patient Details  Name: [name] MRN: [id number] Date of Birth: 10-31-1945  Subjective/Objective: Advanced Beneficiary Notice given.Patient voiced understanding.MD notified.                   Action/Plan:   Expected Discharge Date:   (unknown)               Expected Discharge Plan:  Home/Self Care  In-House Referral:     Discharge planning Services     Post Acute Care Choice:  Resumption of Svcs/PTA Provider (private duty care-custodial level aides-8hrs/week) Choice offered to:     DME Arranged:    DME Agency:     HH Arranged:    [place] Agency:     Status of Service:  In process, will continue to follow  If discussed at Long Length of Stay Meetings, dates discussed:    Additional Comments:  Dessa Phi, RN 09/24/2015, 3:04 PM

## 2015-09-24 NOTE — Progress Notes (Signed)
Pt has refused CPAP for the night.  Machine is at bedside if Pt decides to utilize.  RT to monitor and assess as needed.

## 2015-09-24 NOTE — Progress Notes (Signed)
Patient ID: [name], female   DOB: Oct 11, 1945, 70 y.o.   MRN: [id number]    PROGRESS NOTE    [name]  [id number] DOB: 1945-08-04 DOA: 09/23/2015  PCP: Wenda Low, MD   Brief Narrative:  70 y.o. female with medical history significant of DM, HTN, GERD, hypothyroidism, OSA, presented to the ER with chief complaint of intractable nausea and vomiting that started night prior to this admission. Patient reports having dinner that night with no concerns and she suddenly woke up vomiting, nonbloody but had multiple episodes. Upon arrival to emergency department, vomiting has resolved the patient remained remained nauseated. She has denied fevers and chills, no chest pain or shortness of breath.  Assessment & Plan:   Assessment/Plan: Intractable nausea and vomiting - probably related to food however cannot exclude anginal equivalent in this diabetic patient - Cardiac enzymes have been cycled, rather flat, minimally elevated and not consistent with ACS - CT abdomen with small non specific free fluid in the pelvis, potentially related to mild enteritis - Patient denies nausea or vomiting this morning, diet has been advanced and we'll see if patient tolerating - Telemetry and EKG reviewed, patient denies chest pain this morning  CKD III - Cr at baseline ~ 1.3 based on record review in the past year - Creatinine has been stable on admission however, it increased overnight and suspect due to prerenal etiology in the setting of vomiting and poor oral intake - I think it's reasonable to advance patient's diet as she denies nausea and vomiting this morning, patient also advised to continue with fluid intake - We'll repeat electrolyte panel in the morning and if creatinine is stable I think patient will be okay for discharge  Hypokalemia - Secondary to vomiting, we'll supplement and repeat BMP in the morning  Essential hypertension - Continue home medical regimen  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   DVT prophylaxis: Lovenox SQ Code Status: Full Family Communication: Patient and husband at bedside  Disposition Plan: Home in morning if creatinine stable and patient able to tolerate oral intake  Consultants:   None  Procedures:   None  Antimicrobials:   None   Subjective: No events overnight, wants to try eating.  Objective: Vitals:   09/23/15 1440 09/23/15 2100 09/24/15 0440 09/24/15 0910  BP: (!) 126/53 (!) 131/58 (!) 122/54 121/61  Pulse: 70 62 63 65  Resp: 18 16 18    Temp: 97.8 F (36.6 C) 98.3 F (36.8 C) 98.1 F (36.7 C)   TempSrc: Oral Oral Oral   SpO2: 97% 99% 96%   Weight: (!) 144.4 kg (318 lb 5.5 oz)     Height: 5\' 8"  (1.727 m)       Intake/Output Summary (Last 24 hours) at 09/24/15 1114 Last data filed at 09/24/15 1034  Gross per 24 hour  Intake              240 ml  Output                0 ml  Net              240 ml   Filed Weights   09/23/15 0936 09/23/15 1440  Weight: Marland Kitchen)  140.6 kg (310 lb) (!) 144.4 kg (318 lb 5.5 oz)    Examination:  General exam: Appears calm and comfortable  Respiratory system: Clear to auscultation. Respiratory effort normal. Cardiovascular system: S1 & S2 heard, RRR. No JVD, murmurs, rubs, gallops or clicks. No pedal edema. Gastrointestinal system: Abdomen is nondistended, soft and nontender. No organomegaly or masses felt. Normal bowel sounds heard. Central nervous system: Alert and oriented. No focal neurological deficits.  Data Reviewed: I have personally reviewed following labs and imaging studies  CBC:  Recent Labs Lab 09/23/15 1019 09/24/15 0114  WBC 9.3 7.9  HGB 14.4 12.5  HCT 42.2 38.1  MCV 87.0 89.4  PLT 185  123XX123   Basic Metabolic Panel:  Recent Labs Lab 09/23/15 1019 09/24/15 0114  NA 137 140  K 3.1* 3.1*  CL 94* 99*  CO2 32 34*  GLUCOSE 235* 123*  BUN 17 26*  CREATININE 1.04* 1.23*  CALCIUM 9.5 9.1   Liver Function Tests:  Recent Labs Lab 09/23/15 1019  AST 19  ALT 17  ALKPHOS 71  BILITOT 1.0  PROT 8.3*  ALBUMIN 3.9    Recent Labs Lab 09/23/15 1019  LIPASE 18   Cardiac Enzymes:  Recent Labs Lab 09/23/15 1019 09/23/15 1335 09/23/15 1940 09/24/15 0114  TROPONINI 0.10* 0.09* 0.08* 0.07*   BNP (last 3 results) No results for input(s): PROBNP in the last 8760 hours. HbA1C:  Recent Labs  09/23/15 1335  HGBA1C 7.9*   CBG:  Recent Labs Lab 09/23/15 1726 09/23/15 2055 09/24/15 0807 09/24/15 1023  GLUCAP 231* 219* 129* 171*   Lipid Profile: No results for input(s): CHOL, HDL, LDLCALC, TRIG, CHOLHDL, LDLDIRECT in the last 72 hours. Thyroid Function Tests:  Recent Labs  09/23/15 1335  TSH 0.958   Anemia Panel: No results for input(s): VITAMINB12, FOLATE, FERRITIN, TIBC, IRON, RETICCTPCT in the last 72 hours. Urine analysis:    Component Value Date/Time   COLORURINE YELLOW 09/23/2015 0941   APPEARANCEUR CLEAR 09/23/2015 0941   LABSPEC 1.044 (H) 09/23/2015 0941   PHURINE 6.0 09/23/2015 0941   GLUCOSEU 500 (A) 09/23/2015 0941   HGBUR TRACE (A) 09/23/2015 0941   BILIRUBINUR NEGATIVE 09/23/2015 0941   KETONESUR NEGATIVE 09/23/2015 0941   PROTEINUR 100 (A) 09/23/2015 0941   UROBILINOGEN 1.0 09/06/2014 1547   NITRITE NEGATIVE 09/23/2015 0941   LEUKOCYTESUR NEGATIVE 09/23/2015 0941     Radiology Studies: Ct Abdomen Pelvis W Contrast  Result Date: 09/23/2015 CLINICAL DATA:  70 year old female with abdominal pain nausea and vomiting since 1400 hours yesterday. Initial encounter. EXAM: CT ABDOMEN AND PELVIS WITH CONTRAST TECHNIQUE: Multidetector CT imaging of the abdomen and pelvis was performed using the standard protocol following bolus  administration of intravenous contrast. CONTRAST:  115mL ISOVUE-300 IOPAMIDOL (ISOVUE-300) INJECTION 61%, 5mL ISOVUE-300 IOPAMIDOL (ISOVUE-300) INJECTION 61% COMPARISON:  CT Abdomen and Pelvis 09/06/2014 and earlier. FINDINGS: Large body habitus. Lower chest: Motion artifact at the lung bases. Chronic elevated hemidiaphragms more so the right. Lung base atelectasis. Cardiomegaly. No pericardial or pleural effusion. Hepatobiliary: No abdominal free air or free fluid. Surgically absent gallbladder as before. Liver is stable and within normal limits. Pancreas: Negative. Spleen: Negative. Adrenals/Urinary Tract: Negative adrenal glands. Right renal midpole contour deformity with a small area of dystrophic appearing calcification is new since 2005 but appears similar to 2015. The appearance is such that a hypo enhancing 3 cm midpole mass is difficult to exclude (e.g. On series 2, image 38). But this might instead reflect renal cortical scarring. Otherwise  bilateral renal enhancement and contrast excretion is normal. Unremarkable urinary bladder.  Numerous pelvic phleboliths. Stomach/Bowel: Negative rectum with mild retained gas since tool. Mildly redundant but otherwise negative sigmoid colon. Motion artifact intermittently in the abdomen. Negative left colon. Negative transverse colon aside from retained stool and some redundancy. Negative right colon aside from retained stool. Appendix is diminutive or absent. Oral contrast has not yet reached the terminal ileum which appears normal. The small volume of pelvic free fluid is along the distal loop of small bowel which otherwise appears within normal limits. Oral contrast is located just upstream to this loop. No dilated small bowel. Decompressed stomach. Much of the duodenum is decompressed. Vascular/Lymphatic: Aortoiliac calcified atherosclerosis noted. Major arterial structures are patent. No abdominal lymphadenopathy. Reproductive: Surgically absent uterus as before.  Diminutive or absent adnexa. Other: Small volume of pelvic free fluid (series 2, image 86). Musculoskeletal: Degenerative changes throughout the spine. No acute osseous abnormality identified. IMPRESSION: 1. Small volume of abnormal but nonspecific free fluid in the pelvis. This might be related to a mild enteritis. No focal bowel inflammation is identified. No bowel obstruction. 2. Subtle 3 cm abnormal area in the right renal midpole. This might represent chronic scarring but hypo enhancing right renal mass is difficult to exclude. Recommend further characterization with routine outpatient Abdomen MRI without and with contrast (renal mass protocol). 3.  Calcified aortic atherosclerosis.  Cardiomegaly. 4. Mild lung base atelectasis. Electronically Signed   By: Genevie Ann M.D.   On: 09/23/2015 11:57      Scheduled Meds: . amLODipine  5 mg Oral QHS  . aspirin EC  81 mg Oral Daily  . atorvastatin  40 mg Oral QHS  . [START ON 09/28/2015] cloNIDine  0.1 mg Transdermal Q Sun  . doxazosin  4 mg Oral QHS  . enoxaparin (LOVENOX) injection  60 mg Subcutaneous Q24H  . hydrochlorothiazide  25 mg Oral BID  . insulin aspart  0-15 Units Subcutaneous TID WC  . insulin aspart  0-5 Units Subcutaneous QHS  . insulin glargine  70 Units Subcutaneous QHS  . irbesartan  75 mg Oral Daily  . levothyroxine  200 mcg Oral QAC breakfast  . metoprolol succinate  50 mg Oral Daily  . pantoprazole  20 mg Oral BID  . [START ON 09/25/2015] Vitamin D (Ergocalciferol)  50,000 Units Oral Q7 days   Continuous Infusions:    LOS: 0 days    Time spent: 20 minutes    Faye Ramsay, MD [facility] Pager [phone]  If 7PM-7AM, please contact night-coverage [email or web address] Password [email or web address] 09/24/2015, 11:14 AM

## 2015-09-24 NOTE — Care Management Obs Status (Signed)
[facility] NOTIFICATION   Patient Details  Name: SUNG GAVAGHAN MRN: [id number] Date of Birth: [birth year]   Medicare Observation Status Notification Given:  Yes    MahabirJuliann Pulse, RN 09/24/2015, 1:19 PM

## 2015-09-25 DIAGNOSIS — N183 Chronic kidney disease, stage 3 (moderate): Secondary | ICD-10-CM | POA: Diagnosis not present

## 2015-09-25 DIAGNOSIS — R7989 Other specified abnormal findings of blood chemistry: Secondary | ICD-10-CM | POA: Diagnosis not present

## 2015-09-25 DIAGNOSIS — R112 Nausea with vomiting, unspecified: Secondary | ICD-10-CM | POA: Diagnosis not present

## 2015-09-25 DIAGNOSIS — E1122 Type 2 diabetes mellitus with diabetic chronic kidney disease: Secondary | ICD-10-CM | POA: Diagnosis not present

## 2015-09-25 DIAGNOSIS — G43A1 Cyclical vomiting, intractable: Secondary | ICD-10-CM | POA: Diagnosis not present

## 2015-09-25 DIAGNOSIS — I129 Hypertensive chronic kidney disease with stage 1 through stage 4 chronic kidney disease, or unspecified chronic kidney disease: Secondary | ICD-10-CM | POA: Diagnosis not present

## 2015-09-25 DIAGNOSIS — E876 Hypokalemia: Secondary | ICD-10-CM | POA: Diagnosis not present

## 2015-09-25 LAB — BASIC METABOLIC PANEL
ANION GAP: 7 (ref 5–15)
BUN: 30 mg/dL — AB (ref 6–20)
CHLORIDE: 101 mmol/L (ref 101–111)
CO2: 32 mmol/L (ref 22–32)
Calcium: 9.2 mg/dL (ref 8.9–10.3)
Creatinine, Ser: 1.38 mg/dL — ABNORMAL HIGH (ref 0.44–1.00)
GFR calc Af Amer: 44 mL/min — ABNORMAL LOW (ref >60)
GFR, EST NON AFRICAN AMERICAN: 38 mL/min — AB (ref >60)
GLUCOSE: 118 mg/dL — AB (ref 65–99)
POTASSIUM: 3.7 mmol/L (ref 3.5–5.1)
Sodium: 140 mmol/L (ref 135–145)

## 2015-09-25 LAB — CBC
HCT: 37.5 % (ref 36.0–46.0)
HEMOGLOBIN: 11.9 g/dL — AB (ref 12.0–15.0)
MCH: 28.9 pg (ref 26.0–34.0)
MCHC: 31.7 g/dL (ref 30.0–36.0)
MCV: 91 fL (ref 78.0–100.0)
PLATELETS: 162 10*3/uL (ref 150–400)
RBC: 4.12 MIL/uL (ref 3.87–5.11)
RDW: 15.1 % (ref 11.5–15.5)
WBC: 6.2 10*3/uL (ref 4.0–10.5)

## 2015-09-25 LAB — GLUCOSE, CAPILLARY
GLUCOSE-CAPILLARY: 118 mg/dL — AB (ref 65–99)
GLUCOSE-CAPILLARY: 160 mg/dL — AB (ref 65–99)
Glucose-Capillary: 135 mg/dL — ABNORMAL HIGH (ref 65–99)

## 2015-09-25 MED ORDER — HYDROCOD POLST-CPM POLST ER 10-8 MG/5ML PO SUER
5.0000 mL | Freq: Two times a day (BID) | ORAL | Status: DC | PRN
Start: 2015-09-25 — End: 2015-09-25

## 2015-09-25 MED ORDER — HYDROCOD POLST-CPM POLST ER 10-8 MG/5ML PO SUER: 5.0000 mL | Freq: Two times a day (BID) | ORAL | 0 refills | Status: DC | PRN

## 2015-09-25 MED ORDER — OXYCODONE-ACETAMINOPHEN 5-325 MG PO TABS: 0.5000 | ORAL_TABLET | Freq: Four times a day (QID) | ORAL | 0 refills | Status: DC | PRN

## 2015-09-25 MED ORDER — KETOROLAC TROMETHAMINE 30 MG/ML IJ SOLN: 30.0000 mg | Freq: Once | INTRAMUSCULAR | Status: DC

## 2015-09-25 NOTE — Discharge Instructions (Signed)

## 2015-09-25 NOTE — Progress Notes (Signed)
Patients new Prescription for Tussionex printed to the nurses station. AVS reprinted and Dr. Doyle Askew paged to notify her that the prescription was at the nurses station waiting to be signed. Dr. Doyle Askew called back and said that the she had called it in and that it did not need to be signed that it should be waiting at the [facility] for her. Printed prescription given to patient.

## 2015-09-25 NOTE — Care Management Note (Signed)
Case Management Note  Patient Details  Name: [name] MRN: [id number] Date of Birth: November 29, 1945  Subjective/Objective:                    Action/Plan:d/c home.No needs or orders.   Expected Discharge Date:   (unknown)               Expected Discharge Plan:  Home/Self Care  In-House Referral:     Discharge planning Services     Post Acute Care Choice:  Resumption of Svcs/PTA Provider (private duty care-custodial level aides-8hrs/week) Choice offered to:     DME Arranged:    DME Agency:     HH Arranged:    [place] Agency:     Status of Service:  Completed, signed off  If discussed at [employer] of Stay Meetings, dates discussed:    Additional Comments:  Dessa Phi, RN 09/25/2015, 12:12 PM
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
## 2015-10-02 ENCOUNTER — Other Ambulatory Visit: Payer: Self-pay | Admitting: Internal Medicine

## 2015-10-02 DIAGNOSIS — N289 Disorder of kidney and ureter, unspecified: Secondary | ICD-10-CM

## 2015-10-16 ENCOUNTER — Inpatient Hospital Stay: Admission: RE | Admit: 2015-10-16 | Payer: Medicare Other | Source: Ambulatory Visit

## 2015-10-31 ENCOUNTER — Other Ambulatory Visit: Payer: Medicare Other

## 2015-11-04 ENCOUNTER — Ambulatory Visit
Admission: RE | Admit: 2015-11-04 | Discharge: 2015-11-04 | Disposition: A | Discharge status: Home / Self Care | Payer: Medicare Other | Source: Ambulatory Visit | Attending: Internal Medicine | Admitting: Internal Medicine

## 2015-11-04 DIAGNOSIS — N289 Disorder of kidney and ureter, unspecified: Secondary | ICD-10-CM

## 2015-11-04 MED ORDER — GADOBENATE DIMEGLUMINE 529 MG/ML IV SOLN
20.0000 mL | Freq: Once | INTRAVENOUS | Status: AC | PRN
Start: 2015-11-04 — End: 2015-11-04
  Administered 2015-11-04: 20 mL via INTRAVENOUS

## 2015-11-12 ENCOUNTER — Encounter: Payer: Self-pay | Admitting: Podiatry

## 2015-11-12 ENCOUNTER — Ambulatory Visit (INDEPENDENT_AMBULATORY_CARE_PROVIDER_SITE_OTHER): Payer: Medicare Other | Admitting: Podiatry

## 2015-11-12 DIAGNOSIS — B351 Tinea unguium: Secondary | ICD-10-CM

## 2015-11-12 DIAGNOSIS — M79676 Pain in unspecified toe(s): Secondary | ICD-10-CM

## 2015-11-12 NOTE — Patient Instructions (Signed)
Diabetes and Foot Care Diabetes may cause you to have problems because of poor blood supply (circulation) to your feet and legs. This may cause the skin on your feet to become thinner, break easier, and heal more slowly. Your skin may become dry, and the skin may peel and crack. You may also have nerve damage in your legs and feet causing decreased feeling in them. You may not notice minor injuries to your feet that could lead to infections or more serious problems. Taking care of your feet is one of the most important things you can do for yourself.  HOME CARE INSTRUCTIONS  Wear shoes at all times, even in the house. Do not go barefoot. Bare feet are easily injured.  Check your feet daily for blisters, cuts, and redness. If you cannot see the bottom of your feet, use a mirror or ask someone for help.  Wash your feet with warm water (do not use hot water) and mild soap. Then pat your feet and the areas between your toes until they are completely dry. Do not soak your feet as this can dry your skin.  Apply a moisturizing lotion or petroleum jelly (that does not contain alcohol and is unscented) to the skin on your feet and to dry, brittle toenails. Do not apply lotion between your toes.  Trim your toenails straight across. Do not dig under them or around the cuticle. File the edges of your nails with an emery board or nail file.  Do not cut corns or calluses or try to remove them with medicine.  Wear clean socks or stockings every day. Make sure they are not too tight. Do not wear knee-high stockings since they may decrease blood flow to your legs.  Wear shoes that fit properly and have enough cushioning. To break in new shoes, wear them for just a few hours a day. This prevents you from injuring your feet. Always look in your shoes before you put them on to be sure there are no objects inside.  Do not cross your legs. This may decrease the blood flow to your feet.  If you find a minor scrape,  cut, or break in the skin on your feet, keep it and the skin around it clean and dry. These areas may be cleansed with mild soap and water. Do not cleanse the area with peroxide, alcohol, or iodine.  When you remove an adhesive bandage, be sure not to damage the skin around it.  If you have a wound, look at it several times a day to make sure it is healing.  Do not use heating pads or hot water bottles. They may burn your skin. If you have lost feeling in your feet or legs, you may not know it is happening until it is too late.  Make sure your health care provider performs a complete foot exam at least annually or more often if you have foot problems. Report any cuts, sores, or bruises to your health care provider immediately. SEEK MEDICAL CARE IF:   You have an injury that is not healing.  You have cuts or breaks in the skin.  You have an ingrown nail.  You notice redness on your legs or feet.  You feel burning or tingling in your legs or feet.  You have pain or cramps in your legs and feet.  Your legs or feet are numb.  Your feet always feel cold. SEEK IMMEDIATE MEDICAL CARE IF:   There is increasing redness,   swelling, or pain in or around a wound.  There is a red line that goes up your leg.  Pus is coming from a wound.  You develop a fever or as directed by your health care provider.  You notice a bad smell coming from an ulcer or wound.   This information is not intended to replace advice given to you by your health care provider. Make sure you discuss any questions you have with your health care provider.   Document Released: 12/26/1999 Document Revised: 08/30/2012 Document Reviewed: 06/06/2012 Elsevier Interactive Patient Education 2016 [employer]  

## 2015-11-13 NOTE — Progress Notes (Signed)
Patient ID: [name], female   DOB: 04/26/1945, 70 y.o.   MRN: [id number]    Subjective: This patient presents for a scheduled visit complaining of elongated and thickened toenails which are uncomfortable walking wearing shoes and request toenail debridement  Objective: Orientated 3 DP and PT pulses 1/4 bilaterally Capillary reflex immediate bilaterally Sensation to 10 g monofilament wire intact 4/5 bilaterally Vibratory sensation reactive bilaterally Ankle reflex equal reactive bilaterally Hammertoe first bilaterally No open skin lesions bilaterally The toenails are elongated, hypertrophic, discolored, deformed, and tender direct palpation 6-10  Assessment: Satisfactory neurovascular status Diabetic on insulin Symptomatic onychomycoses 6-10  Plan: Debridement of toenails 6-10 mechanically and lelectrically without any bleeding  Reappoint 3 months

## 2015-12-24 ENCOUNTER — Other Ambulatory Visit: Payer: Self-pay | Admitting: Urology

## 2015-12-24 DIAGNOSIS — N2889 Other specified disorders of kidney and ureter: Secondary | ICD-10-CM

## 2016-01-14 ENCOUNTER — Ambulatory Visit
Admission: RE | Admit: 2016-01-14 | Discharge: 2016-01-14 | Disposition: A | Discharge status: Home / Self Care | Payer: Medicare Other | Source: Ambulatory Visit | Attending: Urology | Admitting: Urology

## 2016-01-14 DIAGNOSIS — N2889 Other specified disorders of kidney and ureter: Secondary | ICD-10-CM

## 2016-01-14 HISTORY — PX: IR GENERIC HISTORICAL: IMG1180011

## 2016-01-14 NOTE — Consult Note (Signed)
Chief Complaint  Patient presents with  . Advice Only    Consult for Right Renal Mass   at the request of [place]  Referring Physician(s): Ottelin,Mark  History of Present Illness: [name] is a 71 y.o. female with past medical history significant for anxiety, asthma, diabetes, hypertension, hyperlipidemia, sleep apnea and morbid obesity was found to have a indeterminate renal lesion on abdominal CT performed 09/23/2015 for the workup of diarrhea. Subsequent abdominal MRI performed 11/04/2015 demonstrated a bilobed enhancing right interpolar lesion worrisome for renal cell carcinoma. Patient was seen in consultation by Dr. Karsten Ro (Urology), however does not wish to undergo surgical resection, and as such has been referred to interventional radiology for evaluation of candidacy for renal cryoablation. The patient is unaccompanied though serves as her own historian.  The patient is without complaint in regards to her right-sided renal lesion. Specifically, no flank pain. No hematuria. No fever or chills. No unintentional weight loss or weight gain.  Past Medical History:  Diagnosis Date  . Anxiety   . Arthritis   . Asthma   . Diabetes mellitus   . GERD (gastroesophageal reflux disease)   . Hyperlipidemia   . Hypertension   . Hypothyroid   . Sleep apnea    use CPAP nightly    Past Surgical History:  Procedure Laterality Date  . ABDOMINAL HYSTERECTOMY    . CHOLECYSTECTOMY    . COLONOSCOPY WITH PROPOFOL N/A 08/29/2012   Procedure: COLONOSCOPY WITH PROPOFOL;  Surgeon: Garlan Fair, MD;  Location: [facility];  Service: Endoscopy;  Laterality: N/A;  . EYE SURGERY      Allergies: Esomeprazole magnesium; Augmentin [amoxicillin-pot clavulanate]; Avelox [moxifloxacin hcl in nacl]; Demerol [meperidine]; Keflex [cephalexin]; Levofloxacin; and Lisinopril  Medications: Prior to Admission medications   Medication Sig Start Date End Date Taking? Authorizing Provider    albuterol (PROVENTIL HFA;VENTOLIN HFA) 108 (90 BASE) MCG/ACT inhaler Inhale 2 puffs into the lungs every 6 (six) hours as needed for wheezing.   Yes Historical Provider, MD  ALPRAZolam (XANAX) 0.25 MG tablet Take 0.125-0.25 mg by mouth 3 (three) times daily as needed for anxiety.    Yes Historical Provider, MD  amLODipine (NORVASC) 5 MG tablet Take 5 mg by mouth at bedtime.   Yes Historical Provider, MD  aspirin EC 81 MG tablet Take 81 mg by mouth daily.   Yes Historical Provider, MD  atorvastatin (LIPITOR) 40 MG tablet Take 40 mg by mouth at bedtime.    Yes Historical Provider, MD  candesartan (ATACAND) 32 MG tablet Take 32 mg by mouth at bedtime.   Yes Historical Provider, MD  chlorpheniramine-HYDROcodone (TUSSIONEX) 10-8 MG/5ML SUER Take 5 mLs by mouth every 12 (twelve) hours as needed for cough. 09/25/15  Yes Theodis Blaze, MD  cloNIDine (CATAPRES - DOSED IN MG/24 HR) 0.1 mg/24hr patch Place 1 patch onto the skin every Sunday. On Sundays   Yes Historical Provider, MD  doxazosin (CARDURA) 8 MG tablet Take 4 mg by mouth at bedtime.    Yes Historical Provider, MD  hydrochlorothiazide (HYDRODIURIL) 25 MG tablet Take 25 mg by mouth 2 (two) times daily.    Yes Historical Provider, MD  insulin glargine (LANTUS) 100 UNIT/ML injection Inject 85 Units into the skin at bedtime.    Yes Historical Provider, MD  insulin lispro (HUMALOG) 100 UNIT/ML injection Inject 4-16 Units into the skin 3 (three) times daily before meals. Sliding scale, she decides how much to give depending on how much she eats  Yes Historical Provider, MD  levothyroxine (SYNTHROID, LEVOTHROID) 200 MCG tablet Take 200 mcg by mouth daily before breakfast.    Yes Historical Provider, MD  metoprolol (TOPROL-XL) 50 MG 24 hr tablet Take 50 mg by mouth daily.    Yes Historical Provider, MD  oxyCODONE-acetaminophen (PERCOCET) 5-325 MG tablet Take 0.5 tablets by mouth every 6 (six) hours as needed for severe pain. 09/25/15  Yes Theodis Blaze, MD   promethazine (PHENERGAN) 25 MG suppository Place 25 mg rectally every 6 (six) hours as needed for nausea or vomiting.   Yes Historical Provider, MD  Vitamin D, Ergocalciferol, (DRISDOL) 50000 UNITS CAPS Take 50,000 Units by mouth every 7 (seven) days. Thursdays   Yes Historical Provider, MD  cyclobenzaprine (FLEXERIL) 5 MG tablet Take 1 tablet (5 mg total) by mouth 2 (two) times daily as needed for muscle spasms. Patient not taking: Reported on 01/14/2016 09/17/15   Margarita Mail, PA-C  pantoprazole (PROTONIX) 20 MG tablet Take 1 tablet (20 mg total) by mouth 2 (two) times daily. Patient not taking: Reported on 01/14/2016 07/17/14   Virgel Manifold, MD     Family History  Problem Relation Age of Onset  . Adopted: Yes    Social History   Social History  . Marital status: Married    Spouse name: N/A  . Number of children: N/A  . Years of education: N/A   Social History Main Topics  . Smoking status: Never Smoker  . Smokeless tobacco: Never Used  . Alcohol use No  . Drug use: No  . Sexual activity: Not Currently   Other Topics Concern  . Not on file   Social History Narrative  . No narrative on file    ECOG Status: 0 - Asymptomatic  Review of Systems: A 12 point ROS discussed and pertinent positives are indicated in the HPI above.  All other systems are negative.  Review of Systems  Constitutional: Negative for activity change, appetite change, fever and unexpected weight change.  Respiratory: Negative.   Cardiovascular: Negative.   Gastrointestinal: Negative.   Genitourinary: Negative for difficulty urinating, flank pain, frequency and hematuria.    Vital Signs: BP 131/69 (BP Location: Left Arm, Patient Position: Sitting, Cuff Size: Large)   Pulse 75   Temp 98.1 F (36.7 C) (Oral)   Resp 15   Ht 5\' 8"  (1.727 m)   Wt (!) 319 lb (144.7 kg)   SpO2 96%   BMI 48.50 kg/m   Physical Exam  Constitutional: She appears well-developed and well-nourished.  HENT:  Head:  Normocephalic and atraumatic.  Cardiovascular: Normal rate and regular rhythm.   Pulmonary/Chest: Effort normal and breath sounds normal.  Abdominal: Soft. She exhibits no mass. There is no guarding.  Psychiatric: She has a normal mood and affect. Her behavior is normal.  Appears anxious    Imaging:  CT abdomen and pelvis - 09/23/2015 Abdominal MRI - 11/04/2015  Labs:  CBC:  Recent Labs  09/23/15 1019 09/24/15 0114 09/25/15 0502  WBC 9.3 7.9 6.2  HGB 14.4 12.5 11.9*  HCT 42.2 38.1 37.5  PLT 185 177 162    COAGS: No results for input(s): INR, APTT in the last 8760 hours.  BMP:  Recent Labs  09/23/15 1019 09/24/15 0114 09/24/15 1550 09/25/15 0502  NA 137 140 137 140  K 3.1* 3.1* 3.4* 3.7  CL 94* 99* 97* 101  CO2 32 34* 33* 32  GLUCOSE 235* 123* 160* 118*  BUN 17 26* 34* 30*  CALCIUM  9.5 9.1 9.2 9.2  CREATININE 1.04* 1.23* 1.53* 1.38*  GFRNONAA 54* 44* 34* 38*  GFRAA >60 51* 39* 44*    LIVER FUNCTION TESTS:  Recent Labs  09/23/15 1019  BILITOT 1.0  AST 19  ALT 17  ALKPHOS 71  PROT 8.3*  ALBUMIN 3.9    Assessment and Plan:  [name] is a 71 y.o. female with past medical history significant for anxiety, asthma, diabetes, hypertension, hyperlipidemia, sleep apnea and morbid obesity was found to have an indeterminate renal lesion on abdominal CT performed 09/23/2015 with subsequent abdominal MRI performed 11/04/2015 demonstrated a bilobed enhancing right interpolar lesion worrisome for renal cell carcinoma.   Review of abdominal MRI performed 11/04/2015 demonstrates a bilobed at least 4.1 x 3.2 cm mass involving the interpolar aspect of the right kidney worrisome for renal cell carcinoma.  I explained to the patient that given the size of the lesion (greater than 3 cm), cryoablation is proven to be less effective than surgical resection with higher likelihood of residual disease however the patient is adamant that she does not want to undergo  surgery given her concern for worsening renal function given her diabetes.  Despite my offering referral to a nephrologist to evaluate if she could tolerate a nephrectomy, the patient is adamant that she does not want to undergo surgical resection.  As such prolonged conversations were held with the patient regarding the risks and benefits discussed of renal cryoablation including not limited to failure to treat entire lesion, bleeding, infection, damage to adjacent structures, decrease in renal function or post procedural neuropathy.   I explained the procedure will be technically difficult secondary to lesion size, location and visualization due to patient body habitus, with increased risk of residual disease and/or injury to the collecting system.  All of the patient's questions were answered and the patient is agreeable to proceed.  As most recent imaging was performed on the 11/04/2015, I will obtain a renal protocol CT scan both to evaluate stability of the lesion as well as for procedural planning purposes.  Cryoablation will be performed at [facility] at the next general anesthesia available slot. The procedure will entail an overnight admission for PCA usage and continued observation.  The patient was encouraged to call the interventional radiology clinic with any interval questions or concerns.   Thank you for this interesting consult.  I greatly enjoyed meeting [name] and look forward to participating in their care.  A copy of this report was sent to the requesting provider on this date.  Electronically Signed: Sandi Mariscal 01/14/2016, 3:10 PM   I spent a total of 40 Minutes in face to face in clinical consultation, greater than 50% of which was counseling/coordinating care for Right sided renal lesion

## 2016-01-15 ENCOUNTER — Other Ambulatory Visit (HOSPITAL_COMMUNITY): Payer: Self-pay | Admitting: Interventional Radiology

## 2016-01-15 DIAGNOSIS — N2889 Other specified disorders of kidney and ureter: Secondary | ICD-10-CM

## 2016-01-16 ENCOUNTER — Encounter: Payer: Self-pay | Admitting: Urology

## 2016-01-16 ENCOUNTER — Other Ambulatory Visit: Payer: Self-pay | Admitting: *Deleted

## 2016-01-16 DIAGNOSIS — N2889 Other specified disorders of kidney and ureter: Secondary | ICD-10-CM

## 2016-01-20 ENCOUNTER — Ambulatory Visit (HOSPITAL_COMMUNITY)
Admission: RE | Admit: 2016-01-20 | Discharge: 2016-01-20 | Disposition: A | Discharge status: Home / Self Care | Payer: Medicare Other | Source: Ambulatory Visit | Attending: Interventional Radiology | Admitting: Interventional Radiology

## 2016-01-20 ENCOUNTER — Ambulatory Visit (HOSPITAL_COMMUNITY): Payer: Medicare Other

## 2016-01-20 DIAGNOSIS — N2889 Other specified disorders of kidney and ureter: Secondary | ICD-10-CM

## 2016-01-20 LAB — POCT I-STAT CREATININE: Creatinine, Ser: 1.1 mg/dL — ABNORMAL HIGH (ref 0.44–1.00)

## 2016-01-20 MED ORDER — IOPAMIDOL (ISOVUE-300) INJECTION 61%
INTRAVENOUS | Status: AC
  Filled 2016-01-20: qty 100

## 2016-01-20 MED ORDER — IOPAMIDOL (ISOVUE-300) INJECTION 61%
100.0000 mL | Freq: Once | INTRAVENOUS | Status: AC | PRN
  Administered 2016-01-20: 100 mL via INTRAVENOUS

## 2016-01-29 ENCOUNTER — Telehealth: Payer: Self-pay | Admitting: Interventional Radiology

## 2016-01-29 NOTE — Telephone Encounter (Signed)
Discussed results of recent CT scan obtained on 01/20/16 with the patient.  Fortunately the right mid pole lesion appears grossly unchanged in size and overall appearance since the October MRI, however again the lesion measures nearly 4 cm, abuts the renal collecting system and it's borders are very ill-defined, even after the administration of IV contrast.  I explained again to the patient that this lesion is worrisome for RCC.  I again explained to the patient that given the size, location and overall appearance, I am concerned that if a cyroablation was performed, it would be difficult to obtain clear margins.  As such, I suggested the patient re-consider undergoing a nephrectomy for definitive treatment.  I also explained to the patient that even performing a renal lesion biopsy is difficult and that would only be performed if deemed appropriate prior to nephrectomy.  Given the pt's concern that she would have to be on dialysis if she were to undergo a nephrectomy, I have offered the patient a referral to nephrology to evaluate her risk -> she will consider this option and call our clinic with the name of the nephrologist who her sister sees.  Attempted to discuss this difficult case with Dr. Karsten Ro but he is unavailable today (likely due to the inclement weather).    Given lack of substantial growth, we have some time to formalize a plan to handle this incidental though worrisome lesion, though I would like to formulate a definite plan in the coming weeks.  Ronny Bacon, MD Pager #: [phone]

## 2016-02-24 ENCOUNTER — Encounter: Payer: Self-pay | Admitting: Podiatry

## 2016-02-24 ENCOUNTER — Ambulatory Visit (INDEPENDENT_AMBULATORY_CARE_PROVIDER_SITE_OTHER): Payer: Medicare Other | Admitting: Podiatry

## 2016-02-24 VITALS — BP 162/94 | HR 58

## 2016-02-24 DIAGNOSIS — M79676 Pain in unspecified toe(s): Secondary | ICD-10-CM

## 2016-02-24 DIAGNOSIS — B351 Tinea unguium: Secondary | ICD-10-CM | POA: Diagnosis not present

## 2016-02-24 DIAGNOSIS — E119 Type 2 diabetes mellitus without complications: Secondary | ICD-10-CM | POA: Diagnosis not present

## 2016-02-24 NOTE — Progress Notes (Signed)
Patient ID: [name], female   DOB: August 04, 1945, 71 y.o.   MRN: [id number]    Subjective: This patient presents for a scheduled visit complaining of elongated and thickened toenails which are uncomfortable walking wearing shoes and request toenail debridement. Patient describes kidney lesion with pending treatment at [facility] in March 2018  Objective: Orientated 3 DP and PT pulses 1/4 bilaterally Capillary reflex immediate bilaterally Sensation to 10 g monofilament wire intact 4/5 bilaterally Vibratory sensation reactive bilaterally Ankle reflex equal reactive bilaterally Hammertoe first bilaterally No open skin lesions bilaterally The toenails are elongated, hypertrophic, discolored, deformed, and tender direct palpation 6-10  Assessment: Satisfactory neurovascular status Diabetic on insulin Symptomatic onychomycoses 6-10  Plan: Debridement of toenails 6-10 mechanically and lelectrically without any bleeding  Reappoint 3 months

## 2016-02-24 NOTE — Patient Instructions (Signed)

## 2016-03-23 ENCOUNTER — Telehealth: Payer: Self-pay | Admitting: Radiology

## 2016-03-23 NOTE — Telephone Encounter (Signed)
[name] was seen in our office on 01/14/2016 for consult for possible cryoablation of Right renal mass.  She was contacted by phone on 2/8/208 to inquire about her decision regarding cryoablation. Patient stated that she would call back.  Attempts were made again in February to contact patient as she had not been in contact with our office.  She does have an appointment on 03/30/2016 for Right Renal Tumor Ablation at [facility] to be performed by Dr. Glynda Jaeger (see Care Everywhere).  Per Dr Pascal Lux, patient should have a new referral if she should decide/need to return to our office.    Carrie Reichl Riki Rusk, RN 03/23/2016 4:37 PM

## 2016-05-25 ENCOUNTER — Encounter: Payer: Self-pay | Admitting: Podiatry

## 2016-05-25 ENCOUNTER — Ambulatory Visit (INDEPENDENT_AMBULATORY_CARE_PROVIDER_SITE_OTHER): Payer: Medicare Other | Admitting: Podiatry

## 2016-05-25 VITALS — BP 134/68 | HR 71

## 2016-05-25 DIAGNOSIS — M79676 Pain in unspecified toe(s): Secondary | ICD-10-CM | POA: Diagnosis not present

## 2016-05-25 DIAGNOSIS — B351 Tinea unguium: Secondary | ICD-10-CM | POA: Diagnosis not present

## 2016-05-25 NOTE — Progress Notes (Signed)
Patient ID: [name], female   DOB: 04-13-1945, 71 y.o.   MRN: [id number]   Subjective: This patient presents for a scheduled visit complaining of elongated and thickened toenails which are uncomfortable walking wearing shoes and request toenail debridement. Patient describes kidney lesion with  treatment at [facility] in  2018  Objective: Orientated 3 DP and PT pulses 1/4 bilaterally Capillary reflex immediate bilaterally Sensation to 10 g monofilament wire intact 4/5 bilaterally Vibratory sensation reactive bilaterally Ankle reflex equal reactive bilaterally Hammertoe first bilaterally No open skin lesions bilaterally The toenails are elongated, hypertrophic, discolored, deformed, and tender direct palpation 6-10  Assessment: Satisfactory neurovascular status Diabetic on insulin Symptomatic onychomycoses 6-10  Plan: Debridement of toenails 6-10 mechanically and electrically with slight bleeding distal hallux bilaterally, treated with topical antibiotic ointment and Band-Aids. Patient instruction removed bandage 1-3 days and continue apply topical antibiotic ointment and Band-Aids daily until a scab forms.  Reappoint 3 months

## 2016-05-25 NOTE — Patient Instructions (Signed)
Removed bandage on right and left great toes 1-3 days and apply topical antibiotic ointment and Band-Aids daily until a scab forms  Diabetes and Foot Care Diabetes may cause you to have problems because of poor blood supply (circulation) to your feet and legs. This may cause the skin on your feet to become thinner, break easier, and heal more slowly. Your skin may become dry, and the skin may peel and crack. You may also have nerve damage in your legs and feet causing decreased feeling in them. You may not notice minor injuries to your feet that could lead to infections or more serious problems. Taking care of your feet is one of the most important things you can do for yourself. Follow these instructions at home:  Wear shoes at all times, even in the house. Do not go barefoot. Bare feet are easily injured.  Check your feet daily for blisters, cuts, and redness. If you cannot see the bottom of your feet, use a mirror or ask someone for help.  Wash your feet with warm water (do not use hot water) and mild soap. Then pat your feet and the areas between your toes until they are completely dry. Do not soak your feet as this can dry your skin.  Apply a moisturizing lotion or petroleum jelly (that does not contain alcohol and is unscented) to the skin on your feet and to dry, brittle toenails. Do not apply lotion between your toes.  Trim your toenails straight across. Do not dig under them or around the cuticle. File the edges of your nails with an emery board or nail file.  Do not cut corns or calluses or try to remove them with medicine.  Wear clean socks or stockings every day. Make sure they are not too tight. Do not wear knee-high stockings since they may decrease blood flow to your legs.  Wear shoes that fit properly and have enough cushioning. To break in new shoes, wear them for just a few hours a day. This prevents you from injuring your feet. Always look in your shoes before you put them on  to be sure there are no objects inside.  Do not cross your legs. This may decrease the blood flow to your feet.  If you find a minor scrape, cut, or break in the skin on your feet, keep it and the skin around it clean and dry. These areas may be cleansed with mild soap and water. Do not cleanse the area with peroxide, alcohol, or iodine.  When you remove an adhesive bandage, be sure not to damage the skin around it.  If you have a wound, look at it several times a day to make sure it is healing.  Do not use heating pads or hot water bottles. They may burn your skin. If you have lost feeling in your feet or legs, you may not know it is happening until it is too late.  Make sure your health care provider performs a complete foot exam at least annually or more often if you have foot problems. Report any cuts, sores, or bruises to your health care provider immediately. Contact a health care provider if:  You have an injury that is not healing.  You have cuts or breaks in the skin.  You have an ingrown nail.  You notice redness on your legs or feet.  You feel burning or tingling in your legs or feet.  You have pain or cramps in your legs and  feet.  Your legs or feet are numb.  Your feet always feel cold. Get help right away if:  There is increasing redness, swelling, or pain in or around a wound.  There is a red line that goes up your leg.  Pus is coming from a wound.  You develop a fever or as directed by your health care provider.  You notice a bad smell coming from an ulcer or wound. This information is not intended to replace advice given to you by your health care provider. Make sure you discuss any questions you have with your health care provider. Document Released: 12/26/1999 Document Revised: 06/05/2015 Document Reviewed: 06/06/2012 Elsevier Interactive Patient Education  2017 [employer].

## 2016-08-25 ENCOUNTER — Encounter: Payer: Self-pay | Admitting: Podiatry

## 2016-08-25 ENCOUNTER — Ambulatory Visit (INDEPENDENT_AMBULATORY_CARE_PROVIDER_SITE_OTHER): Payer: Medicare Other | Admitting: Podiatry

## 2016-08-25 DIAGNOSIS — B351 Tinea unguium: Secondary | ICD-10-CM | POA: Diagnosis not present

## 2016-08-25 DIAGNOSIS — M79676 Pain in unspecified toe(s): Secondary | ICD-10-CM | POA: Diagnosis not present

## 2016-08-25 DIAGNOSIS — E119 Type 2 diabetes mellitus without complications: Secondary | ICD-10-CM | POA: Diagnosis not present

## 2016-08-25 NOTE — Patient Instructions (Signed)

## 2016-08-25 NOTE — Progress Notes (Signed)
Patient ID: [name], female   DOB: April 07, 1945, 71 y.o.   MRN: [id number]    Subjective: This patient presents for a scheduled visit complaining of elongated and thickened toenails which are uncomfortable walking wearing shoes and request toenail debridement.Patient describes kidney lesion with  treatment at [facility] in  2018  Objective: Orientated 3 DP and PT pulses 1/4 bilaterally Capillary reflex immediate bilaterally Sensation to 10 g monofilament wire intact 4/5 bilaterally Vibratory sensation reactive bilaterally Ankle reflex equal reactive bilaterally Hammertoe first bilaterally No open skin lesions bilaterally The toenails are elongated, hypertrophic, discolored, deformed, and tender direct palpation 6-10  Assessment: Satisfactory neurovascular status Diabetic on insulin Symptomatic onychomycoses 6-10  Plan: Debridement of toenails 6-10 mechanically and electrically without any bleeding  Reappoint 3 months

## 2016-11-30 ENCOUNTER — Ambulatory Visit: Payer: Medicare Other | Admitting: Podiatry

## 2016-12-13 ENCOUNTER — Ambulatory Visit (INDEPENDENT_AMBULATORY_CARE_PROVIDER_SITE_OTHER): Payer: Medicare Other | Admitting: Podiatry

## 2016-12-13 ENCOUNTER — Encounter: Payer: Self-pay | Admitting: Podiatry

## 2016-12-13 DIAGNOSIS — B351 Tinea unguium: Secondary | ICD-10-CM

## 2016-12-13 DIAGNOSIS — M79674 Pain in right toe(s): Secondary | ICD-10-CM

## 2016-12-13 DIAGNOSIS — M79675 Pain in left toe(s): Secondary | ICD-10-CM | POA: Diagnosis not present

## 2016-12-13 DIAGNOSIS — E119 Type 2 diabetes mellitus without complications: Secondary | ICD-10-CM

## 2016-12-13 NOTE — Progress Notes (Signed)
Patient ID: [name], female   DOB: [birth year], 71 y.o.   MRN: [id number]     Subjective: This patient presents for a scheduled visit complaining of elongated and thickened toenails which are uncomfortable walking wearing shoes and request toenail debridement.Patient describes kidney lesion with treatment at [facility] in 2018  Objective: Orientated 3 DP and PT pulses 1/4 bilaterally Capillary reflex immediate bilaterally Sensation to 10 g monofilament wire intact 4/5 bilaterally Vibratory sensation reactive bilaterally Ankle reflex equal reactive bilaterally Hammertoe first bilaterally No open skin lesions bilaterally The toenails are elongated, hypertrophic, discolored, deformed, and tender direct palpation 6-10  Assessment: Satisfactory neurovascular status Diabetic on insulin Symptomatic onychomycoses 6-10  Plan: Debridement of toenails 6-10 mechanically and electrically without any bleeding  Reappoint 3 months

## 2016-12-13 NOTE — Patient Instructions (Signed)

## 2017-03-14 ENCOUNTER — Ambulatory Visit: Payer: Medicare Other | Admitting: Podiatry

## 2017-03-22 ENCOUNTER — Encounter: Payer: Self-pay | Admitting: Podiatry

## 2017-03-22 ENCOUNTER — Ambulatory Visit (INDEPENDENT_AMBULATORY_CARE_PROVIDER_SITE_OTHER): Payer: Medicare Other | Admitting: Podiatry

## 2017-03-22 DIAGNOSIS — M79675 Pain in left toe(s): Secondary | ICD-10-CM | POA: Diagnosis not present

## 2017-03-22 DIAGNOSIS — B351 Tinea unguium: Secondary | ICD-10-CM

## 2017-03-22 DIAGNOSIS — M79674 Pain in right toe(s): Secondary | ICD-10-CM

## 2017-03-22 NOTE — Progress Notes (Signed)
Complaint:  Visit Type: Patient returns to my office for continued preventative foot care services. Complaint: Patient states" my nails have grown long and thick and become painful to walk and wear shoes" Patient has been diagnosed with DM with no foot complications. The patient presents for preventative foot care services. No changes to ROS  Podiatric Exam: Vascular: dorsalis pedis and posterior tibial pulses are palpable bilateral. Capillary return is immediate. Temperature gradient is WNL. Skin turgor WNL  Sensorium: Normal Semmes Weinstein monofilament test. Normal tactile sensation bilaterally. Nail Exam: Pt has thick disfigured discolored nails with subungual debris noted bilateral entire nail hallux through fifth toenails Ulcer Exam: There is no evidence of ulcer or pre-ulcerative changes or infection. Orthopedic Exam: Muscle tone and strength are WNL. No limitations in general ROM. No crepitus or effusions noted. Foot type and digits show no abnormalities. Hallux malleus. Skin: No Porokeratosis. No infection or ulcers  Diagnosis:  Onychomycosis, , Pain in right toe, pain in left toes  Treatment & Plan Procedures and Treatment: Consent by patient was obtained for treatment procedures.   Debridement of mycotic and hypertrophic toenails, 1 through 5 bilateral and clearing of subungual debris. No ulceration, no infection noted.  Return Visit-Office Procedure: Patient instructed to return to the office for a follow up visit 3 months for continued evaluation and treatment.    Elianny Buxbaum DPM 

## 2017-05-02 ENCOUNTER — Encounter (HOSPITAL_COMMUNITY): Payer: Self-pay | Admitting: Emergency Medicine

## 2017-05-02 ENCOUNTER — Emergency Department (HOSPITAL_COMMUNITY)
Admission: EM | Admit: 2017-05-02 | Discharge: 2017-05-02 | Disposition: A | Discharge status: Home / Self Care | Payer: Medicare Other | Attending: Emergency Medicine | Admitting: Emergency Medicine

## 2017-05-02 ENCOUNTER — Emergency Department (HOSPITAL_COMMUNITY): Payer: Medicare Other

## 2017-05-02 DIAGNOSIS — I129 Hypertensive chronic kidney disease with stage 1 through stage 4 chronic kidney disease, or unspecified chronic kidney disease: Secondary | ICD-10-CM | POA: Diagnosis not present

## 2017-05-02 DIAGNOSIS — R05 Cough: Secondary | ICD-10-CM | POA: Diagnosis present

## 2017-05-02 DIAGNOSIS — Z7982 Long term (current) use of aspirin: Secondary | ICD-10-CM | POA: Diagnosis not present

## 2017-05-02 DIAGNOSIS — E039 Hypothyroidism, unspecified: Secondary | ICD-10-CM | POA: Diagnosis not present

## 2017-05-02 DIAGNOSIS — J181 Lobar pneumonia, unspecified organism: Secondary | ICD-10-CM | POA: Insufficient documentation

## 2017-05-02 DIAGNOSIS — Z794 Long term (current) use of insulin: Secondary | ICD-10-CM | POA: Diagnosis not present

## 2017-05-02 DIAGNOSIS — E1122 Type 2 diabetes mellitus with diabetic chronic kidney disease: Secondary | ICD-10-CM | POA: Insufficient documentation

## 2017-05-02 DIAGNOSIS — Z79899 Other long term (current) drug therapy: Secondary | ICD-10-CM | POA: Diagnosis not present

## 2017-05-02 DIAGNOSIS — J189 Pneumonia, unspecified organism: Secondary | ICD-10-CM

## 2017-05-02 DIAGNOSIS — N183 Chronic kidney disease, stage 3 (moderate): Secondary | ICD-10-CM | POA: Diagnosis not present

## 2017-05-02 DIAGNOSIS — J45909 Unspecified asthma, uncomplicated: Secondary | ICD-10-CM | POA: Diagnosis not present

## 2017-05-02 LAB — BASIC METABOLIC PANEL
ANION GAP: 10 (ref 5–15)
BUN: 19 mg/dL (ref 6–20)
CHLORIDE: 100 mmol/L — AB (ref 101–111)
CO2: 34 mmol/L — AB (ref 22–32)
Calcium: 9.7 mg/dL (ref 8.9–10.3)
Creatinine, Ser: 1.21 mg/dL — ABNORMAL HIGH (ref 0.44–1.00)
GFR calc Af Amer: 51 mL/min — ABNORMAL LOW (ref >60)
GFR calc non Af Amer: 44 mL/min — ABNORMAL LOW (ref >60)
GLUCOSE: 148 mg/dL — AB (ref 65–99)
POTASSIUM: 4 mmol/L (ref 3.5–5.1)
Sodium: 144 mmol/L (ref 135–145)

## 2017-05-02 LAB — CBC WITH DIFFERENTIAL/PLATELET
BASOS ABS: 0 10*3/uL (ref 0.0–0.1)
Basophils Relative: 0 %
Eosinophils Absolute: 0.2 10*3/uL (ref 0.0–0.7)
Eosinophils Relative: 3 %
HEMATOCRIT: 38.7 % (ref 36.0–46.0)
Hemoglobin: 12.1 g/dL (ref 12.0–15.0)
LYMPHS PCT: 39 %
Lymphs Abs: 2 10*3/uL (ref 0.7–4.0)
MCH: 28.9 pg (ref 26.0–34.0)
MCHC: 31.3 g/dL (ref 30.0–36.0)
MCV: 92.4 fL (ref 78.0–100.0)
MONO ABS: 0.4 10*3/uL (ref 0.1–1.0)
MONOS PCT: 7 %
NEUTROS ABS: 2.6 10*3/uL (ref 1.7–7.7)
Neutrophils Relative %: 51 %
Platelets: 160 10*3/uL (ref 150–400)
RBC: 4.19 MIL/uL (ref 3.87–5.11)
RDW: 14.5 % (ref 11.5–15.5)
WBC: 5.1 10*3/uL (ref 4.0–10.5)

## 2017-05-02 LAB — I-STAT TROPONIN, ED: Troponin i, poc: 0.01 ng/mL (ref 0.00–0.08)

## 2017-05-02 LAB — I-STAT CG4 LACTIC ACID, ED: Lactic Acid, Venous: 0.61 mmol/L (ref 0.5–1.9)

## 2017-05-02 MED ORDER — DOXYCYCLINE HYCLATE 100 MG PO CAPS: 100.0000 mg | ORAL_CAPSULE | Freq: Two times a day (BID) | ORAL | 0 refills | Status: AC

## 2017-05-02 MED ORDER — ALBUTEROL SULFATE (2.5 MG/3ML) 0.083% IN NEBU
5.0000 mg | INHALATION_SOLUTION | Freq: Once | RESPIRATORY_TRACT | Status: AC
  Administered 2017-05-02: 5 mg via RESPIRATORY_TRACT
  Filled 2017-05-02: qty 6

## 2017-05-02 MED ORDER — BENZONATATE 100 MG PO CAPS: 100.0000 mg | ORAL_CAPSULE | Freq: Three times a day (TID) | ORAL | 0 refills | Status: DC

## 2017-05-02 MED ORDER — IPRATROPIUM BROMIDE 0.02 % IN SOLN
0.5000 mg | Freq: Once | RESPIRATORY_TRACT | Status: AC
  Administered 2017-05-02: 0.5 mg via RESPIRATORY_TRACT
  Filled 2017-05-02: qty 2.5

## 2017-05-02 MED ORDER — GUAIFENESIN 100 MG/5ML PO SOLN
5.0000 mL | Freq: Once | ORAL | Status: DC
  Filled 2017-05-02: qty 10

## 2017-05-02 NOTE — ED Triage Notes (Signed)
Pt c/o cough, sore throat, nasal congestion and feeling SOB when lays down at night since Saturday. Pt has sleep apnea.

## 2017-05-02 NOTE — ED Notes (Signed)
Patient ambulated well in the hall o2 only down too 93

## 2017-05-02 NOTE — ED Notes (Signed)
Pt adds had diarrhea over the weekend but none today.

## 2017-05-02 NOTE — Discharge Instructions (Addendum)
Please call Dr. Deforest Hoyles when his office reopens tomorrow to schedule a follow-up appointment in 2 to 3 days.  Take 1 tablet of doxycycline twice daily for the next 7 days.  Use 2 puffs of your albuterol inhaler every 4 hours as needed for shortness of breath.  Take 1 tablet of benzonatate every 8 hours as needed for cough.  You can take 650 mg of Tylenol every 6 hours as needed for pain with coughing or fever.  Please make sure not to take more than 4000 mg of Tylenol in a 24-hour period.  If you develop new or worsening symptoms including severe shortness of breath or chest pain, a fever despite taking Tylenol, feeling as if he might pass out, or other new concerning symptoms, please return to the emergency department for re-evaluation.

## 2017-05-02 NOTE — ED Provider Notes (Signed)
[facility] DEPT Provider Note   CSN: [id number] Arrival date & time: 05/02/17  1252     History   Chief Complaint Chief Complaint  Patient presents with  . Cough  . Sore Throat  . Shortness of Breath    HPI [name] is a 72 y.o. female with history of OSA on CPAP diabetes mellitus, CKD stage III, asthma, HTN, hypothyroid, and GERD who presents to the emergency department with a chief complaint of nonproductive cough, sore throat, nasal drip, and nasal congestion that began 3 days ago.   She reports that she has been using her CPAP at night, but has been feeling more short of breath when she lays down for the last 2 nights.  No dyspnea in the ED.  She also endorses chest discomfort with coughing that resolves when she finishes coughing.  She reports sore throat is worse in the morning. no fever, chills, nausea, vomiting, diarrhea, abdominal pain, difficulty swallowing, muffled voice, drooling, trismus, facial or neck swelling, rash, dizziness, headache, neck pain, or back pain pain.  She states that she is treated her symptoms with 3 tablets of amoxicillin that she found from a previous prescription at home since her symptoms began 3 days ago. She used her home albuterol inhaler once. No other treatment prior to arrival.  The history is provided by the patient. No language interpreter was used.  Cough  Associated symptoms include sore throat and shortness of breath. Pertinent negatives include no chest pain, no chills and no headaches.  Sore Throat  Associated symptoms include shortness of breath. Pertinent negatives include no chest pain, no abdominal pain and no headaches.  Shortness of Breath  Associated symptoms include sore throat and cough. Pertinent negatives include no fever, no headaches, no chest pain, no vomiting, no abdominal pain and no rash.    Past Medical History:  Diagnosis Date  . Anxiety   . Arthritis   . Asthma   . Diabetes  mellitus   . GERD (gastroesophageal reflux disease)   . Hyperlipidemia   . Hypertension   . Hypothyroid   . Sleep apnea    use CPAP nightly    Patient Active Problem List   Diagnosis Date Noted  . Intractable nausea and vomiting 09/23/2015  . Diabetes mellitus ([place]) 09/23/2015  . CKD (chronic kidney disease), stage III ([place]) 09/23/2015  . Hypokalemia 09/23/2015  . Elevated troponin 09/23/2015    Past Surgical History:  Procedure Laterality Date  . ABDOMINAL HYSTERECTOMY    . CHOLECYSTECTOMY    . COLONOSCOPY WITH PROPOFOL N/A 08/29/2012   Procedure: COLONOSCOPY WITH PROPOFOL;  Surgeon: Garlan Fair, MD;  Location: [facility];  Service: Endoscopy;  Laterality: N/A;  . EYE SURGERY    . IR GENERIC HISTORICAL  01/14/2016   IR RADIOLOGIST EVAL & MGMT 01/14/2016 [facility] INTERV RAD     OB History   None      Home Medications    Prior to Admission medications   Medication Sig Start Date End Date Taking? Authorizing Provider  albuterol (PROVENTIL HFA;VENTOLIN HFA) 108 (90 BASE) MCG/ACT inhaler Inhale 2 puffs into the lungs every 6 (six) hours as needed for wheezing.   Yes [provider]  ALPRAZolam (XANAX) 0.25 MG tablet Take 0.125-0.25 mg by mouth 3 (three) times daily as needed for anxiety.    Yes [provider]  amLODipine (NORVASC) 5 MG tablet Take 5 mg by mouth at bedtime.   Yes [provider]  aspirin EC 81 MG tablet Take 81 mg by mouth daily.   Yes [provider]  atorvastatin (LIPITOR) 40 MG tablet Take 40 mg by mouth at bedtime.    Yes [provider]  candesartan (ATACAND) 32 MG tablet Take 32 mg by mouth at bedtime.   Yes [provider]  cloNIDine (CATAPRES - DOSED IN MG/24 HR) 0.1 mg/24hr patch Place 0.2 patches onto the skin every Sunday. On Sundays   Yes [provider]  doxazosin (CARDURA) 8 MG tablet Take 4 mg by mouth at bedtime.    Yes [provider]  fluconazole (DIFLUCAN) 150 MG  tablet TAKE FIRST TABLET ON DAY 1 FOLLOWED BY SECOND TABLET IN 3-4- DAYS = FOR YEAST INFECTION 03/10/17  Yes [provider]  hydrochlorothiazide (HYDRODIURIL) 25 MG tablet Take 50 mg by mouth at bedtime.    Yes [provider]  HYDROcodone-acetaminophen (NORCO) 7.5-325 MG tablet Take 0.5 tablets by mouth daily as needed for pain.  02/07/17  Yes [provider]  insulin glargine (LANTUS) 100 UNIT/ML injection Inject 85 Units into the skin at bedtime.    Yes [provider]  insulin lispro (HUMALOG) 100 UNIT/ML injection Inject 4-16 Units into the skin 3 (three) times daily before meals. Sliding scale, she decides how much to give depending on how much she eats   Yes [provider]  levothyroxine (SYNTHROID, LEVOTHROID) 200 MCG tablet Take 200 mcg by mouth daily before breakfast.    Yes [provider]  metoprolol (TOPROL-XL) 50 MG 24 hr tablet Take 50 mg by mouth daily.    Yes [provider]  ondansetron (ZOFRAN-ODT) 4 MG disintegrating tablet 1 TABLET AS NEEDED FOR NAUSEA EVERY 8 HOURS ORALLY 10 DAYS 03/31/17  Yes [provider]  pantoprazole (PROTONIX) 20 MG tablet Take 1 tablet (20 mg total) by mouth 2 (two) times daily. 07/17/14  Yes Virgel Manifold, MD  valACYclovir (VALTREX) 500 MG tablet ONE TWICE A DAY FOR 7 DAYS = FOR HERPES OUTBREAK 04/22/17  Yes [provider]  Vitamin D, Ergocalciferol, (DRISDOL) 50000 UNITS CAPS Take 50,000 Units by mouth every 7 (seven) days. Thursdays   Yes [provider]  benzonatate (TESSALON) 100 MG capsule Take 1 capsule (100 mg total) by mouth every 8 (eight) hours. 05/02/17   McDonald, Mia A, PA-C  doxycycline (VIBRAMYCIN) 100 MG capsule Take 1 capsule (100 mg total) by mouth 2 (two) times daily for 7 days. 05/02/17 05/09/17  McDonald, Laymond Purser, PA-C    Family History Family History  Adopted: Yes    Social History Social History   Tobacco Use  . Smoking status: Never Smoker    . Smokeless tobacco: Never Used  Substance Use Topics  . Alcohol use: No  . Drug use: No     Allergies   Esomeprazole magnesium; Aliskiren; Augmentin [amoxicillin-pot clavulanate]; Avelox [moxifloxacin hcl in nacl]; Azithromycin; Benzonatate; Codeine; Demerol [meperidine]; Duloxetine; Erythromycin base; Exenatide; Keflex [cephalexin]; Levofloxacin; Lisinopril; Metformin; Nifedipine; Nitrofurantoin macrocrystal; Paroxetine hcl; Rosiglitazone; Sitagliptin; Spironolactone; and Sulfamethoxazole   Review of Systems Review of Systems  Constitutional: Negative for activity change, chills and fever.  HENT: Positive for postnasal drip and sore throat. Negative for congestion, drooling, sinus pain, trouble swallowing and voice change.   Respiratory: Positive for cough and shortness of breath.   Cardiovascular: Negative for chest pain.  Gastrointestinal: Negative for abdominal pain, diarrhea, nausea and vomiting.  Genitourinary: Negative for dysuria.  Musculoskeletal: Negative for back pain.  Skin: Negative for rash.  Allergic/Immunologic: Negative for immunocompromised state.  Neurological: Negative for headaches.  Psychiatric/Behavioral: Negative for confusion.     Physical Exam Updated Vital Signs BP 136/77   Pulse 71   Temp 98.2 F (36.8 C) (Oral)   Resp 16   SpO2 94%   Physical Exam  Constitutional: No distress.  Morbidly obese female  HENT:  Head: Normocephalic.  Right Ear: Hearing, tympanic membrane and external ear normal.  Left Ear: Hearing, tympanic membrane and external ear normal.  Nose: No mucosal edema. Right sinus exhibits maxillary sinus tenderness. Right sinus exhibits no frontal sinus tenderness. Left sinus exhibits maxillary sinus tenderness. Left sinus exhibits no frontal sinus tenderness.  Mouth/Throat: Uvula is midline and mucous membranes are normal. No oral lesions. No uvula swelling. Posterior oropharyngeal erythema present. No oropharyngeal exudate,  posterior oropharyngeal edema or tonsillar abscesses. No tonsillar exudate.  Eyes: Conjunctivae are normal.  Neck: Normal range of motion. Neck supple.  No meningismus  Cardiovascular: Normal rate, regular rhythm, normal heart sounds and intact distal pulses. Exam reveals no gallop and no friction rub.  No murmur heard. Pulmonary/Chest: Effort normal. No stridor. No respiratory distress. She exhibits no tenderness.  Crackles auscultated in the bilateral bases.  Lungs are otherwise clear to auscultation bilaterally.  Abdominal: Soft. She exhibits no distension.  Neurological: She is alert.  Skin: Skin is warm. Capillary refill takes less than 2 seconds. No rash noted.  Psychiatric: Her behavior is normal.  Nursing note and vitals reviewed.    ED Treatments / Results  Labs (all labs ordered are listed, but only abnormal results are displayed) Labs Reviewed  BASIC METABOLIC PANEL - Abnormal; Notable for the following components:      Result Value   Chloride 100 (*)    CO2 34 (*)    Glucose, Bld 148 (*)    Creatinine, Ser 1.21 (*)    GFR calc non Af Amer 44 (*)    GFR calc Af Amer 51 (*)    All other components within normal limits  CBC WITH DIFFERENTIAL/PLATELET  I-STAT TROPONIN, ED  I-STAT CG4 LACTIC ACID, ED    EKG EKG Interpretation  Date/Time:  Monday May 02 2017 13:03:16 EDT Ventricular Rate:  61 PR Interval:    QRS Duration: 127 QT Interval:  456 QTC Calculation: 460 R Axis:   -62 Text Interpretation:  Sinus rhythm Borderline prolonged PR interval Nonspecific IVCD with LAD Left ventricular hypertrophy Baseline wander When compared with ECG of 09/25/2015 No significant change was found Confirmed by Francine Graven [phone]) on 05/02/2017 8:40:17 PM   Radiology Dg Chest 2 View  Result Date: 05/02/2017 CLINICAL DATA:  Cough and short of breath EXAM: CHEST - 2 VIEW COMPARISON:  09/06/2014 FINDINGS: Mild bibasilar airspace disease. Negative for heart failure or  effusion. Heart size within normal limits. IMPRESSION: Mild bibasilar atelectasis/infiltrate. Electronically Signed   By: Franchot Gallo M.D.   On: 05/02/2017 14:02    Procedures Procedures (including critical care time)  Medications Ordered in ED Medications  guaiFENesin (ROBITUSSIN) 100 MG/5ML solution 100 mg (100 mg Oral Refused 05/02/17 2122)  albuterol (PROVENTIL) (2.5 MG/3ML) 0.083% nebulizer solution 5 mg (5 mg Nebulization Given 05/02/17 2047)  ipratropium (ATROVENT) nebulizer solution 0.5 mg (0.5 mg Nebulization Given 05/02/17 2047)     Initial Impression / Assessment and Plan / ED Course  I have reviewed the triage vital signs and the nursing notes.  Pertinent labs & imaging results that were available during my care of the patient were reviewed  by me and considered in my medical decision making (see chart for details).     71 year old with history of OSA on CPAP diabetes mellitus, CKD stage III, asthma, HTN, hypothyroid, and GERD who presents with nonproductive cough, sore throat, postnasal drip, nasal congestion, and dyspnea when laying flat at night x3 days.  Chest x-ray with bibasilar infiltrate.  No leukocytosis.   Bicarb minimally elevated at 34.  Glucose is 138 without anion gap.  I-STAT troponin is negative.  EKG is unchanged from previous.  Doubt PE, low suspicion for ACS, cardiac tamponade, influenza, DKA, or HHS. Nebulizer treatment given in the ED.  She is requesting medication for cough; however she declined Robitussin.  She states that she is always given Tussionex by her PCP.  Discussed that with her SaO2 around 93 to 95%, history of OSA, and home pain medications that I do not feel comfortable sending her home with another medication that could possibly depress her respiratory drive.  The patient states that this is always a medication that she is given for cough and that nothing else works.  The patient was discussed with Dr. Thurnell Garbe, attending physician who was agreeable  with the work-up and plan and who also did not feel that Tussionex was warranted based on her symptoms today.  Patient was later agreeable to a prescription of Tessalon Perles.  She was ambulated on pulse ox in the ED and maintain an SaO2 of 93%.  She reports that she did not feel short of breath while ambulating.  Will discharge the patient home with doxycycline to cover for CAP and recommended follow-up with PCP in 2 to 3 days for reexamination.  Strict return precautions given.  She is hemodynamically stable and in no acute distress.  She is safe for discharge to home with outpatient follow-up at this time.  Final Clinical Impressions(s) / ED Diagnoses   Final diagnoses:  Community acquired pneumonia of left lower lobe of lung ([place])  Community acquired pneumonia of right lower lobe of lung [facility])    ED Discharge Orders        Ordered    benzonatate (TESSALON) 100 MG capsule  Every 8 hours     05/02/17 2208    doxycycline (VIBRAMYCIN) 100 MG capsule  2 times daily     05/02/17 2208       Joline Maxcy A, PA-C 05/03/17 0111    Francine Graven, DO 05/04/17 1828

## 2017-06-26 ENCOUNTER — Encounter (HOSPITAL_COMMUNITY): Payer: Self-pay | Admitting: Nurse Practitioner

## 2017-06-26 ENCOUNTER — Emergency Department (HOSPITAL_COMMUNITY): Payer: Medicare Other

## 2017-06-26 ENCOUNTER — Emergency Department (HOSPITAL_COMMUNITY)
Admission: EM | Admit: 2017-06-26 | Discharge: 2017-06-26 | Disposition: A | Discharge status: Home / Self Care | Payer: Medicare Other | Attending: Emergency Medicine | Admitting: Emergency Medicine

## 2017-06-26 DIAGNOSIS — E119 Type 2 diabetes mellitus without complications: Secondary | ICD-10-CM | POA: Diagnosis not present

## 2017-06-26 DIAGNOSIS — E039 Hypothyroidism, unspecified: Secondary | ICD-10-CM | POA: Insufficient documentation

## 2017-06-26 DIAGNOSIS — I129 Hypertensive chronic kidney disease with stage 1 through stage 4 chronic kidney disease, or unspecified chronic kidney disease: Secondary | ICD-10-CM | POA: Insufficient documentation

## 2017-06-26 DIAGNOSIS — Z79899 Other long term (current) drug therapy: Secondary | ICD-10-CM | POA: Insufficient documentation

## 2017-06-26 DIAGNOSIS — J45909 Unspecified asthma, uncomplicated: Secondary | ICD-10-CM | POA: Diagnosis not present

## 2017-06-26 DIAGNOSIS — N183 Chronic kidney disease, stage 3 (moderate): Secondary | ICD-10-CM | POA: Diagnosis not present

## 2017-06-26 DIAGNOSIS — K210 Gastro-esophageal reflux disease with esophagitis: Secondary | ICD-10-CM | POA: Diagnosis not present

## 2017-06-26 DIAGNOSIS — K219 Gastro-esophageal reflux disease without esophagitis: Secondary | ICD-10-CM

## 2017-06-26 DIAGNOSIS — Z794 Long term (current) use of insulin: Secondary | ICD-10-CM | POA: Diagnosis not present

## 2017-06-26 DIAGNOSIS — R079 Chest pain, unspecified: Secondary | ICD-10-CM | POA: Diagnosis present

## 2017-06-26 LAB — BLOOD GAS, VENOUS
Acid-Base Excess: 7.4 mmol/L — ABNORMAL HIGH (ref 0.0–2.0)
Bicarbonate: 33.5 mmol/L — ABNORMAL HIGH (ref 20.0–28.0)
O2 Saturation: 79.7 %
PATIENT TEMPERATURE: 98.6
PH VEN: 7.385 (ref 7.250–7.430)
pCO2, Ven: 57.3 mmHg (ref 44.0–60.0)
pO2, Ven: 45.7 mmHg — ABNORMAL HIGH (ref 32.0–45.0)

## 2017-06-26 LAB — CBC WITH DIFFERENTIAL/PLATELET
BASOS ABS: 0 10*3/uL (ref 0.0–0.1)
Basophils Relative: 0 %
Eosinophils Absolute: 0.1 10*3/uL (ref 0.0–0.7)
Eosinophils Relative: 2 %
HEMATOCRIT: 37.1 % (ref 36.0–46.0)
Hemoglobin: 11.9 g/dL — ABNORMAL LOW (ref 12.0–15.0)
LYMPHS ABS: 2.2 10*3/uL (ref 0.7–4.0)
LYMPHS PCT: 36 %
MCH: 29.5 pg (ref 26.0–34.0)
MCHC: 32.1 g/dL (ref 30.0–36.0)
MCV: 92.1 fL (ref 78.0–100.0)
MONO ABS: 0.4 10*3/uL (ref 0.1–1.0)
Monocytes Relative: 6 %
NEUTROS ABS: 3.4 10*3/uL (ref 1.7–7.7)
Neutrophils Relative %: 56 %
PLATELETS: 184 10*3/uL (ref 150–400)
RBC: 4.03 MIL/uL (ref 3.87–5.11)
RDW: 14.7 % (ref 11.5–15.5)
WBC: 6.1 10*3/uL (ref 4.0–10.5)

## 2017-06-26 LAB — COMPREHENSIVE METABOLIC PANEL
ALBUMIN: 3.5 g/dL (ref 3.5–5.0)
ALT: 14 U/L (ref 14–54)
ANION GAP: 8 (ref 5–15)
AST: 15 U/L (ref 15–41)
Alkaline Phosphatase: 61 U/L (ref 38–126)
BUN: 23 mg/dL — AB (ref 6–20)
CHLORIDE: 103 mmol/L (ref 101–111)
CO2: 34 mmol/L — ABNORMAL HIGH (ref 22–32)
Calcium: 9.4 mg/dL (ref 8.9–10.3)
Creatinine, Ser: 1.27 mg/dL — ABNORMAL HIGH (ref 0.44–1.00)
GFR calc Af Amer: 48 mL/min — ABNORMAL LOW (ref >60)
GFR calc non Af Amer: 42 mL/min — ABNORMAL LOW (ref >60)
GLUCOSE: 90 mg/dL (ref 65–99)
POTASSIUM: 3.7 mmol/L (ref 3.5–5.1)
Sodium: 145 mmol/L (ref 135–145)
Total Bilirubin: 0.7 mg/dL (ref 0.3–1.2)
Total Protein: 7.1 g/dL (ref 6.5–8.1)

## 2017-06-26 LAB — MAGNESIUM: MAGNESIUM: 1.9 mg/dL (ref 1.7–2.4)

## 2017-06-26 LAB — D-DIMER, QUANTITATIVE: D-Dimer, Quant: 0.31 ug/mL-FEU (ref 0.00–0.50)

## 2017-06-26 MED ORDER — GI COCKTAIL ~~LOC~~
30.0000 mL | Freq: Once | ORAL | Status: AC
  Administered 2017-06-26: 30 mL via ORAL
  Filled 2017-06-26: qty 30

## 2017-06-26 NOTE — Discharge Instructions (Addendum)
To help treat your discomfort, use Maalox, 2 tablespoons, before meals and at bedtime.  You can also use Maalox additionally if needed for reflux, or Rolaids if you desire to treat the symptoms.  It is important to see your doctor soon as possible for medication adjustment.  Return here, if needed, for problems.

## 2017-06-26 NOTE — ED Provider Notes (Signed)
[facility] DEPT Provider Note   CSN: [id number] Arrival date & time: 06/26/17  1617     History   Chief Complaint Chief Complaint  Patient presents with  . Shortness of Breath    HPI [name] is a 72 y.o. female.  HPI  She presents for evaluation of "burning," sensation in her chest, which started today.  She has had this previously and been diagnosed with pneumonia when she had it.  She feels like the symptoms worsens when she falls asleep, therefore became concerned that she might stop breathing so came here.  She was recently told that she needs to stop taking her PPI because of the risk of hypomagnesemia.  She denies nausea, vomiting, fever, chills, cough, shortness of breath, weakness or dizziness.  There are no other known modifying factors.   Past Medical History:  Diagnosis Date  . Anxiety   . Arthritis   . Asthma   . Diabetes mellitus   . GERD (gastroesophageal reflux disease)   . Hyperlipidemia   . Hypertension   . Hypothyroid   . Sleep apnea    use CPAP nightly    Patient Active Problem List   Diagnosis Date Noted  . Intractable nausea and vomiting 09/23/2015  . Diabetes mellitus ([place]) 09/23/2015  . CKD (chronic kidney disease), stage III ([place]) 09/23/2015  . Hypokalemia 09/23/2015  . Elevated troponin 09/23/2015    Past Surgical History:  Procedure Laterality Date  . ABDOMINAL HYSTERECTOMY    . CHOLECYSTECTOMY    . COLONOSCOPY WITH PROPOFOL N/A 08/29/2012   Procedure: COLONOSCOPY WITH PROPOFOL;  Surgeon: Garlan Fair, MD;  Location: [facility];  Service: Endoscopy;  Laterality: N/A;  . EYE SURGERY    . IR GENERIC HISTORICAL  01/14/2016   IR RADIOLOGIST EVAL & MGMT 01/14/2016 [facility] INTERV RAD     OB History   None      Home Medications    Prior to Admission medications   Medication Sig Start Date End Date Taking? Authorizing Provider  albuterol (PROVENTIL HFA;VENTOLIN HFA) 108 (90 BASE) MCG/ACT  inhaler Inhale 2 puffs into the lungs every 6 (six) hours as needed for wheezing.   Yes [provider]  ALPRAZolam (XANAX) 0.25 MG tablet Take 0.125-0.25 mg by mouth 3 (three) times daily as needed for anxiety.    Yes [provider]  amLODipine (NORVASC) 5 MG tablet Take 5 mg by mouth at bedtime.   Yes [provider]  aspirin EC 81 MG tablet Take 81 mg by mouth daily.   Yes [provider]  atorvastatin (LIPITOR) 40 MG tablet Take 40 mg by mouth at bedtime.    Yes [provider]  candesartan (ATACAND) 32 MG tablet Take 32 mg by mouth at bedtime.   Yes [provider]  cloNIDine (CATAPRES - DOSED IN MG/24 HR) 0.2 mg/24hr patch Place 0.2 mg onto the skin once a week.   Yes [provider]  doxazosin (CARDURA) 8 MG tablet Take 4 mg by mouth at bedtime.    Yes [provider]  fluconazole (DIFLUCAN) 150 MG tablet TAKE FIRST TABLET ON DAY 1 FOLLOWED BY SECOND TABLET IN 3-4- DAYS PRN FOR YEAST INFECTION 03/10/17  Yes [provider]  fluticasone (FLONASE) 50 MCG/ACT nasal spray 2 SPRAY IN EACH NOSTRIL ONCE A DAY PRN FOR ALLERGIES 06/06/17  Yes [provider]  hydrochlorothiazide (HYDRODIURIL) 25 MG tablet Take 50 mg by mouth at bedtime.    Yes [provider]  HYDROcodone-acetaminophen (NORCO) 7.5-325 MG tablet Take 0.5 tablets by mouth daily as needed for pain.  02/07/17  Yes [provider]  insulin glargine (LANTUS) 100 UNIT/ML injection Inject 75 Units into the skin at bedtime.    Yes [provider]  insulin lispro (HUMALOG) 100 UNIT/ML injection Inject 4-16 Units into the skin 3 (three) times daily before meals. Sliding scale, she decides how much to give depending on how much she eats   Yes [provider]  levothyroxine (SYNTHROID, LEVOTHROID) 200 MCG tablet Take 200 mcg by mouth daily before breakfast.    Yes [provider]  metoprolol (TOPROL-XL) 50 MG 24 hr  tablet Take 50 mg by mouth at bedtime.    Yes [provider]  ondansetron (ZOFRAN-ODT) 4 MG disintegrating tablet 1 TABLET AS NEEDED FOR NAUSEA EVERY 8 HOURS ORALLY 10 DAYS 03/31/17  Yes [provider]  pantoprazole (PROTONIX) 20 MG tablet Take 1 tablet (20 mg total) by mouth 2 (two) times daily. 07/17/14  Yes Virgel Manifold, MD  benzonatate (TESSALON) 100 MG capsule Take 1 capsule (100 mg total) by mouth every 8 (eight) hours. Patient not taking: Reported on 06/26/2017 05/02/17   McDonald, Mia A, PA-C  ketoconazole (NIZORAL) 2 % cream APPLY TO AFFECTED AREA EVERY DAY AS NEEDED FOR REDNESS AND ITCHING 06/16/17   [provider]  valACYclovir (VALTREX) 500 MG tablet ONE TWICE A DAY FOR 7 DAYS PRN FOR HERPES OUTBREAK 04/22/17   [provider]  Vitamin D, Ergocalciferol, (DRISDOL) 50000 UNITS CAPS Take 50,000 Units by mouth every 7 (seven) days. Thursdays    [provider]    Family History Family History  Adopted: Yes    Social History Social History   Tobacco Use  . Smoking status: Never Smoker  . Smokeless tobacco: Never Used  Substance Use Topics  . Alcohol use: No  . Drug use: No     Allergies   Esomeprazole magnesium; Aliskiren; Augmentin [amoxicillin-pot clavulanate]; Avelox [moxifloxacin hcl in nacl]; Azithromycin; Benzonatate; Codeine; Demerol [meperidine]; Duloxetine; Erythromycin base; Exenatide; Keflex [cephalexin]; Levofloxacin; Lisinopril; Metformin; Nifedipine; Nitrofurantoin macrocrystal; Paroxetine hcl; Rosiglitazone; Sitagliptin; Spironolactone; and Sulfamethoxazole   Review of Systems Review of Systems  All other systems reviewed and are negative.    Physical Exam Updated Vital Signs BP 132/83   Pulse (!) 58   Temp 98.5 F (36.9 C) (Oral)   Resp 16   SpO2 93%   Physical Exam  Constitutional: She is oriented to person, place, and time. She appears well-developed. She does not appear ill. She appears distressed (She  is uncomfortable).  Obese  HENT:  Head: Normocephalic and atraumatic.  Eyes: Pupils are equal, round, and reactive to light. Conjunctivae and EOM are normal.  Neck: Normal range of motion and phonation normal. Neck supple.  Cardiovascular: Normal rate and regular rhythm.  Pulmonary/Chest: Effort normal and breath sounds normal. No stridor. No respiratory distress. She has no wheezes. She exhibits no tenderness.  Abdominal: Soft. She exhibits no distension. There is no tenderness. There is no guarding.  Musculoskeletal: Normal range of motion.  Neurological: She is alert and oriented to person, place, and time. She exhibits normal muscle tone.  Skin: Skin is warm and dry.  Psychiatric: She has a normal mood and affect. Her behavior is normal. Judgment and thought content normal.  Nursing note and vitals reviewed.    ED Treatments / Results  Labs (all labs ordered are listed, but only abnormal results are displayed) Labs  Reviewed  BLOOD GAS, VENOUS - Abnormal; Notable for the following components:      Result Value   pO2, Ven 45.7 (*)    Bicarbonate 33.5 (*)    Acid-Base Excess 7.4 (*)    All other components within normal limits  COMPREHENSIVE METABOLIC PANEL - Abnormal; Notable for the following components:   CO2 34 (*)    BUN 23 (*)    Creatinine, Ser 1.27 (*)    GFR calc non Af Amer 42 (*)    GFR calc Af Amer 48 (*)    All other components within normal limits  CBC WITH DIFFERENTIAL/PLATELET - Abnormal; Notable for the following components:   Hemoglobin 11.9 (*)    All other components within normal limits  D-DIMER, QUANTITATIVE (NOT AT [facility])  MAGNESIUM    EKG EKG Interpretation  Date/Time:  Sunday June 26 2017 16:28:19 EDT Ventricular Rate:  70 PR Interval:    QRS Duration: 127 QT Interval:  445 QTC Calculation: 481 R Axis:   -68 Text Interpretation:  Sinus rhythm Nonspecific IVCD with LAD Left ventricular hypertrophy Baseline wander in lead(s) II III aVR aVF  since last tracing no significant change Confirmed by Daleen Bo [phone]) on 06/26/2017 5:42:43 PM   Radiology Dg Chest 2 View  Result Date: 06/26/2017 CLINICAL DATA:  pneumonia, short of breath EXAM: CHEST - 2 VIEW COMPARISON:  05/02/2017 FINDINGS: Normal cardiac silhouette. Low lung volumes. Central venous pulmonary congestion similar prior. Trace pleural effusion. Degenerative osteophytosis of the spine. IMPRESSION: Trace pleural effusions central venous congestion. Electronically Signed   By: Suzy Bouchard M.D.   On: 06/26/2017 18:52    Procedures Procedures (including critical care time)  Medications Ordered in ED Medications  gi cocktail (Maalox,Lidocaine,Donnatal) (30 mLs Oral Given 06/26/17 1746)     Initial Impression / Assessment and Plan / ED Course  I have reviewed the triage vital signs and the nursing notes.  Pertinent labs & imaging results that were available during my care of the patient were reviewed by me and considered in my medical decision making (see chart for details).  Clinical Course as of Jun 27 2011  Sun Jun 26, 2017  1831 Normal except CO2 elevated, BUN high, creatinine, and GFR low  Comprehensive metabolic panel(!) [EW]  6301 Normal  D-dimer, quantitative [EW]  1831 Normal except hemoglobin low  CBC with Differential(!) [EW]  1832 Normal venous gas, to somewhat increased  Blood gas, venous(!) [EW]  1920 DG Chest 2 View [EW]  1920 Trace pleural effusions, with mild vascular congestion.  Images reviewed.  DG Chest 2 View [EW]    Clinical Course User Index [EW] Daleen Bo, MD     Patient Vitals for the past 24 hrs:  BP Temp Temp src Pulse Resp SpO2  06/26/17 1747 132/83 - - (!) 58 16 93 %  06/26/17 1622 (!) 169/73 98.5 F (36.9 C) Oral 73 10 94 %    8:13 PM Reevaluation with update and discussion. After initial assessment and treatment, an updated evaluation reveals she is comfortable has no further complaints.  Findings discussed  and questions answered. [place] Decision Making:   CRITICAL CARE-no Performed by: Daleen Bo   Nursing Notes Reviewed/ Care Coordinated Applicable Imaging Reviewed Interpretation of Laboratory Data incorporated into ED treatment  The patient appears reasonably screened and/or stabilized for discharge and I doubt any other medical condition or other [facility] requiring further screening, evaluation, or treatment in the ED at this time prior  to discharge.  Plan: Home Medications-use  an antacid such as Maalox AC at bedtime, and as needed, continue usual medications; Home Treatments-rest, fluids; return here if the recommended treatment, does not improve the symptoms; Recommended follow up-PCP follow-up soon as possible for medication adjustment.     Final Clinical Impressions(s) / ED Diagnoses   Final diagnoses:  Gastroesophageal reflux disease, esophagitis presence not specified    ED Discharge Orders    None       Daleen Bo, MD 06/26/17 2018

## 2017-06-26 NOTE — ED Triage Notes (Signed)
Pt is c/o shortness of breath, she states that she has been receiving different antibiotics for community acquired pneumonia since easter of this year, she states the symptoms have somewhat worsened but unsure if the shortness is related to her hx of GERD.

## 2017-07-05 ENCOUNTER — Ambulatory Visit (INDEPENDENT_AMBULATORY_CARE_PROVIDER_SITE_OTHER): Payer: Medicare Other | Admitting: Podiatry

## 2017-07-05 ENCOUNTER — Encounter: Payer: Self-pay | Admitting: Podiatry

## 2017-07-05 DIAGNOSIS — M79674 Pain in right toe(s): Secondary | ICD-10-CM

## 2017-07-05 DIAGNOSIS — M79675 Pain in left toe(s): Secondary | ICD-10-CM | POA: Diagnosis not present

## 2017-07-05 DIAGNOSIS — E119 Type 2 diabetes mellitus without complications: Secondary | ICD-10-CM

## 2017-07-05 DIAGNOSIS — B351 Tinea unguium: Secondary | ICD-10-CM

## 2017-07-05 NOTE — Progress Notes (Signed)
Complaint:  Visit Type: Patient returns to my office for continued preventative foot care services. Complaint: Patient states" my nails have grown long and thick and become painful to walk and wear shoes" Patient has been diagnosed with DM with no foot complications. The patient presents for preventative foot care services. No changes to ROS  Podiatric Exam: Vascular: dorsalis pedis and posterior tibial pulses are palpable bilateral. Capillary return is immediate. Temperature gradient is WNL. Skin turgor WNL  Sensorium: Normal Semmes Weinstein monofilament test. Normal tactile sensation bilaterally. Nail Exam: Pt has thick disfigured discolored nails with subungual debris noted bilateral entire nail hallux through fifth toenails Ulcer Exam: There is no evidence of ulcer or pre-ulcerative changes or infection. Orthopedic Exam: Muscle tone and strength are WNL. No limitations in general ROM. No crepitus or effusions noted. Foot type and digits show no abnormalities. Hallux malleus. Skin: No Porokeratosis. No infection or ulcers  Diagnosis:  Onychomycosis, , Pain in right toe, pain in left toes  Treatment & Plan Procedures and Treatment: Consent by patient was obtained for treatment procedures.   Debridement of mycotic and hypertrophic toenails, 1 through 5 bilateral and clearing of subungual debris. No ulceration, no infection noted.  Return Visit-Office Procedure: Patient instructed to return to the office for a follow up visit 3 months for continued evaluation and treatment.    Endya Austin DPM 

## 2017-10-04 ENCOUNTER — Ambulatory Visit (INDEPENDENT_AMBULATORY_CARE_PROVIDER_SITE_OTHER): Payer: Medicare Other | Admitting: Podiatry

## 2017-10-04 ENCOUNTER — Encounter: Payer: Self-pay | Admitting: Podiatry

## 2017-10-04 DIAGNOSIS — M79675 Pain in left toe(s): Secondary | ICD-10-CM

## 2017-10-04 DIAGNOSIS — E119 Type 2 diabetes mellitus without complications: Secondary | ICD-10-CM

## 2017-10-04 DIAGNOSIS — M79674 Pain in right toe(s): Secondary | ICD-10-CM | POA: Diagnosis not present

## 2017-10-04 DIAGNOSIS — B351 Tinea unguium: Secondary | ICD-10-CM | POA: Diagnosis not present

## 2017-10-04 NOTE — Progress Notes (Signed)
Complaint:  Visit Type: Patient returns to my office for continued preventative foot care services. Complaint: Patient states" my nails have grown long and thick and become painful to walk and wear shoes" Patient has been diagnosed with DM with no foot complications. The patient presents for preventative foot care services. No changes to ROS  Podiatric Exam: Vascular: dorsalis pedis and posterior tibial pulses are palpable bilateral. Capillary return is immediate. Temperature gradient is WNL. Skin turgor WNL  Sensorium: Normal Semmes Weinstein monofilament test. Normal tactile sensation bilaterally. Nail Exam: Pt has thick disfigured discolored nails with subungual debris noted bilateral entire nail hallux through fifth toenails Ulcer Exam: There is no evidence of ulcer or pre-ulcerative changes or infection. Orthopedic Exam: Muscle tone and strength are WNL. No limitations in general ROM. No crepitus or effusions noted. Foot type and digits show no abnormalities. Hallux malleus. Skin: No Porokeratosis. No infection or ulcers  Diagnosis:  Onychomycosis, , Pain in right toe, pain in left toes  Treatment & Plan Procedures and Treatment: Consent by patient was obtained for treatment procedures.   Debridement of mycotic and hypertrophic toenails, 1 through 5 bilateral and clearing of subungual debris. No ulceration, no infection noted.  Return Visit-Office Procedure: Patient instructed to return to the office for a follow up visit 3 months for continued evaluation and treatment.    Mercadies Co DPM 

## 2017-12-27 ENCOUNTER — Encounter: Payer: Self-pay | Admitting: Podiatry

## 2018-01-10 ENCOUNTER — Ambulatory Visit (INDEPENDENT_AMBULATORY_CARE_PROVIDER_SITE_OTHER): Payer: Medicare Other | Admitting: Podiatry

## 2018-01-10 ENCOUNTER — Encounter: Payer: Self-pay | Admitting: Podiatry

## 2018-01-10 DIAGNOSIS — M79674 Pain in right toe(s): Secondary | ICD-10-CM | POA: Diagnosis not present

## 2018-01-10 DIAGNOSIS — E119 Type 2 diabetes mellitus without complications: Secondary | ICD-10-CM | POA: Diagnosis not present

## 2018-01-10 DIAGNOSIS — B351 Tinea unguium: Secondary | ICD-10-CM

## 2018-01-10 DIAGNOSIS — M79675 Pain in left toe(s): Secondary | ICD-10-CM | POA: Diagnosis not present

## 2018-01-10 NOTE — Progress Notes (Signed)
Complaint:  Visit Type: Patient returns to my office for continued preventative foot care services. Complaint: Patient states" my nails have grown long and thick and become painful to walk and wear shoes" Patient has been diagnosed with DM with no foot complications. The patient presents for preventative foot care services. No changes to ROS  Podiatric Exam: Vascular: dorsalis pedis and posterior tibial pulses are palpable bilateral. Capillary return is immediate. Temperature gradient is WNL. Skin turgor WNL  Sensorium: Normal Semmes Weinstein monofilament test. Normal tactile sensation bilaterally. Nail Exam: Pt has thick disfigured discolored nails with subungual debris noted bilateral entire nail hallux through fifth toenails Ulcer Exam: There is no evidence of ulcer or pre-ulcerative changes or infection. Orthopedic Exam: Muscle tone and strength are WNL. No limitations in general ROM. No crepitus or effusions noted. Foot type and digits show no abnormalities. Hallux malleus. Skin: No Porokeratosis. No infection or ulcers  Diagnosis:  Onychomycosis, , Pain in right toe, pain in left toes  Treatment & Plan Procedures and Treatment: Consent by patient was obtained for treatment procedures.   Debridement of mycotic and hypertrophic toenails, 1 through 5 bilateral and clearing of subungual debris. No ulceration, no infection noted.  Return Visit-Office Procedure: Patient instructed to return to the office for a follow up visit 3 months for continued evaluation and treatment.    Gardiner Barefoot DPM

## 2018-04-05 ENCOUNTER — Ambulatory Visit: Payer: Medicare Other | Admitting: Podiatry

## 2018-07-05 ENCOUNTER — Ambulatory Visit (INDEPENDENT_AMBULATORY_CARE_PROVIDER_SITE_OTHER): Payer: Medicare Other | Admitting: Podiatry

## 2018-07-05 ENCOUNTER — Encounter: Payer: Self-pay | Admitting: Podiatry

## 2018-07-05 ENCOUNTER — Other Ambulatory Visit: Payer: Self-pay

## 2018-07-05 DIAGNOSIS — M79674 Pain in right toe(s): Secondary | ICD-10-CM

## 2018-07-05 DIAGNOSIS — M79675 Pain in left toe(s): Secondary | ICD-10-CM | POA: Diagnosis not present

## 2018-07-05 DIAGNOSIS — B351 Tinea unguium: Secondary | ICD-10-CM | POA: Diagnosis not present

## 2018-07-05 NOTE — Progress Notes (Signed)
Complaint:  Visit Type: Patient returns to my office for continued preventative foot care services. Complaint: Patient states" my nails have grown long and thick and become painful to walk and wear shoes" Patient has been diagnosed with DM with no foot complications. The patient presents for preventative foot care services. No changes to ROS  Podiatric Exam: Vascular: dorsalis pedis and posterior tibial pulses are palpable bilateral. Capillary return is immediate. Temperature gradient is WNL. Skin turgor WNL  Sensorium: Normal Semmes Weinstein monofilament test. Normal tactile sensation bilaterally. Nail Exam: Pt has thick disfigured discolored nails with subungual debris noted bilateral entire nail hallux through fifth toenails Ulcer Exam: There is no evidence of ulcer or pre-ulcerative changes or infection. Orthopedic Exam: Muscle tone and strength are WNL. No limitations in general ROM. No crepitus or effusions noted. Foot type and digits show no abnormalities. Hallux malleus. Skin: No Porokeratosis. No infection or ulcers  Diagnosis:  Onychomycosis, , Pain in right toe, pain in left toes  Treatment & Plan Procedures and Treatment: Consent by patient was obtained for treatment procedures.   Debridement of mycotic and hypertrophic toenails, 1 through 5 bilateral and clearing of subungual debris. No ulceration, no infection noted. Patient sustained a vertical nail break near the medial border right hallux nail.  No infection. Return Visit-Office Procedure: Patient instructed to return to the office for a follow up visit 3 months for continued evaluation and treatment.    Gardiner Barefoot DPM

## 2018-10-11 ENCOUNTER — Ambulatory Visit (INDEPENDENT_AMBULATORY_CARE_PROVIDER_SITE_OTHER): Payer: Medicare Other | Admitting: Podiatry

## 2018-10-11 ENCOUNTER — Other Ambulatory Visit: Payer: Self-pay

## 2018-10-11 ENCOUNTER — Encounter: Payer: Self-pay | Admitting: Podiatry

## 2018-10-11 DIAGNOSIS — M79675 Pain in left toe(s): Secondary | ICD-10-CM | POA: Diagnosis not present

## 2018-10-11 DIAGNOSIS — E119 Type 2 diabetes mellitus without complications: Secondary | ICD-10-CM

## 2018-10-11 DIAGNOSIS — B351 Tinea unguium: Secondary | ICD-10-CM | POA: Diagnosis not present

## 2018-10-11 DIAGNOSIS — M79674 Pain in right toe(s): Secondary | ICD-10-CM

## 2018-10-11 NOTE — Progress Notes (Signed)
Complaint:  Visit Type: Patient returns to my office for continued preventative foot care services. Complaint: Patient states" my nails have grown long and thick and become painful to walk and wear shoes" Patient has been diagnosed with DM with no foot complications. The patient presents for preventative foot care services. No changes to ROS  Podiatric Exam: Vascular: dorsalis pedis and posterior tibial pulses are palpable bilateral. Capillary return is immediate. Temperature gradient is WNL. Skin turgor WNL  Sensorium: Normal Semmes Weinstein monofilament test. Normal tactile sensation bilaterally. Nail Exam: Pt has thick disfigured discolored nails with subungual debris noted bilateral entire nail hallux through fifth toenails Ulcer Exam: There is no evidence of ulcer or pre-ulcerative changes or infection. Orthopedic Exam: Muscle tone and strength are WNL. No limitations in general ROM. No crepitus or effusions noted. Foot type and digits show no abnormalities. Hallux malleus. Skin: No Porokeratosis. No infection or ulcers  Diagnosis:  Onychomycosis, , Pain in right toe, pain in left toes  Treatment & Plan Procedures and Treatment: Consent by patient was obtained for treatment procedures.   Debridement of mycotic and hypertrophic toenails, 1 through 5 bilateral and clearing of subungual debris. No ulceration, no infection noted. Patient sustained a vertical nail break near the medial border right hallux nail.  No infection. Return Visit-Office Procedure: Patient instructed to return to the office for a follow up visit 3 months for continued evaluation and treatment.    Brynden Thune DPM 

## 2019-01-10 ENCOUNTER — Ambulatory Visit (INDEPENDENT_AMBULATORY_CARE_PROVIDER_SITE_OTHER): Payer: Medicare Other | Admitting: Podiatry

## 2019-01-10 ENCOUNTER — Other Ambulatory Visit: Payer: Self-pay

## 2019-01-10 ENCOUNTER — Encounter: Payer: Self-pay | Admitting: Podiatry

## 2019-01-10 DIAGNOSIS — B351 Tinea unguium: Secondary | ICD-10-CM

## 2019-01-10 DIAGNOSIS — M79674 Pain in right toe(s): Secondary | ICD-10-CM

## 2019-01-10 DIAGNOSIS — E119 Type 2 diabetes mellitus without complications: Secondary | ICD-10-CM | POA: Diagnosis not present

## 2019-01-10 DIAGNOSIS — M79675 Pain in left toe(s): Secondary | ICD-10-CM

## 2019-01-10 NOTE — Progress Notes (Signed)
Complaint:  Visit Type: Patient returns to my office for continued preventative foot care services. Complaint: Patient states" my nails have grown long and thick and become painful to walk and wear shoes" Patient has been diagnosed with DM with no foot complications. The patient presents for preventative foot care services. No changes to ROS  Podiatric Exam: Vascular: dorsalis pedis and posterior tibial pulses are palpable bilateral. Capillary return is immediate. Temperature gradient is WNL. Skin turgor WNL  Sensorium: Normal Semmes Weinstein monofilament test. Normal tactile sensation bilaterally. Nail Exam: Pt has thick disfigured discolored nails with subungual debris noted bilateral entire nail hallux through fifth toenails Ulcer Exam: There is no evidence of ulcer or pre-ulcerative changes or infection. Orthopedic Exam: Muscle tone and strength are WNL. No limitations in general ROM. No crepitus or effusions noted. Foot type and digits show no abnormalities. Hallux malleus. Skin: No Porokeratosis. No infection or ulcers  Diagnosis:  Onychomycosis, , Pain in right toe, pain in left toes  Treatment & Plan Procedures and Treatment: Consent by patient was obtained for treatment procedures.   Debridement of mycotic and hypertrophic toenails, 1 through 5 bilateral and clearing of subungual debris. No ulceration, no infection noted. Patient sustained a vertical nail break near the medial border right hallux nail.  No infection. Return Visit-Office Procedure: Patient instructed to return to the office for a follow up visit 3 months for continued evaluation and treatment.    Gardiner Barefoot DPM

## 2019-01-18 ENCOUNTER — Ambulatory Visit: Payer: Medicare Other | Attending: Internal Medicine

## 2019-01-18 DIAGNOSIS — Z20822 Contact with and (suspected) exposure to covid-19: Secondary | ICD-10-CM

## 2019-01-20 LAB — NOVEL CORONAVIRUS, NAA: SARS-CoV-2, NAA: NOT DETECTED

## 2019-02-26 ENCOUNTER — Telehealth: Payer: Self-pay

## 2019-02-26 NOTE — Telephone Encounter (Signed)

## 2019-03-08 ENCOUNTER — Ambulatory Visit: Payer: Medicare Other

## 2019-04-11 ENCOUNTER — Encounter: Payer: Self-pay | Admitting: Podiatry

## 2019-04-11 ENCOUNTER — Other Ambulatory Visit: Payer: Self-pay

## 2019-04-11 ENCOUNTER — Ambulatory Visit (INDEPENDENT_AMBULATORY_CARE_PROVIDER_SITE_OTHER): Payer: Medicare Other | Admitting: Podiatry

## 2019-04-11 VITALS — Temp 96.3°F

## 2019-04-11 DIAGNOSIS — M79674 Pain in right toe(s): Secondary | ICD-10-CM | POA: Diagnosis not present

## 2019-04-11 DIAGNOSIS — E119 Type 2 diabetes mellitus without complications: Secondary | ICD-10-CM

## 2019-04-11 DIAGNOSIS — B351 Tinea unguium: Secondary | ICD-10-CM

## 2019-04-11 DIAGNOSIS — M79675 Pain in left toe(s): Secondary | ICD-10-CM

## 2019-04-11 NOTE — Progress Notes (Signed)
This patient returns to my office for at risk foot care.  This patient requires this care by a professional since this patient will be at risk due to having diabetes and chronic kidney disease.  This patient is unable to cut nails herself since the patient cannot reach hierils.These nails are painful walking and wearing shoes.  This patient presents for at risk foot care today.  General Appearance  Alert, conversant and in no acute stress.  Vascular  Dorsalis pedis and posterior tibial  pulses are palpable  bilaterally.  Capillary return is within normal limits  bilaterally. Temperature is within normal limits  bilaterally.  Neurologic  Senn-Weinstein monofilament wire test within normal limits  bilaterally. Muscle power within normal limits bilaterally.  Nails Thick disfigured discolored nails with subungual debris  from hallux to fifth toes bilaterally. No evidence of bacterial infection or drainage bilaterally.  Orthopedic  No limitations of motion  feet .  No crepitus or effusions noted.  No bony pathology or digital deformities noted.  Skin  normotropic skin with no porokeratosis noted bilaterally.  No signs of infections or ulcers noted.     Onychomycosis  Pain in right toes  Pain in left toes  Consent was obtained for treatment procedures.   Mechanical debridement of nails 1-5  bilaterally performed with a nail nipper.  Filed with dremel without incident.    Return office visit    3 months                  Told patient to return for periodic foot care and evaluation due to potential at risk complications.   Gardiner Barefoot DPM

## 2019-07-11 ENCOUNTER — Encounter: Payer: Self-pay | Admitting: Podiatry

## 2019-07-11 ENCOUNTER — Ambulatory Visit (INDEPENDENT_AMBULATORY_CARE_PROVIDER_SITE_OTHER): Payer: Medicare Other | Admitting: Podiatry

## 2019-07-11 ENCOUNTER — Other Ambulatory Visit: Payer: Self-pay

## 2019-07-11 DIAGNOSIS — M79675 Pain in left toe(s): Secondary | ICD-10-CM | POA: Diagnosis not present

## 2019-07-11 DIAGNOSIS — M79674 Pain in right toe(s): Secondary | ICD-10-CM | POA: Diagnosis not present

## 2019-07-11 DIAGNOSIS — N183 Chronic kidney disease, stage 3 unspecified: Secondary | ICD-10-CM

## 2019-07-11 DIAGNOSIS — B351 Tinea unguium: Secondary | ICD-10-CM | POA: Diagnosis not present

## 2019-07-11 DIAGNOSIS — E119 Type 2 diabetes mellitus without complications: Secondary | ICD-10-CM | POA: Diagnosis not present

## 2019-07-11 NOTE — Progress Notes (Signed)
This patient returns to my office for at risk foot care.  This patient requires this care by a professional since this patient will be at risk due to having diabetes and chronic kidney disease.  This patient is unable to cut nails herself since the patient cannot reach hierils.These nails are painful walking and wearing shoes.  This patient presents for at risk foot care today.  General Appearance  Alert, conversant and in no acute stress.  Vascular  Dorsalis pedis and posterior tibial  pulses are palpable  bilaterally.  Capillary return is within normal limits  bilaterally. Temperature is within normal limits  bilaterally.  Neurologic  Senn-Weinstein monofilament wire test within normal limits  bilaterally. Muscle power within normal limits bilaterally.  Nails Thick disfigured discolored nails with subungual debris  from hallux to fifth toes bilaterally. Pincer nails . No evidence of bacterial infection or drainage bilaterally.  Orthopedic  No limitations of motion  feet .  No crepitus or effusions noted.  No bony pathology or digital deformities noted.  Skin  normotropic skin with no porokeratosis noted bilaterally.  No signs of infections or ulcers noted.     Onychomycosis  Pain in right toes  Pain in left toes  Consent was obtained for treatment procedures.   Mechanical debridement of nails 1-5  bilaterally performed with a nail nipper.  Filed with dremel without incident.    Return office visit    3 months                  Told patient to return for periodic foot care and evaluation due to potential at risk complications.   Gardiner Barefoot DPM

## 2019-09-03 ENCOUNTER — Encounter (INDEPENDENT_AMBULATORY_CARE_PROVIDER_SITE_OTHER): Payer: Medicare Other | Admitting: Ophthalmology

## 2019-10-02 ENCOUNTER — Ambulatory Visit (INDEPENDENT_AMBULATORY_CARE_PROVIDER_SITE_OTHER): Payer: Medicare Other | Admitting: Ophthalmology

## 2019-10-02 ENCOUNTER — Encounter (INDEPENDENT_AMBULATORY_CARE_PROVIDER_SITE_OTHER): Payer: Self-pay | Admitting: Ophthalmology

## 2019-10-02 ENCOUNTER — Other Ambulatory Visit: Payer: Self-pay

## 2019-10-02 DIAGNOSIS — Z794 Long term (current) use of insulin: Secondary | ICD-10-CM | POA: Diagnosis not present

## 2019-10-02 DIAGNOSIS — H31003 Unspecified chorioretinal scars, bilateral: Secondary | ICD-10-CM | POA: Diagnosis not present

## 2019-10-02 DIAGNOSIS — H43811 Vitreous degeneration, right eye: Secondary | ICD-10-CM | POA: Insufficient documentation

## 2019-10-02 DIAGNOSIS — H353121 Nonexudative age-related macular degeneration, left eye, early dry stage: Secondary | ICD-10-CM | POA: Diagnosis not present

## 2019-10-02 DIAGNOSIS — E113553 Type 2 diabetes mellitus with stable proliferative diabetic retinopathy, bilateral: Secondary | ICD-10-CM | POA: Diagnosis not present

## 2019-10-02 DIAGNOSIS — H35371 Puckering of macula, right eye: Secondary | ICD-10-CM

## 2019-10-02 DIAGNOSIS — H31009 Unspecified chorioretinal scars, unspecified eye: Secondary | ICD-10-CM | POA: Insufficient documentation

## 2019-10-02 NOTE — Progress Notes (Signed)
10/02/2019     CHIEF COMPLAINT Patient presents for Retina Follow Up   HISTORY OF PRESENT ILLNESS: [name] is a 74 y.o. female who presents to the clinic today for:   HPI    Retina Follow Up    Patient presents with  Diabetic Retinopathy.  In both eyes.  This started 7 months ago.  Severity is mild.  Duration of 7 months.  Since onset it is stable.          Comments    7 Month Diabetic F/U OU  Pt denies noticeable changes to [place] OU since last visit. Pt denies ocular pain, flashes of light, or floaters OU.  A1c: 7.6, 08/21/2019 LBS: 114 this AM       Last edited by Rockie Neighbours, [place] on 10/02/2019  9:28 AM. (History)      Referring physician: Wenda Low, MD [address] [place],  [place] [address]  HISTORICAL INFORMATION:   Selected notes from the MEDICAL RECORD NUMBER    Lab Results  Component Value Date   HGBA1C 7.9 (H) 09/23/2015     CURRENT MEDICATIONS: Current Outpatient Medications (Ophthalmic Drugs)  Medication Sig  . bacitracin ophthalmic ointment bacitracin 500 unit/gram eye ointment  APPLY A SMALL AMOUNT TO BOTH EYES NIGHTLY, START 1 WEEK PRIOR TO SURGERY  . brimonidine (ALPHAGAN) 0.2 % ophthalmic solution brimonidine 0.2 % eye drops  INSTILL 1 DROP IN RIGHT EYE TWICE A DAY, START 48 HOURS PRIOR TO SURGERY (Patient not taking: Reported on 10/02/2019)  . cyclopentolate (CYCLODRYL,CYCLOGYL) 2 % ophthalmic solution cyclopentolate 2 % eye drops  INSTILL 1 DROP IN RIGHT EYE THREE TIMES A DAY, START 24 HOURS PRIOR TO SURGERY (Patient not taking: Reported on 10/02/2019)  . ketorolac (ACULAR) 0.5 % ophthalmic solution ketorolac 0.5 % eye drops  INSTILL 1 DROP IN RIGHT EYE FOUR TIMES A DAY START 1 WEEK PRIOR TO SURGERY (Patient not taking: Reported on 10/02/2019)  . ofloxacin (OCUFLOX) 0.3 % ophthalmic solution ofloxacin 0.3 % eye drops  INSTILL 1 DROP IN BOTH EYES FOUR TIMES A DAY START 72 HOURS PRIOR TO SURGERY (Patient not taking:  Reported on 10/02/2019)  . prednisoLONE acetate (PRED FORTE) 1 % ophthalmic suspension prednisolone acetate 1 % eye drops,suspension  INSTILL 1 DROP IN RIGHT EYE FOUR TIMES A DAY START AFTER SURGERY   No current facility-administered medications for this visit. (Ophthalmic Drugs)   Current Outpatient Medications (Other)  Medication Sig  . Accu-Chek FastClix Lancets MISC USE TO TEST BLOOD SUGARS AT LEAST 5 TIMES A DAY (E11.65)  . albuterol (VENTOLIN HFA) 108 (90 Base) MCG/ACT inhaler Ventolin HFA 90 mcg/actuation aerosol inhaler  2 PUFFS AS NEEDED EVERY 6 HRS INHALATION 30 DAYS  . ALPRAZolam (XANAX) 0.25 MG tablet Take 0.125-0.25 mg by mouth 3 (three) times daily as needed for anxiety.   Marland Kitchen amLODipine (NORVASC) 5 MG tablet Take 5 mg by mouth at bedtime.  Marland Kitchen aspirin EC 81 MG tablet Take 81 mg by mouth daily.  Marland Kitchen atorvastatin (LIPITOR) 40 MG tablet Take 40 mg by mouth at bedtime.   . benzonatate (TESSALON) 100 MG capsule Take 1 capsule (100 mg total) by mouth every 8 (eight) hours.  Marland Kitchen buPROPion (WELLBUTRIN XL) 150 MG 24 hr tablet Take 150 mg by mouth every morning.  . busPIRone (BUSPAR) 5 MG tablet Take 5 mg by mouth 2 (two) times daily.  . candesartan (ATACAND) 32 MG tablet Take 32 mg by mouth at bedtime.  Marland Kitchen  chlorpheniramine-HYDROcodone (TUSSIONEX) 10-8 MG/5ML SUER hydrocodone 10 mg-chlorpheniramine 8 mg/5 mL oral susp extend.rel 12hr  TAKE 5 ML AS NEEDED AT BEDTIME ORALLY 10 DAYS  . ciprofloxacin (CIPRO) 250 MG tablet ciprofloxacin 250 mg tablet  TAKE 1 TABLET BY MOUTH TWICE A DAY  . citalopram (CELEXA) 10 MG tablet Take 10 mg by mouth daily.  . cloNIDine (CATAPRES - DOSED IN MG/24 HR) 0.2 mg/24hr patch APPLY 1 PATCH TOPICALLY TO  SKIN ONCE A WEEK  . doxazosin (CARDURA) 8 MG tablet TAKE 1/2 TABLET BY MOUTH ONCE A DAY  . doxycycline (MONODOX) 100 MG capsule doxycycline monohydrate 100 mg capsule  TAKE ONE CAPSULE BY MOUTH TWICE A DAY  . escitalopram (LEXAPRO) 10 MG tablet escitalopram 10 mg  tablet  1/2 TABLET X 2WEEK THEN 1 TABLET ONCE A DAY ORALLY 30 DAY(S)  . fluconazole (DIFLUCAN) 150 MG tablet fluconazole 150 mg tablet  TAKE FIRST TABLET ON DAY 1 FOLLOWED BY SECOND TABLET IN 3-4- DAYS = FOR YEAST INFECTION  . fluticasone (FLONASE) 50 MCG/ACT nasal spray fluticasone propionate 50 mcg/actuation nasal spray,suspension  2 SPRAY IN EACH NOSTRIL ONCE A DAY  . gabapentin (NEURONTIN) 300 MG capsule Take 300 mg by mouth 3 (three) times daily.  Marland Kitchen glucose blood test strip Accu-Chek Aviva Plus test strips  USE AS DIRECTED. TESTING FREQUENCY: 5X/DAILY.  . hydrochlorothiazide (HYDRODIURIL) 25 MG tablet TAKE 2 TABLETS BY MOUTH EVERY DAY  . HYDROcodone-acetaminophen (NORCO) 7.5-325 MG tablet hydrocodone 7.5 mg-acetaminophen 325 mg tablet  TAKE 1 TABLET BY MOUTH EVERY DAY AS NEEDED FOR PAIN  . hydrocortisone (ANUSOL-HC) 25 MG suppository hydrocortisone acetate 25 mg rectal suppository  INSERT 1 SUPPOSITORY RECTALLYTWICE FOR 10 DAYS  . hydrocortisone 2.5 % cream hydrocortisone 2.5 % topical cream  1 APPLICATION APPLY ON THE SKIN TWICE A DAY APPLY THIN LAYER TO SKIN TWICE DAILY  . insulin glargine (LANTUS) 100 UNIT/ML injection Inject into the skin.  Marland Kitchen insulin lispro (HUMALOG KWIKPEN) 100 UNIT/ML KwikPen INJECT SUBCUTANEOUSLY 15  UNITS 3 TIMES DAILY BEFORE  EACH MEAL  . Insulin Pen Needle (B-D ULTRAFINE III SHORT PEN) 31G X 8 MM MISC USE TO INJECT INSULIN 3  TIMES DAILY AS DIRECTED  . Insulin Syringe-Needle U-100 (BD SAFETYGLIDE INSULIN SYRINGE) 31G X 15/64" 1 ML MISC 1 Syringe by Misc.(Non-Drug; Combo Route) route 2 times daily.  Marland Kitchen ketoconazole (NIZORAL) 2 % cream APPLY TO AFFECTED AREA EVERY DAY AS NEEDED FOR REDNESS AND ITCHING  . lansoprazole (PREVACID SOLUTAB) 30 MG disintegrating tablet Prevacid SoluTab 30 mg delayed release,disintegrating tablet  TAKE 1 TABLET ONCE DAILY.  Marland Kitchen levothyroxine (SYNTHROID) 200 MCG tablet TAKE 1 TABLET (200 MCG TOTAL) BY MOUTH DAILY BEFORE BREAKFAST. (DAW)  .  metFORMIN (GLUCOPHAGE-XR) 500 MG 24 hr tablet metformin ER 500 mg tablet,extended release 24 hr  TAKE ONE TABLET BY MOUTH TWICE A DAY WITH A MEAL  . metoprolol succinate (TOPROL-XL) 50 MG 24 hr tablet Take 1 tablet by mouth daily.  Marland Kitchen neomycin-polymyxin-hydrocortisone (CORTISPORIN) 3.5-10000-1 OTIC suspension neomycin-polymyxin-hydrocort 3.5 mg-10,000 unit/mL-1 % ear drops,susp  USE 2 TO 5 DROPS TWICE A DAY INTO EACH AFFECTED EAR FOR 10 DAYS  . ondansetron (ZOFRAN) 4 MG tablet ondansetron HCl 4 mg tablet  TAKE 1 TABLET BY MOUTH AS NEEDED FOR NAUSEA  . ondansetron (ZOFRAN-ODT) 4 MG disintegrating tablet ondansetron 4 mg disintegrating tablet  1 TABLET AS NEEDED FOR NAUSEA EVERY 8 HOURS ORALLY  . oxyCODONE-acetaminophen (PERCOCET/ROXICET) 5-325 MG tablet oxycodone-acetaminophen 5 mg-325 mg tablet  TAKE 1/2 TABLET BY MOUTH  EVERY 6 HOURS AS NEEDED FOR SEVERE PAIN  . pantoprazole (PROTONIX) 20 MG tablet pantoprazole 20 mg tablet,delayed release  TAKE 1 TABLET BY MOUTH 2 TIMES A DAY 90 DAYS  . pantoprazole (PROTONIX) 40 MG tablet pantoprazole 40 mg tablet,delayed release  TAKE 1 TABLET BY MOUTH TWICE A DAY FOR 1 MONTH THEN ONCE DAILY  . potassium chloride SA (KLOR-CON M20) 20 MEQ tablet Klor-Con M20 mEq tablet,extended release  TAKE 1 TABLET BY MOUTH EVERY DAY  . QUEtiapine (SEROQUEL) 25 MG tablet quetiapine 25 mg tablet  TAKE 1 TABLET BY MOUTH AT BEDTIME FOR SLEEP/DEPRESSION  . sertraline (ZOLOFT) 25 MG tablet sertraline 25 mg tablet  TAKE 1 TABLET EVERY DAY  . triamcinolone cream (KENALOG) 0.1 % PLEASE SEE ATTACHED FOR DETAILED DIRECTIONS  . UNABLE TO FIND ergocalciferol (vitamin D2) 50,000 unit capsule  . UNABLE TO FIND Accu-Chek FastClix Lancing Device  . UNABLE TO FIND Accu-Chek [employer]  . UNABLE TO FIND USE TO TEST BLOOD SUGARS AT LEAST 5 TIMES A DAY (E11.65)  . UNABLE TO FIND Accu-Chek Fastclix Lancet Drum  USE TO TEST BLOOD SUGARS AT LEAST 5 TIMES A DAY (E11.65)  . UNABLE TO  FIND ergocalciferol (vitamin D2) 1,250 mcg (50,000 unit) capsule  TAKE 1 CAPSULE (50,000 UNITS TOTAL) BY MOUTH ONCE A WEEK. TAKE 1 CAPSULE ONCE A WEEK  . UNABLE TO FIND 1 APPLICATION TWICE A DAY RECTAL 30 DAY(S)  . valACYclovir (VALTREX) 500 MG tablet ONE TWICE A DAY FOR 7 DAYS PRN FOR HERPES OUTBREAK  . valACYclovir (VALTREX) 500 MG tablet valacyclovir 500 mg tablet  ONE TWICE A DAY FOR 7 DAYS = FOR HERPES OUTBREAK  . venlafaxine XR (EFFEXOR-XR) 75 MG 24 hr capsule TAKE 1 CAPSULE BY MOUTH EVERY DAY WITH FOOD  . Vitamin D, Ergocalciferol, (DRISDOL) 50000 UNITS CAPS Take 50,000 Units by mouth every 7 (seven) days. Thursdays   No current facility-administered medications for this visit. (Other)      REVIEW OF SYSTEMS:    ALLERGIES Allergies  Allergen Reactions  . Esomeprazole Magnesium Shortness Of Breath  . [place] Other (See Comments)    Lips tingling  . Augmentin [Amoxicillin-Pot Clavulanate]     Has patient had a PCN reaction causing immediate rash, facial/tongue/throat swelling, SOB or lightheadedness with hypotension: Unknown Has patient had a PCN reaction causing severe rash involving mucus membranes or skin necrosis: Unknown Has patient had a PCN reaction that required hospitalization:Unknown Has patient had a PCN reaction occurring within the last 10 years:Unknown If all of the above answers are "NO", then may proceed with Cephalosporin use.   . Avelox [Moxifloxacin Hcl In Nacl] Nausea Only  . Azithromycin Other (See Comments)  . Benzonatate Nausea And Vomiting  . Codeine Other (See Comments)  . Demerol [Meperidine] Other (See Comments)    Hallucination, pass out  . Duloxetine Other (See Comments)    CNS effects  . Erythromycin Base Other (See Comments)  . Exenatide Other (See Comments)    Reflux  . Keflex [Cephalexin] Nausea Only  . Levofloxacin Nausea Only  . Lisinopril Cough  . Metformin Diarrhea  . Nifedipine Other (See Comments)    Cardiac Awareness  .  Nitrofurantoin Macrocrystal Other (See Comments)    Not listed  . Paroxetine Hcl Other (See Comments)    constipation  . Rosiglitazone Nausea Only  . Sitagliptin Nausea Only and Other (See Comments)    Loose stools  . Spironolactone Nausea Only    "lips pulling"  .  Sulfamethoxazole Other (See Comments)    Not listed    PAST MEDICAL HISTORY Past Medical History:  Diagnosis Date  . Anxiety   . Arthritis   . Asthma   . Diabetes mellitus   . GERD (gastroesophageal reflux disease)   . Hyperlipidemia   . Hypertension   . Hypothyroid   . Sleep apnea    use CPAP nightly   Past Surgical History:  Procedure Laterality Date  . ABDOMINAL HYSTERECTOMY    . CHOLECYSTECTOMY    . COLONOSCOPY WITH PROPOFOL N/A 08/29/2012   Procedure: COLONOSCOPY WITH PROPOFOL;  Surgeon: Garlan Fair, MD;  Location: [facility];  Service: Endoscopy;  Laterality: N/A;  . EYE SURGERY    . IR GENERIC HISTORICAL  01/14/2016   IR RADIOLOGIST EVAL & MGMT 01/14/2016 [facility] INTERV RAD    FAMILY HISTORY Family History  Adopted: Yes    SOCIAL HISTORY Social History   Tobacco Use  . Smoking status: Never Smoker  . Smokeless tobacco: Never Used  Substance Use Topics  . Alcohol use: No  . Drug use: No         OPHTHALMIC EXAM: Base Eye Exam    Visual Acuity (ETDRS)      Right Left   Dist cc 20/20 -2 20/200 -1   Dist ph cc  NI   Correction: Glasses       Tonometry (Tonopen, 9:29 AM)      Right Left   Pressure 12 18       Pupils      Dark Light Shape React APD   Right 4 3 Round Brisk None   Left 4 3 Round Brisk +1       Visual Fields (Counting fingers)      Left Right     Full   Restrictions Total superior temporal, inferior temporal, superior nasal, inferior nasal deficiencies        Extraocular Movement      Right Left    Full Full       Neuro/Psych    Oriented x3: Yes   Mood/Affect: Normal       Dilation    Both eyes: 1.0% Mydriacyl, 2.5% Phenylephrine @ 9:32 AM         Slit Lamp and Fundus Exam    External Exam      Right Left   External Normal Normal       Slit Lamp Exam      Right Left   Lids/Lashes Normal Normal   Conjunctiva/Sclera White and quiet White and quiet   Cornea Clear Clear   Anterior Chamber Deep and quiet Deep and quiet   Iris Round and reactive Round and reactive   Lens Posterior chamber intraocular lens Posterior chamber intraocular lens   Anterior Vitreous Normal Normal       Fundus Exam      Right Left   Posterior Vitreous Normal Normal   Disc Normal 2+ Optic disc atrophy, 2+ Pallor   C/D Ratio .1 0.6   Macula Focal laser scars Focal laser scars   Vessels PDR-quiet PDR-quiet   Periphery clear, good prp 360 clear, good prp 360          IMAGING AND PROCEDURES  Imaging and Procedures for 10/02/19  Color Fundus Photography Optos - OU - Both Eyes       Right Eye Progression has been stable. Disc findings include normal observations.   Left Eye Progression has been stable. Disc findings include pallor, increased  cup to disc ratio, thinning of rim.   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  OS, optic nerve atrophy, good focal laser photocoagulation, quiescent PDR                ASSESSMENT/PLAN:  No problem-specific Assessment & Plan notes found for this encounter.      ICD-10-CM   1. Controlled type 2 diabetes mellitus with stable proliferative retinopathy of both eyes, with long-term current use of insulin (HCC)  Z61.0960 Color Fundus Photography Optos - OU - Both Eyes   Z79.4   2. Right epiretinal membrane  H35.371   3. Early stage nonexudative age-related macular degeneration of left eye  H35.3121   4. 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 Posterior vitreous detachment of right eye  H43.811     1.  OD, quiescent PDR, clear media.  Macular function is unchanged  2.  OS with optic atrophy, stable, accounts for the acuity with diffuse macular  atrophy.  PDR OS diffusely quiescent  3.  Ophthalmic Meds Ordered this visit:  No orders of the defined types were placed in this encounter.      Return in about 9 months (around 07/01/2020) for DILATE OU, COLOR FP, OCT.  There are no Patient Instructions on file for this visit.   Explained the diagnoses, plan, and follow up with the patient and they expressed understanding.  Patient expressed understanding of the importance of proper follow up care.   Clent Demark Edye Hainline M.D. Diseases & Surgery of the Retina and Vitreous [facility] [place] 10/02/19     Abbreviations: M myopia (nearsighted); A astigmatism; H hyperopia (farsighted); P presbyopia; Mrx spectacle prescription;  CTL contact lenses; OD right eye; OS left eye; OU both eyes  XT exotropia; ET esotropia; PEK punctate epithelial keratitis; PEE punctate epithelial erosions; DES dry eye syndrome; MGD meibomian gland dysfunction; ATs artificial tears; PFAT's preservative free artificial tears; [place] nuclear sclerotic cataract; PSC posterior subcapsular cataract; ERM epi-retinal membrane; PVD posterior vitreous detachment; RD retinal detachment; DM diabetes mellitus; DR diabetic retinopathy; NPDR non-proliferative diabetic retinopathy; PDR proliferative diabetic retinopathy; CSME clinically significant macular edema; DME diabetic macular edema; dbh dot blot hemorrhages; CWS cotton wool spot; POAG primary open angle glaucoma; C/D cup-to-disc ratio; HVF humphrey visual field; GVF goldmann visual field; OCT optical coherence tomography; IOP intraocular pressure; BRVO Branch retinal vein occlusion; CRVO central retinal vein occlusion; CRAO central retinal artery occlusion; BRAO branch retinal artery occlusion; RT retinal tear; SB scleral buckle; PPV pars plana vitrectomy; VH Vitreous hemorrhage; PRP panretinal laser photocoagulation; IVK intravitreal kenalog; VMT vitreomacular traction; MH Macular hole;  NVD neovascularization of the disc;  NVE neovascularization elsewhere; AREDS age related eye disease study; ARMD age related macular degeneration; POAG primary open angle glaucoma; EBMD epithelial/anterior basement membrane dystrophy; ACIOL anterior chamber intraocular lens; IOL intraocular lens; PCIOL posterior chamber intraocular lens; Phaco/IOL phacoemulsification with intraocular lens placement; [place] photorefractive keratectomy; LASIK laser assisted in situ keratomileusis; HTN hypertension; DM diabetes mellitus; COPD chronic obstructive pulmonary disease

## 2019-10-17 ENCOUNTER — Ambulatory Visit (INDEPENDENT_AMBULATORY_CARE_PROVIDER_SITE_OTHER): Payer: Medicare Other | Admitting: Podiatry

## 2019-10-17 ENCOUNTER — Other Ambulatory Visit: Payer: Self-pay

## 2019-10-17 ENCOUNTER — Encounter: Payer: Self-pay | Admitting: Podiatry

## 2019-10-17 DIAGNOSIS — M79675 Pain in left toe(s): Secondary | ICD-10-CM | POA: Diagnosis not present

## 2019-10-17 DIAGNOSIS — E119 Type 2 diabetes mellitus without complications: Secondary | ICD-10-CM

## 2019-10-17 DIAGNOSIS — N183 Chronic kidney disease, stage 3 unspecified: Secondary | ICD-10-CM

## 2019-10-17 DIAGNOSIS — B351 Tinea unguium: Secondary | ICD-10-CM

## 2019-10-17 DIAGNOSIS — M79674 Pain in right toe(s): Secondary | ICD-10-CM

## 2019-10-17 NOTE — Progress Notes (Signed)
This patient returns to my office for at risk foot care.  This patient requires this care by a professional since this patient will be at risk due to having diabetes and chronic kidney disease.  This patient is unable to cut nails herself since the patient cannot reach her nails .These nails are painful walking and wearing shoes.  This patient presents for at risk foot care today.  General Appearance  Alert, conversant and in no acute stress.  Vascular  Dorsalis pedis and posterior tibial  pulses are palpable  bilaterally.  Capillary return is within normal limits  bilaterally. Temperature is within normal limits  bilaterally.  Neurologic  Senn-Weinstein monofilament wire test within normal limits  bilaterally. Muscle power within normal limits bilaterally.  Nails Thick disfigured discolored nails with subungual debris  from hallux to fifth toes bilaterally. Pincer nails . No evidence of bacterial infection or drainage bilaterally.  Orthopedic  No limitations of motion  feet .  No crepitus or effusions noted.  No bony pathology or digital deformities noted.  Skin  normotropic skin with no porokeratosis noted bilaterally.  No signs of infections or ulcers noted.     Onychomycosis  Pain in right toes  Pain in left toes  Consent was obtained for treatment procedures.   Mechanical debridement of nails 1-5  bilaterally performed with a nail nipper.  Filed with dremel without incident.    Return office visit   10 weeks                 Told patient to return for periodic foot care and evaluation due to potential at risk complications.   Gavin Telford DPM  

## 2020-01-02 ENCOUNTER — Encounter: Payer: Self-pay | Admitting: Podiatry

## 2020-01-02 ENCOUNTER — Other Ambulatory Visit: Payer: Self-pay

## 2020-01-02 ENCOUNTER — Ambulatory Visit (INDEPENDENT_AMBULATORY_CARE_PROVIDER_SITE_OTHER): Payer: Medicare Other | Admitting: Podiatry

## 2020-01-02 DIAGNOSIS — B351 Tinea unguium: Secondary | ICD-10-CM | POA: Diagnosis not present

## 2020-01-02 DIAGNOSIS — M79675 Pain in left toe(s): Secondary | ICD-10-CM | POA: Diagnosis not present

## 2020-01-02 DIAGNOSIS — N183 Chronic kidney disease, stage 3 unspecified: Secondary | ICD-10-CM

## 2020-01-02 DIAGNOSIS — E119 Type 2 diabetes mellitus without complications: Secondary | ICD-10-CM | POA: Diagnosis not present

## 2020-01-02 DIAGNOSIS — M79674 Pain in right toe(s): Secondary | ICD-10-CM

## 2020-01-02 NOTE — Progress Notes (Signed)
This patient returns to my office for at risk foot care.  This patient requires this care by a professional since this patient will be at risk due to having diabetes and chronic kidney disease.  This patient is unable to cut nails herself since the patient cannot reach her nails .These nails are painful walking and wearing shoes.  This patient presents for at risk foot care today.  General Appearance  Alert, conversant and in no acute stress.  Vascular  Dorsalis pedis and posterior tibial  pulses are palpable  bilaterally.  Capillary return is within normal limits  bilaterally. Temperature is within normal limits  bilaterally.  Neurologic  Senn-Weinstein monofilament wire test within normal limits  bilaterally. Muscle power within normal limits bilaterally.  Nails Thick disfigured discolored nails with subungual debris  from hallux to fifth toes bilaterally. Pincer nails . No evidence of bacterial infection or drainage bilaterally.  Orthopedic  No limitations of motion  feet .  No crepitus or effusions noted.  No bony pathology or digital deformities noted.  Skin  normotropic skin with no porokeratosis noted bilaterally.  No signs of infections or ulcers noted.     Onychomycosis  Pain in right toes  Pain in left toes  Consent was obtained for treatment procedures.   Mechanical debridement of nails 1-5  bilaterally performed with a nail nipper.  Filed with dremel without incident.    Return office visit   10 weeks                 Told patient to return for periodic foot care and evaluation due to potential at risk complications.   Eleazar Kimmey DPM  

## 2020-02-26 ENCOUNTER — Other Ambulatory Visit: Payer: Self-pay | Admitting: Nephrology

## 2020-02-26 DIAGNOSIS — N2889 Other specified disorders of kidney and ureter: Secondary | ICD-10-CM

## 2020-03-20 ENCOUNTER — Ambulatory Visit
Admission: RE | Admit: 2020-03-20 | Discharge: 2020-03-20 | Disposition: A | Discharge status: Home / Self Care | Payer: Medicare Other | Source: Ambulatory Visit | Attending: Nephrology | Admitting: Nephrology

## 2020-03-20 ENCOUNTER — Other Ambulatory Visit: Payer: Self-pay

## 2020-03-20 DIAGNOSIS — N2889 Other specified disorders of kidney and ureter: Secondary | ICD-10-CM

## 2020-03-20 MED ORDER — GADOBENATE DIMEGLUMINE 529 MG/ML IV SOLN
20.0000 mL | Freq: Once | INTRAVENOUS | Status: AC | PRN
  Administered 2020-03-20: 20 mL via INTRAVENOUS

## 2020-04-09 ENCOUNTER — Ambulatory Visit: Payer: Medicare Other | Admitting: Podiatry

## 2020-05-13 ENCOUNTER — Encounter: Payer: Self-pay | Admitting: Podiatry

## 2020-05-13 ENCOUNTER — Other Ambulatory Visit: Payer: Self-pay

## 2020-05-13 ENCOUNTER — Ambulatory Visit (INDEPENDENT_AMBULATORY_CARE_PROVIDER_SITE_OTHER): Payer: Medicare Other | Admitting: Podiatry

## 2020-05-13 DIAGNOSIS — E119 Type 2 diabetes mellitus without complications: Secondary | ICD-10-CM

## 2020-05-13 DIAGNOSIS — N183 Chronic kidney disease, stage 3 unspecified: Secondary | ICD-10-CM | POA: Diagnosis not present

## 2020-05-13 DIAGNOSIS — M79674 Pain in right toe(s): Secondary | ICD-10-CM

## 2020-05-13 DIAGNOSIS — M79675 Pain in left toe(s): Secondary | ICD-10-CM

## 2020-05-13 DIAGNOSIS — B351 Tinea unguium: Secondary | ICD-10-CM

## 2020-05-13 NOTE — Progress Notes (Signed)
This patient returns to my office for at risk foot care.  This patient requires this care by a professional since this patient will be at risk due to having diabetes and chronic kidney disease.  This patient is unable to cut nails herself since the patient cannot reach her nails .These nails are painful walking and wearing shoes.  This patient presents for at risk foot care today.  General Appearance  Alert, conversant and in no acute stress.  Vascular  Dorsalis pedis and posterior tibial  pulses are palpable  bilaterally.  Capillary return is within normal limits  bilaterally. Temperature is within normal limits  bilaterally.  Neurologic  Senn-Weinstein monofilament wire test within normal limits  bilaterally. Muscle power within normal limits bilaterally.  Nails Thick disfigured discolored nails with subungual debris  from hallux to fifth toes bilaterally. Pincer nails . No evidence of bacterial infection or drainage bilaterally.  Orthopedic  No limitations of motion  feet .  No crepitus or effusions noted.  No bony pathology or digital deformities noted.  Skin  normotropic skin with no porokeratosis noted bilaterally.  No signs of infections or ulcers noted.     Onychomycosis  Pain in right toes  Pain in left toes  Consent was obtained for treatment procedures.   Mechanical debridement of nails 1-5  bilaterally performed with a nail nipper.  Filed with dremel without incident.    Return office visit   10 weeks                 Told patient to return for periodic foot care and evaluation due to potential at risk complications.   Napolean Sia DPM  

## 2020-07-01 ENCOUNTER — Encounter (INDEPENDENT_AMBULATORY_CARE_PROVIDER_SITE_OTHER): Payer: Medicare Other | Admitting: Ophthalmology

## 2020-07-07 ENCOUNTER — Encounter (INDEPENDENT_AMBULATORY_CARE_PROVIDER_SITE_OTHER): Payer: Self-pay | Admitting: Ophthalmology

## 2020-07-07 ENCOUNTER — Ambulatory Visit (INDEPENDENT_AMBULATORY_CARE_PROVIDER_SITE_OTHER): Payer: Medicare Other | Admitting: Ophthalmology

## 2020-07-07 ENCOUNTER — Other Ambulatory Visit: Payer: Self-pay

## 2020-07-07 DIAGNOSIS — H43811 Vitreous degeneration, right eye: Secondary | ICD-10-CM

## 2020-07-07 DIAGNOSIS — Z794 Long term (current) use of insulin: Secondary | ICD-10-CM

## 2020-07-07 DIAGNOSIS — E113553 Type 2 diabetes mellitus with stable proliferative diabetic retinopathy, bilateral: Secondary | ICD-10-CM | POA: Diagnosis not present

## 2020-07-07 DIAGNOSIS — H35371 Puckering of macula, right eye: Secondary | ICD-10-CM | POA: Diagnosis not present

## 2020-07-07 NOTE — Progress Notes (Signed)
07/07/2020     CHIEF COMPLAINT Patient presents for Retina Follow Up (9 Mo F/U OU//Pt c/o redness OS after sleeping on eye, but pt sts she put "salt water" on eye, and it helped. Pt sts [facility] OU is stable./A1c: 7.2, 3 mo ago/LBS: 169 this AM)   HISTORY OF PRESENT ILLNESS: [name] is a 75 y.o. female who presents to the clinic today for:   HPI     Retina Follow Up           Diagnosis: Diabetic Retinopathy   Laterality: both eyes   Onset: 9 months ago   Severity: mild   Duration: 9 months   Course: stable   Comments: 9 Mo F/U OU  Pt c/o redness OS after sleeping on eye, but pt sts she put "salt water" on eye, and it helped. Pt sts VA OU is stable. A1c: 7.2, 3 mo ago LBS: 169 this AM       Last edited by Milly Jakob, [place] on 07/07/2020  9:24 AM.      Referring physician: Wenda Low, MD [address] [place],  [place] [address]  HISTORICAL INFORMATION:   Selected notes from the MEDICAL RECORD NUMBER    Lab Results  Component Value Date   HGBA1C 7.9 (H) 09/23/2015     CURRENT MEDICATIONS: Current Outpatient Medications (Ophthalmic Drugs)  Medication Sig   bacitracin ophthalmic ointment bacitracin 500 unit/gram eye ointment  APPLY A SMALL AMOUNT TO BOTH EYES NIGHTLY, START 1 WEEK PRIOR TO SURGERY   brimonidine (ALPHAGAN) 0.2 % ophthalmic solution brimonidine 0.2 % eye drops  INSTILL 1 DROP IN RIGHT EYE TWICE A DAY, START 48 HOURS PRIOR TO SURGERY (Patient not taking: Reported on 07/07/2020)   cyclopentolate (CYCLODRYL,CYCLOGYL) 2 % ophthalmic solution cyclopentolate 2 % eye drops  INSTILL 1 DROP IN RIGHT EYE THREE TIMES A DAY, START 24 HOURS PRIOR TO SURGERY (Patient not taking: Reported on 07/07/2020)   ketorolac (ACULAR) 0.5 % ophthalmic solution ketorolac 0.5 % eye drops  INSTILL 1 DROP IN RIGHT EYE FOUR TIMES A DAY START 1 WEEK PRIOR TO SURGERY (Patient not taking: Reported on 07/07/2020)   ofloxacin (OCUFLOX) 0.3 % ophthalmic solution  ofloxacin 0.3 % eye drops  INSTILL 1 DROP IN BOTH EYES FOUR TIMES A DAY START 72 HOURS PRIOR TO SURGERY (Patient not taking: Reported on 07/07/2020)   prednisoLONE acetate (PRED FORTE) 1 % ophthalmic suspension prednisolone acetate 1 % eye drops,suspension  INSTILL 1 DROP IN RIGHT EYE FOUR TIMES A DAY START AFTER SURGERY   No current facility-administered medications for this visit. (Ophthalmic Drugs)   Current Outpatient Medications (Other)  Medication Sig   Accu-Chek FastClix Lancets MISC USE TO TEST BLOOD SUGARS AT LEAST 5 TIMES A DAY (E11.65)   albuterol (VENTOLIN HFA) 108 (90 Base) MCG/ACT inhaler Ventolin HFA 90 mcg/actuation aerosol inhaler  2 PUFFS AS NEEDED EVERY 6 HRS INHALATION 30 DAYS   ALPRAZolam (XANAX) 0.25 MG tablet Take 0.125-0.25 mg by mouth 3 (three) times daily as needed for anxiety.    amLODipine (NORVASC) 5 MG tablet Take 5 mg by mouth at bedtime.   aspirin EC 81 MG tablet Take 81 mg by mouth daily.   atorvastatin (LIPITOR) 40 MG tablet Take 40 mg by mouth at bedtime.   benzonatate (TESSALON) 100 MG capsule Take 1 capsule (100 mg total) by mouth every 8 (eight) hours.   buPROPion (WELLBUTRIN XL) 150 MG 24 hr tablet Take 150 mg by mouth  every morning.   busPIRone (BUSPAR) 5 MG tablet Take 5 mg by mouth 2 (two) times daily.   candesartan (ATACAND) 32 MG tablet Take 32 mg by mouth at bedtime.   chlorpheniramine-HYDROcodone (TUSSIONEX) 10-8 MG/5ML SUER hydrocodone 10 mg-chlorpheniramine 8 mg/5 mL oral susp extend.rel 12hr  TAKE 5 ML AS NEEDED AT BEDTIME ORALLY 10 DAYS   ciprofloxacin (CIPRO) 250 MG tablet ciprofloxacin 250 mg tablet  TAKE 1 TABLET BY MOUTH TWICE A DAY   citalopram (CELEXA) 10 MG tablet Take 10 mg by mouth daily.   cloNIDine (CATAPRES - DOSED IN MG/24 HR) 0.2 mg/24hr patch APPLY 1 PATCH TOPICALLY TO  SKIN ONCE A WEEK   doxazosin (CARDURA) 8 MG tablet TAKE 1/2 TABLET BY MOUTH ONCE A DAY   doxycycline (MONODOX) 100 MG capsule doxycycline monohydrate 100 mg  capsule  TAKE ONE CAPSULE BY MOUTH TWICE A DAY   escitalopram (LEXAPRO) 10 MG tablet escitalopram 10 mg tablet  1/2 TABLET X 2WEEK THEN 1 TABLET ONCE A DAY ORALLY 30 DAY(S)   fluconazole (DIFLUCAN) 150 MG tablet fluconazole 150 mg tablet  TAKE FIRST TABLET ON DAY 1 FOLLOWED BY SECOND TABLET IN 3-4- DAYS = FOR YEAST INFECTION   fluticasone (FLONASE) 50 MCG/ACT nasal spray fluticasone propionate 50 mcg/actuation nasal spray,suspension  2 SPRAY IN EACH NOSTRIL ONCE A DAY   gabapentin (NEURONTIN) 300 MG capsule Take 300 mg by mouth 3 (three) times daily.   glucose blood test strip Accu-Chek Aviva Plus test strips  USE AS DIRECTED. TESTING FREQUENCY: 5X/DAILY.   hydrochlorothiazide (HYDRODIURIL) 25 MG tablet TAKE 2 TABLETS BY MOUTH EVERY DAY   HYDROcodone-acetaminophen (NORCO) 7.5-325 MG tablet hydrocodone 7.5 mg-acetaminophen 325 mg tablet  TAKE 1 TABLET BY MOUTH EVERY DAY AS NEEDED FOR PAIN   hydrocortisone (ANUSOL-HC) 25 MG suppository hydrocortisone acetate 25 mg rectal suppository  INSERT 1 SUPPOSITORY RECTALLYTWICE FOR 10 DAYS   hydrocortisone 2.5 % cream hydrocortisone 2.5 % topical cream  1 APPLICATION APPLY ON THE SKIN TWICE A DAY APPLY THIN LAYER TO SKIN TWICE DAILY   insulin glargine (LANTUS) 100 UNIT/ML injection Inject into the skin.   insulin lispro (HUMALOG) 100 UNIT/ML KwikPen INJECT SUBCUTANEOUSLY 15  UNITS 3 TIMES DAILY BEFORE  EACH MEAL   Insulin Pen Needle (B-D ULTRAFINE III SHORT PEN) 31G X 8 MM MISC USE TO INJECT INSULIN 3  TIMES DAILY AS DIRECTED   Insulin Syringe-Needle U-100 (BD SAFETYGLIDE INSULIN SYRINGE) 31G X 15/64" 1 ML MISC 1 Syringe by Misc.(Non-Drug; Combo Route) route 2 times daily.   ketoconazole (NIZORAL) 2 % cream APPLY TO AFFECTED AREA EVERY DAY AS NEEDED FOR REDNESS AND ITCHING   lansoprazole (PREVACID SOLUTAB) 30 MG disintegrating tablet Prevacid SoluTab 30 mg delayed release,disintegrating tablet  TAKE 1 TABLET ONCE DAILY.   levothyroxine (SYNTHROID) 200  MCG tablet TAKE 1 TABLET (200 MCG TOTAL) BY MOUTH DAILY BEFORE BREAKFAST. (DAW)   metFORMIN (GLUCOPHAGE-XR) 500 MG 24 hr tablet metformin ER 500 mg tablet,extended release 24 hr  TAKE ONE TABLET BY MOUTH TWICE A DAY WITH A MEAL   metoprolol succinate (TOPROL-XL) 50 MG 24 hr tablet Take 1 tablet by mouth daily.   neomycin-polymyxin-hydrocortisone (CORTISPORIN) 3.5-10000-1 OTIC suspension neomycin-polymyxin-hydrocort 3.5 mg-10,000 unit/mL-1 % ear drops,susp  USE 2 TO 5 DROPS TWICE A DAY INTO EACH AFFECTED EAR FOR 10 DAYS   ondansetron (ZOFRAN) 4 MG tablet ondansetron HCl 4 mg tablet  TAKE 1 TABLET BY MOUTH AS NEEDED FOR NAUSEA   ondansetron (ZOFRAN-ODT) 4 MG disintegrating tablet ondansetron  4 mg disintegrating tablet  1 TABLET AS NEEDED FOR NAUSEA EVERY 8 HOURS ORALLY   oxyCODONE-acetaminophen (PERCOCET/ROXICET) 5-325 MG tablet oxycodone-acetaminophen 5 mg-325 mg tablet  TAKE 1/2 TABLET BY MOUTH EVERY 6 HOURS AS NEEDED FOR SEVERE PAIN   pantoprazole (PROTONIX) 20 MG tablet pantoprazole 20 mg tablet,delayed release  TAKE 1 TABLET BY MOUTH 2 TIMES A DAY 90 DAYS   pantoprazole (PROTONIX) 40 MG tablet pantoprazole 40 mg tablet,delayed release  TAKE 1 TABLET BY MOUTH TWICE A DAY FOR 1 MONTH THEN ONCE DAILY   potassium chloride SA (KLOR-CON) 20 MEQ tablet Klor-Con M20 mEq tablet,extended release  TAKE 1 TABLET BY MOUTH EVERY DAY   QUEtiapine (SEROQUEL) 25 MG tablet quetiapine 25 mg tablet  TAKE 1 TABLET BY MOUTH AT BEDTIME FOR SLEEP/DEPRESSION   sertraline (ZOLOFT) 25 MG tablet sertraline 25 mg tablet  TAKE 1 TABLET EVERY DAY   triamcinolone cream (KENALOG) 0.1 % PLEASE SEE ATTACHED FOR DETAILED DIRECTIONS   UNABLE TO FIND ergocalciferol (vitamin D2) 50,000 unit capsule   UNABLE TO FIND Accu-Chek FastClix Lancing Device   UNABLE TO FIND Accu-Chek Fastclix Lancet Drum   UNABLE TO FIND USE TO TEST BLOOD SUGARS AT LEAST 5 TIMES A DAY (E11.65)   UNABLE TO FIND Accu-Chek Fastclix Lancet Drum  USE TO  TEST BLOOD SUGARS AT LEAST 5 TIMES A DAY (E11.65)   UNABLE TO FIND ergocalciferol (vitamin D2) 1,250 mcg (50,000 unit) capsule  TAKE 1 CAPSULE (50,000 UNITS TOTAL) BY MOUTH ONCE A WEEK. TAKE 1 CAPSULE ONCE A WEEK   UNABLE TO FIND 1 APPLICATION TWICE A DAY RECTAL 30 DAY(S)   valACYclovir (VALTREX) 500 MG tablet ONE TWICE A DAY FOR 7 DAYS PRN FOR HERPES OUTBREAK   valACYclovir (VALTREX) 500 MG tablet valacyclovir 500 mg tablet  ONE TWICE A DAY FOR 7 DAYS = FOR HERPES OUTBREAK   venlafaxine XR (EFFEXOR-XR) 75 MG 24 hr capsule TAKE 1 CAPSULE BY MOUTH EVERY DAY WITH FOOD   Vitamin D, Ergocalciferol, (DRISDOL) 50000 UNITS CAPS Take 50,000 Units by mouth every 7 (seven) days. Thursdays   No current facility-administered medications for this visit. (Other)      REVIEW OF SYSTEMS:    ALLERGIES Allergies  Allergen Reactions   Esomeprazole Magnesium Shortness Of Breath   Aliskiren Other (See Comments)    Lips tingling   Augmentin [Amoxicillin-Pot Clavulanate]     Has patient had a PCN reaction causing immediate rash, facial/tongue/throat swelling, SOB or lightheadedness with hypotension: Unknown Has patient had a PCN reaction causing severe rash involving mucus membranes or skin necrosis: Unknown Has patient had a PCN reaction that required hospitalization:Unknown Has patient had a PCN reaction occurring within the last 10 years:Unknown If all of the above answers are "NO", then may proceed with Cephalosporin use.    Avelox [Moxifloxacin Hcl In Nacl] Nausea Only   Azithromycin Other (See Comments)   Benzonatate Nausea And Vomiting   Codeine Other (See Comments)   Demerol [Meperidine] Other (See Comments)    Hallucination, pass out   Duloxetine Other (See Comments)    CNS effects   Erythromycin Base Other (See Comments)   Exenatide Other (See Comments)    Reflux   Keflex [Cephalexin] Nausea Only   Levofloxacin Nausea Only   Lisinopril Cough   Metformin Diarrhea   Nifedipine Other  (See Comments)    Cardiac Awareness   Nitrofurantoin Macrocrystal Other (See Comments)    Not listed   Paroxetine Hcl Other (See Comments)  constipation   Rosiglitazone Nausea Only   Sitagliptin Nausea Only and Other (See Comments)    Loose stools   Spironolactone Nausea Only    "lips pulling"   Sulfamethoxazole Other (See Comments)    Not listed    PAST MEDICAL HISTORY Past Medical History:  Diagnosis Date   Anxiety    Arthritis    Asthma    Diabetes mellitus    GERD (gastroesophageal reflux disease)    Hyperlipidemia    Hypertension    Hypothyroid    Sleep apnea    use CPAP nightly   Past Surgical History:  Procedure Laterality Date   ABDOMINAL HYSTERECTOMY     CHOLECYSTECTOMY     COLONOSCOPY WITH PROPOFOL N/A 08/29/2012   Procedure: COLONOSCOPY WITH PROPOFOL;  Surgeon: Garlan Fair, MD;  Location: [facility];  Service: Endoscopy;  Laterality: N/A;   EYE SURGERY     IR GENERIC HISTORICAL  01/14/2016   IR RADIOLOGIST EVAL & MGMT 01/14/2016 [facility] INTERV RAD    FAMILY HISTORY Family History  Adopted: Yes    SOCIAL HISTORY Social History   Tobacco Use   Smoking status: Never   Smokeless tobacco: Never  Substance Use Topics   Alcohol use: No   Drug use: No         OPHTHALMIC EXAM:  Base Eye Exam     Visual Acuity (ETDRS)       Right Left   Dist cc 20/20 -1 20/400   Dist ph cc  NI    Correction: Glasses         Tonometry (Tonopen, 9:28 AM)       Right Left   Pressure 18 21         Pupils       Dark Light Shape React APD   Right 5 4 Round Brisk None   Left 5 4 Round Brisk +1         Visual Fields (Counting fingers)       Left Right     Full   Restrictions Total superior temporal, inferior temporal, superior nasal, inferior nasal deficiencies          Extraocular Movement       Right Left    Full Full         Neuro/Psych     Oriented x3: Yes   Mood/Affect: Normal         Dilation     Both eyes: 1.0%  Mydriacyl, 2.5% Phenylephrine @ 9:28 AM           Slit Lamp and Fundus Exam     External Exam       Right Left   External Normal Normal         Slit Lamp Exam       Right Left   Lids/Lashes Normal Normal   Conjunctiva/Sclera White and quiet White and quiet   Cornea Clear Clear   Anterior Chamber Deep and quiet Deep and quiet   Iris Round and reactive Round and reactive   Lens Posterior chamber intraocular lens, Open posterior capsule Posterior chamber intraocular lens, Open posterior capsule   Anterior Vitreous Normal Normal         Fundus Exam       Right Left   Posterior Vitreous Normal Normal   Disc Normal 2+ Optic disc atrophy, 2+ Pallor   C/D Ratio .1 0.6   Macula Focal laser scars Focal laser scars   Vessels PDR-quiet PDR-quiet  Periphery clear, good prp 360 clear, good prp 360            IMAGING AND PROCEDURES  Imaging and Procedures for 07/07/20  Color Fundus Photography Optos - OU - Both Eyes       Right Eye Progression has been stable. Disc findings include normal observations.   Left Eye Progression has been stable. Disc findings include pallor, increased cup to disc ratio, thinning of rim.   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  OS, optic nerve atrophy, good focal laser photocoagulation, quiescent PDR             ASSESSMENT/PLAN:  Controlled type 2 diabetes mellitus with stable proliferative retinopathy of both eyes, with long-term current use of insulin (HCC) No active disease, no active maculopathy, no progression of PDR  Posterior vitreous detachment of right eye Physiologic and stable  Right epiretinal membrane Minor nasal to fovea, no impact on acuity observe     ICD-10-CM   1. Controlled type 2 diabetes mellitus with stable proliferative retinopathy of both eyes, with long-term current use of insulin (HCC)  E11.3553 OCT, Retina - OU - Both Eyes   Z79.4 Color Fundus  Photography Optos - OU - Both Eyes    2. Posterior vitreous detachment of right eye  H43.811     3. Right epiretinal membrane  H35.371       1.  Quiet PDR OU, no active disease OU.  Stable acuity OD  2.  Epiretinal membrane OD, no impact on acuity will continue to observe.  3.  Has mild to moderate dry eye symptoms at times and left eye with gritty eye.  I encouraged her to use teardrops as needed which are over-the-counter from the pharmacy.  Ophthalmic Meds Ordered this visit:  No orders of the defined types were placed in this encounter.      Return in about 9 months (around 04/06/2021) for DILATE OU, COLOR FP, OCT.  There are no Patient Instructions on file for this visit.   Explained the diagnoses, plan, and follow up with the patient and they expressed understanding.  Patient expressed understanding of the importance of proper follow up care.   Clent Demark Shamir Sedlar M.D. Diseases & Surgery of the Retina and Vitreous [facility] [place] 07/07/20     Abbreviations: M myopia (nearsighted); A astigmatism; H hyperopia (farsighted); P presbyopia; Mrx spectacle prescription;  CTL contact lenses; OD right eye; OS left eye; OU both eyes  XT exotropia; ET esotropia; PEK punctate epithelial keratitis; PEE punctate epithelial erosions; DES dry eye syndrome; MGD meibomian gland dysfunction; ATs artificial tears; PFAT's preservative free artificial tears; [place] nuclear sclerotic cataract; PSC posterior subcapsular cataract; ERM epi-retinal membrane; PVD posterior vitreous detachment; RD retinal detachment; DM diabetes mellitus; DR diabetic retinopathy; NPDR non-proliferative diabetic retinopathy; PDR proliferative diabetic retinopathy; CSME clinically significant macular edema; DME diabetic macular edema; dbh dot blot hemorrhages; CWS cotton wool spot; POAG primary open angle glaucoma; C/D cup-to-disc ratio; HVF humphrey visual field; GVF goldmann visual field; OCT optical coherence  tomography; IOP intraocular pressure; BRVO Branch retinal vein occlusion; CRVO central retinal vein occlusion; CRAO central retinal artery occlusion; BRAO branch retinal artery occlusion; RT retinal tear; SB scleral buckle; PPV pars plana vitrectomy; VH Vitreous hemorrhage; PRP panretinal laser photocoagulation; IVK intravitreal kenalog; VMT vitreomacular traction; MH Macular hole;  NVD neovascularization of the disc; NVE neovascularization elsewhere; AREDS age related eye disease study; ARMD age related macular degeneration; POAG  primary open angle glaucoma; EBMD epithelial/anterior basement membrane dystrophy; ACIOL anterior chamber intraocular lens; IOL intraocular lens; PCIOL posterior chamber intraocular lens; Phaco/IOL phacoemulsification with intraocular lens placement; [place] photorefractive keratectomy; LASIK laser assisted in situ keratomileusis; HTN hypertension; DM diabetes mellitus; COPD chronic obstructive pulmonary disease

## 2020-07-07 NOTE — Assessment & Plan Note (Signed)
No active disease, no active maculopathy, no progression of PDR

## 2020-07-07 NOTE — Assessment & Plan Note (Signed)
Physiologic and stable 

## 2020-07-07 NOTE — Assessment & Plan Note (Signed)
Minor nasal to fovea, no impact on acuity observe

## 2020-07-08 ENCOUNTER — Encounter (INDEPENDENT_AMBULATORY_CARE_PROVIDER_SITE_OTHER): Payer: Medicare Other | Admitting: Ophthalmology

## 2020-07-17 ENCOUNTER — Encounter (INDEPENDENT_AMBULATORY_CARE_PROVIDER_SITE_OTHER): Payer: Medicare Other | Admitting: Ophthalmology

## 2020-08-13 ENCOUNTER — Other Ambulatory Visit: Payer: Self-pay

## 2020-08-13 ENCOUNTER — Ambulatory Visit (INDEPENDENT_AMBULATORY_CARE_PROVIDER_SITE_OTHER): Payer: Medicare Other | Admitting: Podiatry

## 2020-08-13 ENCOUNTER — Encounter: Payer: Self-pay | Admitting: Podiatry

## 2020-08-13 DIAGNOSIS — B351 Tinea unguium: Secondary | ICD-10-CM | POA: Diagnosis not present

## 2020-08-13 DIAGNOSIS — N183 Chronic kidney disease, stage 3 unspecified: Secondary | ICD-10-CM | POA: Diagnosis not present

## 2020-08-13 DIAGNOSIS — M79675 Pain in left toe(s): Secondary | ICD-10-CM | POA: Diagnosis not present

## 2020-08-13 DIAGNOSIS — M79674 Pain in right toe(s): Secondary | ICD-10-CM

## 2020-08-13 DIAGNOSIS — E119 Type 2 diabetes mellitus without complications: Secondary | ICD-10-CM | POA: Diagnosis not present

## 2020-08-13 NOTE — Progress Notes (Signed)
This patient returns to my office for at risk foot care.  This patient requires this care by a professional since this patient will be at risk due to having diabetes and chronic kidney disease.  This patient is unable to cut nails herself since the patient cannot reach her nails .These nails are painful walking and wearing shoes.  This patient presents for at risk foot care today.  General Appearance  Alert, conversant and in no acute stress.  Vascular  Dorsalis pedis and posterior tibial  pulses are palpable  bilaterally.  Capillary return is within normal limits  bilaterally. Temperature is within normal limits  bilaterally.  Neurologic  Senn-Weinstein monofilament wire test within normal limits  bilaterally. Muscle power within normal limits bilaterally.  Nails Thick disfigured discolored nails with subungual debris  from hallux to fifth toes bilaterally. Pincer nails . No evidence of bacterial infection or drainage bilaterally.  Orthopedic  No limitations of motion  feet .  No crepitus or effusions noted.  No bony pathology or digital deformities noted.  Skin  normotropic skin with no porokeratosis noted bilaterally.  No signs of infections or ulcers noted.     Onychomycosis  Pain in right toes  Pain in left toes  Consent was obtained for treatment procedures.   Mechanical debridement of nails 1-5  bilaterally performed with a nail nipper.  Filed with dremel without incident.    Return office visit   10 weeks                 Told patient to return for periodic foot care and evaluation due to potential at risk complications.   Josafat Enrico DPM  

## 2020-10-29 ENCOUNTER — Ambulatory Visit (INDEPENDENT_AMBULATORY_CARE_PROVIDER_SITE_OTHER): Payer: Medicare Other | Admitting: Podiatry

## 2020-10-29 ENCOUNTER — Other Ambulatory Visit: Payer: Self-pay

## 2020-10-29 ENCOUNTER — Encounter: Payer: Self-pay | Admitting: Podiatry

## 2020-10-29 DIAGNOSIS — M79674 Pain in right toe(s): Secondary | ICD-10-CM

## 2020-10-29 DIAGNOSIS — B351 Tinea unguium: Secondary | ICD-10-CM | POA: Diagnosis not present

## 2020-10-29 DIAGNOSIS — M79675 Pain in left toe(s): Secondary | ICD-10-CM | POA: Diagnosis not present

## 2020-10-29 DIAGNOSIS — E119 Type 2 diabetes mellitus without complications: Secondary | ICD-10-CM

## 2020-10-29 DIAGNOSIS — N183 Chronic kidney disease, stage 3 unspecified: Secondary | ICD-10-CM

## 2020-10-29 NOTE — Progress Notes (Signed)
This patient returns to my office for at risk foot care.  This patient requires this care by a professional since this patient will be at risk due to having diabetes and chronic kidney disease.  This patient is unable to cut nails herself since the patient cannot reach her nails .These nails are painful walking and wearing shoes.  This patient presents for at risk foot care today.  General Appearance  Alert, conversant and in no acute stress.  Vascular  Dorsalis pedis and posterior tibial  pulses are palpable  bilaterally.  Capillary return is within normal limits  bilaterally. Temperature is within normal limits  bilaterally.  Neurologic  Senn-Weinstein monofilament wire test within normal limits  bilaterally. Muscle power within normal limits bilaterally.  Nails Thick disfigured discolored nails with subungual debris  from hallux to fifth toes bilaterally. Pincer nails . No evidence of bacterial infection or drainage bilaterally.  Orthopedic  No limitations of motion  feet .  No crepitus or effusions noted.  No bony pathology or digital deformities noted.  Skin  normotropic skin with no porokeratosis noted bilaterally.  No signs of infections or ulcers noted.     Onychomycosis  Pain in right toes  Pain in left toes  Consent was obtained for treatment procedures.   Mechanical debridement of nails 1-5  bilaterally performed with a nail nipper.  Filed with dremel without incident.    Return office visit   10 weeks                 Told patient to return for periodic foot care and evaluation due to potential at risk complications.   Zonnie Landen DPM  

## 2021-01-14 ENCOUNTER — Ambulatory Visit (INDEPENDENT_AMBULATORY_CARE_PROVIDER_SITE_OTHER): Payer: Medicare Other | Admitting: Podiatry

## 2021-01-14 ENCOUNTER — Encounter: Payer: Self-pay | Admitting: Podiatry

## 2021-01-14 ENCOUNTER — Other Ambulatory Visit: Payer: Self-pay

## 2021-01-14 DIAGNOSIS — E119 Type 2 diabetes mellitus without complications: Secondary | ICD-10-CM | POA: Diagnosis not present

## 2021-01-14 DIAGNOSIS — N183 Chronic kidney disease, stage 3 unspecified: Secondary | ICD-10-CM

## 2021-01-14 DIAGNOSIS — B351 Tinea unguium: Secondary | ICD-10-CM | POA: Diagnosis not present

## 2021-01-14 DIAGNOSIS — M79675 Pain in left toe(s): Secondary | ICD-10-CM | POA: Diagnosis not present

## 2021-01-14 DIAGNOSIS — M79674 Pain in right toe(s): Secondary | ICD-10-CM

## 2021-01-14 NOTE — Progress Notes (Signed)
This patient returns to my office for at risk foot care.  This patient requires this care by a professional since this patient will be at risk due to having diabetes and chronic kidney disease.  This patient is unable to cut nails herself since the patient cannot reach her nails .These nails are painful walking and wearing shoes.  This patient presents for at risk foot care today.  General Appearance  Alert, conversant and in no acute stress.  Vascular  Dorsalis pedis and posterior tibial  pulses are palpable  bilaterally.  Capillary return is within normal limits  bilaterally. Temperature is within normal limits  bilaterally.  Neurologic  Senn-Weinstein monofilament wire test within normal limits  bilaterally. Muscle power within normal limits bilaterally.  Nails Thick disfigured discolored nails with subungual debris  from hallux to fifth toes bilaterally. Pincer nails . No evidence of bacterial infection or drainage bilaterally.  Orthopedic  No limitations of motion  feet .  No crepitus or effusions noted.  No bony pathology or digital deformities noted.  Skin  normotropic skin with no porokeratosis noted bilaterally.  No signs of infections or ulcers noted.     Onychomycosis  Pain in right toes  Pain in left toes  Consent was obtained for treatment procedures.   Mechanical debridement of nails 1-5  bilaterally performed with a nail nipper.  Filed with dremel without incident.    Return office visit   10 weeks                 Told patient to return for periodic foot care and evaluation due to potential at risk complications.   Natoya Viscomi DPM  

## 2021-04-06 ENCOUNTER — Encounter (INDEPENDENT_AMBULATORY_CARE_PROVIDER_SITE_OTHER): Payer: Medicare Other | Admitting: Ophthalmology

## 2021-04-06 ENCOUNTER — Other Ambulatory Visit: Payer: Self-pay

## 2021-04-06 ENCOUNTER — Encounter (INDEPENDENT_AMBULATORY_CARE_PROVIDER_SITE_OTHER): Payer: Self-pay | Admitting: Ophthalmology

## 2021-04-06 ENCOUNTER — Ambulatory Visit (INDEPENDENT_AMBULATORY_CARE_PROVIDER_SITE_OTHER): Payer: Medicare Other | Admitting: Ophthalmology

## 2021-04-06 DIAGNOSIS — E113553 Type 2 diabetes mellitus with stable proliferative diabetic retinopathy, bilateral: Secondary | ICD-10-CM | POA: Diagnosis not present

## 2021-04-06 DIAGNOSIS — Z794 Long term (current) use of insulin: Secondary | ICD-10-CM | POA: Diagnosis not present

## 2021-04-06 DIAGNOSIS — H35371 Puckering of macula, right eye: Secondary | ICD-10-CM

## 2021-04-06 NOTE — Assessment & Plan Note (Signed)
Minor not impactful on vision ?

## 2021-04-06 NOTE — Progress Notes (Addendum)
? ? ?04/06/2021 ? ?  ? ?CHIEF COMPLAINT ?Patient presents for  ?Chief Complaint  ?Patient presents with  ? Retina Follow Up  ? 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 ? ? ? ?HISTORY OF PRESENT ILLNESS: ?[name] is a 76 y.o. female who presents to the clinic today for:  ? ?HPI   ? ? Retina Follow Up   ? ?      ? Diagnosis: Other  ? Laterality: both eyes  ? Severity: severe  ? Course: stable  ? ?  ?  ? ? Comments   ?9 mos fu ou oct fp. ?Pt states no changes in medical history and vision. ?Pt denies floaters and FOL. ?Pt is still taking insulin to maintain blood sugar. ?In feb the A1 was 7.5. ? ? ? ?  ?  ?Last edited by Hurman Horn, MD on 04/06/2021 10:53 AM.  ?  ? ? ?Referring physician: ?Wenda Low, MD ?[address] ?[address] ?[place],  [place] [address] ? ?HISTORICAL INFORMATION:  ? ?Selected notes from the [facility] ?  ? ?Lab Results  ?Component Value Date  ? HGBA1C 7.9 (H) 09/23/2015  ?  ? ?CURRENT MEDICATIONS: ?Current Outpatient Medications (Ophthalmic Drugs)  ?Medication Sig  ? bacitracin ophthalmic ointment bacitracin 500 unit/gram eye ointment ? APPLY A SMALL AMOUNT TO BOTH EYES NIGHTLY, START 1 WEEK PRIOR TO SURGERY  ? brimonidine (ALPHAGAN) 0.2 % ophthalmic solution brimonidine 0.2 % eye drops ? INSTILL 1 DROP IN RIGHT EYE TWICE A DAY, START 48 HOURS PRIOR TO SURGERY (Patient not taking: Reported on 07/07/2020)  ? cyclopentolate (CYCLODRYL,CYCLOGYL) 2 % ophthalmic solution cyclopentolate 2 % eye drops ? INSTILL 1 DROP IN RIGHT EYE THREE TIMES A DAY, START 24 HOURS PRIOR TO SURGERY (Patient not taking: Reported on 07/07/2020)  ? ketorolac (ACULAR) 0.5 % ophthalmic solution ketorolac 0.5 % eye drops ? INSTILL 1 DROP IN RIGHT EYE FOUR TIMES A DAY START 1 WEEK PRIOR TO SURGERY (Patient not taking: Reported on 07/07/2020)  ? ofloxacin (OCUFLOX) 0.3 % ophthalmic solution ofloxacin 0.3 % eye drops ? INSTILL 1 DROP IN BOTH EYES FOUR TIMES A DAY START 72 HOURS PRIOR TO SURGERY (Patient not  taking: Reported on 07/07/2020)  ? prednisoLONE acetate (PRED FORTE) 1 % ophthalmic suspension prednisolone acetate 1 % eye drops,suspension ? INSTILL 1 DROP IN RIGHT EYE FOUR TIMES A DAY START AFTER SURGERY  ? ?No current facility-administered medications for this visit. (Ophthalmic Drugs)  ? ?Current Outpatient Medications (Other)  ?Medication Sig  ? Accu-Chek FastClix Lancets MISC USE TO TEST BLOOD SUGARS AT LEAST 5 TIMES A DAY (E11.65)  ? albuterol (VENTOLIN HFA) 108 (90 Base) MCG/ACT inhaler Ventolin HFA 90 mcg/actuation aerosol inhaler ? 2 PUFFS AS NEEDED EVERY 6 HRS INHALATION 30 DAYS  ? ALPRAZolam (XANAX) 0.25 MG tablet Take 0.125-0.25 mg by mouth 3 (three) times daily as needed for anxiety.   ? amLODipine (NORVASC) 5 MG tablet Take 5 mg by mouth at bedtime.  ? aspirin EC 81 MG tablet Take 81 mg by mouth daily.  ? atorvastatin (LIPITOR) 40 MG tablet Take 40 mg by mouth at bedtime.  ? benzonatate (TESSALON) 100 MG capsule Take 1 capsule (100 mg total) by mouth every 8 (eight) hours.  ? buPROPion (WELLBUTRIN XL) 150 MG 24 hr tablet Take 150 mg by mouth every morning.  ? busPIRone (BUSPAR) 5 MG tablet Take 5 mg by mouth 2 (two) times daily.  ? candesartan (ATACAND) 32 MG tablet  Take 32 mg by mouth at bedtime.  ? chlorpheniramine-HYDROcodone (TUSSIONEX) 10-8 MG/5ML SUER hydrocodone 10 mg-chlorpheniramine 8 mg/5 mL oral susp extend.rel 12hr ? TAKE 5 ML AS NEEDED AT BEDTIME ORALLY 10 DAYS  ? ciprofloxacin (CIPRO) 250 MG tablet ciprofloxacin 250 mg tablet ? TAKE 1 TABLET BY MOUTH TWICE A DAY  ? citalopram (CELEXA) 10 MG tablet Take 10 mg by mouth daily.  ? cloNIDine (CATAPRES - DOSED IN MG/24 HR) 0.2 mg/24hr patch APPLY 1 PATCH TOPICALLY TO  SKIN ONCE A WEEK  ? doxazosin (CARDURA) 8 MG tablet TAKE 1/2 TABLET BY MOUTH ONCE A DAY  ? doxycycline (MONODOX) 100 MG capsule doxycycline monohydrate 100 mg capsule ? TAKE ONE CAPSULE BY MOUTH TWICE A DAY  ? escitalopram (LEXAPRO) 10 MG tablet escitalopram 10 mg tablet ? 1/2  TABLET X 2WEEK THEN 1 TABLET ONCE A DAY ORALLY 30 DAY(S)  ? fluconazole (DIFLUCAN) 150 MG tablet fluconazole 150 mg tablet ? TAKE FIRST TABLET ON DAY 1 FOLLOWED BY SECOND TABLET IN 3-4- DAYS = FOR YEAST INFECTION  ? fluticasone (FLONASE) 50 MCG/ACT nasal spray fluticasone propionate 50 mcg/actuation nasal spray,suspension ? 2 SPRAY IN EACH NOSTRIL ONCE A DAY  ? gabapentin (NEURONTIN) 300 MG capsule Take 300 mg by mouth 3 (three) times daily.  ? glucose blood test strip Accu-Chek Aviva Plus test strips ? USE AS DIRECTED. TESTING FREQUENCY: 5X/DAILY.  ? hydrochlorothiazide (HYDRODIURIL) 25 MG tablet TAKE 2 TABLETS BY MOUTH EVERY DAY  ? HYDROcodone-acetaminophen (NORCO) 7.5-325 MG tablet hydrocodone 7.5 mg-acetaminophen 325 mg tablet ? TAKE 1 TABLET BY MOUTH EVERY DAY AS NEEDED FOR PAIN  ? hydrocortisone (ANUSOL-HC) 25 MG suppository hydrocortisone acetate 25 mg rectal suppository ? INSERT 1 SUPPOSITORY RECTALLYTWICE FOR 10 DAYS  ? hydrocortisone 2.5 % cream hydrocortisone 2.5 % topical cream ? 1 APPLICATION APPLY ON THE SKIN TWICE A DAY APPLY THIN LAYER TO SKIN TWICE DAILY  ? insulin glargine (LANTUS) 100 UNIT/ML injection Inject into the skin.  ? insulin lispro (HUMALOG) 100 UNIT/ML KwikPen INJECT SUBCUTANEOUSLY 15  UNITS 3 TIMES DAILY BEFORE  EACH MEAL  ? Insulin Pen Needle (B-D ULTRAFINE III SHORT PEN) 31G X 8 MM MISC USE TO INJECT INSULIN 3  TIMES DAILY AS DIRECTED  ? Insulin Syringe-Needle U-100 (BD SAFETYGLIDE INSULIN SYRINGE) 31G X 15/64" 1 ML MISC 1 Syringe by Misc.(Non-Drug; Combo Route) route 2 times daily.  ? ketoconazole (NIZORAL) 2 % cream APPLY TO AFFECTED AREA EVERY DAY AS NEEDED FOR REDNESS AND ITCHING  ? lansoprazole (PREVACID SOLUTAB) 30 MG disintegrating tablet Prevacid SoluTab 30 mg delayed release,disintegrating tablet ? TAKE 1 TABLET ONCE DAILY.  ? levothyroxine (SYNTHROID) 200 MCG tablet TAKE 1 TABLET (200 MCG TOTAL) BY MOUTH DAILY BEFORE BREAKFAST. (DAW)  ? metFORMIN (GLUCOPHAGE-XR) 500 MG 24  hr tablet metformin ER 500 mg tablet,extended release 24 hr ? TAKE ONE TABLET BY MOUTH TWICE A DAY WITH A MEAL  ? metoprolol succinate (TOPROL-XL) 50 MG 24 hr tablet Take 1 tablet by mouth daily.  ? neomycin-polymyxin-hydrocortisone (CORTISPORIN) 3.5-10000-1 OTIC suspension neomycin-polymyxin-hydrocort 3.5 mg-10,000 unit/mL-1 % ear drops,susp ? USE 2 TO 5 DROPS TWICE A DAY INTO EACH AFFECTED EAR FOR 10 DAYS  ? ondansetron (ZOFRAN) 4 MG tablet ondansetron HCl 4 mg tablet ? TAKE 1 TABLET BY MOUTH AS NEEDED FOR NAUSEA  ? ondansetron (ZOFRAN-ODT) 4 MG disintegrating tablet ondansetron 4 mg disintegrating tablet ? 1 TABLET AS NEEDED FOR NAUSEA EVERY 8 HOURS ORALLY  ? oxyCODONE-acetaminophen (PERCOCET/ROXICET) 5-325 MG tablet oxycodone-acetaminophen 5 mg-325  mg tablet ? TAKE 1/2 TABLET BY MOUTH EVERY 6 HOURS AS NEEDED FOR SEVERE PAIN  ? pantoprazole (PROTONIX) 20 MG tablet pantoprazole 20 mg tablet,delayed release ? TAKE 1 TABLET BY MOUTH 2 TIMES A DAY 90 DAYS  ? pantoprazole (PROTONIX) 40 MG tablet pantoprazole 40 mg tablet,delayed release ? TAKE 1 TABLET BY MOUTH TWICE A DAY FOR 1 MONTH THEN ONCE DAILY  ? potassium chloride SA (KLOR-CON) 20 MEQ tablet Klor-Con M20 mEq tablet,extended release ? TAKE 1 TABLET BY MOUTH EVERY DAY  ? QUEtiapine (SEROQUEL) 25 MG tablet quetiapine 25 mg tablet ? TAKE 1 TABLET BY MOUTH AT BEDTIME FOR SLEEP/DEPRESSION  ? sertraline (ZOLOFT) 25 MG tablet sertraline 25 mg tablet ? TAKE 1 TABLET EVERY DAY  ? triamcinolone cream (KENALOG) 0.1 % PLEASE SEE ATTACHED FOR DETAILED DIRECTIONS  ? UNABLE TO FIND ergocalciferol (vitamin D2) 50,000 unit capsule  ? UNABLE TO FIND Accu-Chek FastClix Lancing Device  ? UNABLE TO FIND Accu-Chek Fastclix Lancet Drum  ? UNABLE TO FIND USE TO TEST BLOOD SUGARS AT LEAST 5 TIMES A DAY (E11.65)  ? UNABLE TO FIND Accu-Chek Fastclix Lancet Drum ? USE TO TEST BLOOD SUGARS AT LEAST 5 TIMES A DAY (E11.65)  ? UNABLE TO FIND ergocalciferol (vitamin D2) 1,250 mcg (50,000 unit)  capsule ? TAKE 1 CAPSULE (50,000 UNITS TOTAL) BY MOUTH ONCE A WEEK. TAKE 1 CAPSULE ONCE A WEEK  ? UNABLE TO FIND 1 APPLICATION TWICE A DAY RECTAL 30 DAY(S)  ? valACYclovir (VALTREX) 500 MG tablet ONE T

## 2021-04-06 NOTE — Assessment & Plan Note (Signed)
Quiescent PDR OU stable overallThe nature of regressed proliferative diabetic retinopathy was discussed with the patient. The patient was advised to maintain good glucose, blood pressure, monitor kidney function and serum lipid control as advised by personal physician. Rare risk for reactivation of progression exist with untreated severe anemia, untreated renal failure, untreated heart failure, and smoking. ?Complete avoidance of smoking was recommended. The chance of recurrent proliferative diabetic retinopathy was discussed as well as the chance of vitreous hemorrhage for which further treatments may be necessary.   Explained to the patient that the quiescent  proliferative diabetic retinopathy disease is unlikely to ever worsen.  Worsening factors would include however severe anemia, hypertension out-of-control or impending renal failure. ?

## 2021-04-22 ENCOUNTER — Ambulatory Visit (INDEPENDENT_AMBULATORY_CARE_PROVIDER_SITE_OTHER): Payer: Medicare Other | Admitting: Podiatry

## 2021-04-22 ENCOUNTER — Encounter: Payer: Self-pay | Admitting: Podiatry

## 2021-04-22 DIAGNOSIS — N183 Chronic kidney disease, stage 3 unspecified: Secondary | ICD-10-CM

## 2021-04-22 DIAGNOSIS — E119 Type 2 diabetes mellitus without complications: Secondary | ICD-10-CM

## 2021-04-22 DIAGNOSIS — B351 Tinea unguium: Secondary | ICD-10-CM | POA: Diagnosis not present

## 2021-04-22 DIAGNOSIS — M79675 Pain in left toe(s): Secondary | ICD-10-CM | POA: Diagnosis not present

## 2021-04-22 DIAGNOSIS — M79674 Pain in right toe(s): Secondary | ICD-10-CM

## 2021-04-22 NOTE — Progress Notes (Signed)
This patient returns to my office for at risk foot care.  This patient requires this care by a professional since this patient will be at risk due to having diabetes and chronic kidney disease.  This patient is unable to cut nails herself since the patient cannot reach her nails .These nails are painful walking and wearing shoes.  This patient presents for at risk foot care today.  General Appearance  Alert, conversant and in no acute stress.  Vascular  Dorsalis pedis and posterior tibial  pulses are palpable  bilaterally.  Capillary return is within normal limits  bilaterally. Temperature is within normal limits  bilaterally.  Neurologic  Senn-Weinstein monofilament wire test within normal limits  bilaterally. Muscle power within normal limits bilaterally.  Nails Thick disfigured discolored nails with subungual debris  from hallux to fifth toes bilaterally. Pincer nails . No evidence of bacterial infection or drainage bilaterally.  Orthopedic  No limitations of motion  feet .  No crepitus or effusions noted.  No bony pathology or digital deformities noted.  Skin  normotropic skin with no porokeratosis noted bilaterally.  No signs of infections or ulcers noted.     Onychomycosis  Pain in right toes  Pain in left toes  Consent was obtained for treatment procedures.   Mechanical debridement of nails 1-5  bilaterally performed with a nail nipper.  Filed with dremel without incident.    Return office visit   10 weeks                 Told patient to return for periodic foot care and evaluation due to potential at risk complications.   Tyrie Porzio DPM  

## 2021-05-26 ENCOUNTER — Other Ambulatory Visit: Payer: Self-pay | Admitting: Internal Medicine

## 2021-05-26 DIAGNOSIS — Z85528 Personal history of other malignant neoplasm of kidney: Secondary | ICD-10-CM

## 2021-06-15 ENCOUNTER — Ambulatory Visit
Admission: RE | Admit: 2021-06-15 | Discharge: 2021-06-15 | Disposition: A | Discharge status: Home / Self Care | Payer: Medicare Other | Source: Ambulatory Visit | Attending: Internal Medicine | Admitting: Internal Medicine

## 2021-06-15 DIAGNOSIS — Z85528 Personal history of other malignant neoplasm of kidney: Secondary | ICD-10-CM

## 2021-06-15 MED ORDER — GADOBENATE DIMEGLUMINE 529 MG/ML IV SOLN
20.0000 mL | Freq: Once | INTRAVENOUS | Status: AC | PRN
  Administered 2021-06-15: 20 mL via INTRAVENOUS

## 2021-07-01 ENCOUNTER — Ambulatory Visit (INDEPENDENT_AMBULATORY_CARE_PROVIDER_SITE_OTHER): Payer: Medicare Other | Admitting: Podiatry

## 2021-07-01 ENCOUNTER — Encounter: Payer: Self-pay | Admitting: Podiatry

## 2021-07-01 DIAGNOSIS — B351 Tinea unguium: Secondary | ICD-10-CM | POA: Diagnosis not present

## 2021-07-01 DIAGNOSIS — M79674 Pain in right toe(s): Secondary | ICD-10-CM | POA: Diagnosis not present

## 2021-07-01 DIAGNOSIS — E119 Type 2 diabetes mellitus without complications: Secondary | ICD-10-CM | POA: Diagnosis not present

## 2021-07-01 DIAGNOSIS — M79675 Pain in left toe(s): Secondary | ICD-10-CM | POA: Diagnosis not present

## 2021-07-01 DIAGNOSIS — N183 Chronic kidney disease, stage 3 unspecified: Secondary | ICD-10-CM

## 2021-07-01 NOTE — Progress Notes (Signed)
This patient returns to my office for at risk foot care.  This patient requires this care by a professional since this patient will be at risk due to having diabetes and chronic kidney disease.  This patient is unable to cut nails herself since the patient cannot reach her nails .These nails are painful walking and wearing shoes.  This patient presents for at risk foot care today.  General Appearance  Alert, conversant and in no acute stress.  Vascular  Dorsalis pedis and posterior tibial  pulses are palpable  bilaterally.  Capillary return is within normal limits  bilaterally. Temperature is within normal limits  bilaterally.  Neurologic  Senn-Weinstein monofilament wire test within normal limits  bilaterally. Muscle power within normal limits bilaterally.  Nails Thick disfigured discolored nails with subungual debris  from hallux to fifth toes bilaterally. Pincer nails . No evidence of bacterial infection or drainage bilaterally.  Orthopedic  No limitations of motion  feet .  No crepitus or effusions noted.  No bony pathology or digital deformities noted.  Skin  normotropic skin with no porokeratosis noted bilaterally.  No signs of infections or ulcers noted.     Onychomycosis  Pain in right toes  Pain in left toes  Consent was obtained for treatment procedures.   Mechanical debridement of nails 1-5  bilaterally performed with a nail nipper.  Filed with dremel without incident.    Return office visit   10 weeks                 Told patient to return for periodic foot care and evaluation due to potential at risk complications.   Florena Kozma DPM  

## 2021-09-16 ENCOUNTER — Ambulatory Visit (INDEPENDENT_AMBULATORY_CARE_PROVIDER_SITE_OTHER): Payer: Medicare Other | Admitting: Podiatry

## 2021-09-16 ENCOUNTER — Encounter: Payer: Self-pay | Admitting: Podiatry

## 2021-09-16 DIAGNOSIS — E119 Type 2 diabetes mellitus without complications: Secondary | ICD-10-CM | POA: Diagnosis not present

## 2021-09-16 DIAGNOSIS — M79674 Pain in right toe(s): Secondary | ICD-10-CM | POA: Diagnosis not present

## 2021-09-16 DIAGNOSIS — N183 Chronic kidney disease, stage 3 unspecified: Secondary | ICD-10-CM

## 2021-09-16 DIAGNOSIS — B351 Tinea unguium: Secondary | ICD-10-CM

## 2021-09-16 DIAGNOSIS — M79675 Pain in left toe(s): Secondary | ICD-10-CM

## 2021-09-16 NOTE — Progress Notes (Signed)
This patient returns to my office for at risk foot care.  This patient requires this care by a professional since this patient will be at risk due to having diabetes and chronic kidney disease.  This patient is unable to cut nails herself since the patient cannot reach her nails .These nails are painful walking and wearing shoes.  This patient presents for at risk foot care today.  General Appearance  Alert, conversant and in no acute stress.  Vascular  Dorsalis pedis and posterior tibial  pulses are palpable  bilaterally.  Capillary return is within normal limits  bilaterally. Temperature is within normal limits  bilaterally.  Neurologic  Senn-Weinstein monofilament wire test within normal limits  bilaterally. Muscle power within normal limits bilaterally.  Nails Thick disfigured discolored nails with subungual debris  from hallux to fifth toes bilaterally. Pincer nails . No evidence of bacterial infection or drainage bilaterally.  Orthopedic  No limitations of motion  feet .  No crepitus or effusions noted.  No bony pathology or digital deformities noted.  Skin  normotropic skin with no porokeratosis noted bilaterally.  No signs of infections or ulcers noted.     Onychomycosis  Pain in right toes  Pain in left toes  Consent was obtained for treatment procedures.   Mechanical debridement of nails 1-5  bilaterally performed with a nail nipper.  Filed with dremel without incident.    Return office visit   10 weeks                 Told patient to return for periodic foot care and evaluation due to potential at risk complications.   Shenouda Genova DPM  

## 2021-11-30 ENCOUNTER — Ambulatory Visit: Payer: Medicare Other | Admitting: Podiatry

## 2021-12-01 ENCOUNTER — Encounter: Payer: Self-pay | Admitting: Podiatry

## 2021-12-01 ENCOUNTER — Ambulatory Visit (INDEPENDENT_AMBULATORY_CARE_PROVIDER_SITE_OTHER): Payer: Medicare Other | Admitting: Podiatry

## 2021-12-01 DIAGNOSIS — B351 Tinea unguium: Secondary | ICD-10-CM

## 2021-12-01 DIAGNOSIS — M79675 Pain in left toe(s): Secondary | ICD-10-CM | POA: Diagnosis not present

## 2021-12-01 DIAGNOSIS — E119 Type 2 diabetes mellitus without complications: Secondary | ICD-10-CM

## 2021-12-01 DIAGNOSIS — M79674 Pain in right toe(s): Secondary | ICD-10-CM

## 2021-12-01 DIAGNOSIS — N183 Chronic kidney disease, stage 3 unspecified: Secondary | ICD-10-CM | POA: Diagnosis not present

## 2021-12-01 NOTE — Progress Notes (Signed)
This patient returns to my office for at risk foot care.  This patient requires this care by a professional since this patient will be at risk due to having diabetes and chronic kidney disease.  This patient is unable to cut nails herself since the patient cannot reach her nails .These nails are painful walking and wearing shoes.  This patient presents for at risk foot care today.  General Appearance  Alert, conversant and in no acute stress.  Vascular  Dorsalis pedis and posterior tibial  pulses are palpable  bilaterally.  Capillary return is within normal limits  bilaterally. Temperature is within normal limits  bilaterally.  Neurologic  Senn-Weinstein monofilament wire test within normal limits  bilaterally. Muscle power within normal limits bilaterally.  Nails Thick disfigured discolored nails with subungual debris  from hallux to fifth toes bilaterally. Pincer nails . No evidence of bacterial infection or drainage bilaterally.  Orthopedic  No limitations of motion  feet .  No crepitus or effusions noted.  No bony pathology or digital deformities noted.  Skin  normotropic skin with no porokeratosis noted bilaterally.  No signs of infections or ulcers noted.     Onychomycosis  Pain in right toes  Pain in left toes  Consent was obtained for treatment procedures.   Mechanical debridement of nails 1-5  bilaterally performed with a nail nipper.  Filed with dremel without incident.    Return office visit   10 weeks                 Told patient to return for periodic foot care and evaluation due to potential at risk complications.   Teal Raben DPM  

## 2022-01-06 ENCOUNTER — Encounter (INDEPENDENT_AMBULATORY_CARE_PROVIDER_SITE_OTHER): Payer: Medicare Other | Admitting: Ophthalmology

## 2022-02-08 ENCOUNTER — Emergency Department (HOSPITAL_BASED_OUTPATIENT_CLINIC_OR_DEPARTMENT_OTHER): Payer: Medicare Other | Admitting: Radiology

## 2022-02-08 ENCOUNTER — Encounter (HOSPITAL_BASED_OUTPATIENT_CLINIC_OR_DEPARTMENT_OTHER): Payer: Self-pay | Admitting: Emergency Medicine

## 2022-02-08 ENCOUNTER — Emergency Department (HOSPITAL_BASED_OUTPATIENT_CLINIC_OR_DEPARTMENT_OTHER)
Admission: EM | Admit: 2022-02-08 | Discharge: 2022-02-08 | Disposition: A | Discharge status: Home / Self Care | Payer: Medicare Other | Attending: Emergency Medicine | Admitting: Emergency Medicine

## 2022-02-08 ENCOUNTER — Emergency Department (HOSPITAL_BASED_OUTPATIENT_CLINIC_OR_DEPARTMENT_OTHER): Payer: Medicare Other

## 2022-02-08 ENCOUNTER — Other Ambulatory Visit: Payer: Self-pay

## 2022-02-08 DIAGNOSIS — R0600 Dyspnea, unspecified: Secondary | ICD-10-CM

## 2022-02-08 DIAGNOSIS — Z85528 Personal history of other malignant neoplasm of kidney: Secondary | ICD-10-CM | POA: Diagnosis not present

## 2022-02-08 DIAGNOSIS — M7918 Myalgia, other site: Secondary | ICD-10-CM | POA: Diagnosis not present

## 2022-02-08 DIAGNOSIS — M542 Cervicalgia: Secondary | ICD-10-CM | POA: Diagnosis present

## 2022-02-08 DIAGNOSIS — R0602 Shortness of breath: Secondary | ICD-10-CM | POA: Insufficient documentation

## 2022-02-08 DIAGNOSIS — Z1152 Encounter for screening for COVID-19: Secondary | ICD-10-CM | POA: Diagnosis not present

## 2022-02-08 DIAGNOSIS — M791 Myalgia, unspecified site: Secondary | ICD-10-CM

## 2022-02-08 LAB — CBC WITH DIFFERENTIAL/PLATELET
Abs Immature Granulocytes: 0.01 10*3/uL (ref 0.00–0.07)
Basophils Absolute: 0.1 10*3/uL (ref 0.0–0.1)
Basophils Relative: 1 %
Eosinophils Absolute: 0.1 10*3/uL (ref 0.0–0.5)
Eosinophils Relative: 2 %
HCT: 39.7 % (ref 36.0–46.0)
Hemoglobin: 12.7 g/dL (ref 12.0–15.0)
Immature Granulocytes: 0 %
Lymphocytes Relative: 38 %
Lymphs Abs: 2.2 10*3/uL (ref 0.7–4.0)
MCH: 29.3 pg (ref 26.0–34.0)
MCHC: 32 g/dL (ref 30.0–36.0)
MCV: 91.7 fL (ref 80.0–100.0)
Monocytes Absolute: 0.5 10*3/uL (ref 0.1–1.0)
Monocytes Relative: 9 %
Neutro Abs: 2.9 10*3/uL (ref 1.7–7.7)
Neutrophils Relative %: 50 %
Platelets: 165 10*3/uL (ref 150–400)
RBC: 4.33 MIL/uL (ref 3.87–5.11)
RDW: 14.6 % (ref 11.5–15.5)
WBC: 5.7 10*3/uL (ref 4.0–10.5)
nRBC: 0 % (ref 0.0–0.2)

## 2022-02-08 LAB — COMPREHENSIVE METABOLIC PANEL
ALT: 6 U/L (ref 0–44)
AST: 12 U/L — ABNORMAL LOW (ref 15–41)
Albumin: 4.1 g/dL (ref 3.5–5.0)
Alkaline Phosphatase: 70 U/L (ref 38–126)
Anion gap: 9 (ref 5–15)
BUN: 19 mg/dL (ref 8–23)
CO2: 32 mmol/L (ref 22–32)
Calcium: 9.7 mg/dL (ref 8.9–10.3)
Chloride: 100 mmol/L (ref 98–111)
Creatinine, Ser: 1.28 mg/dL — ABNORMAL HIGH (ref 0.44–1.00)
GFR, Estimated: 44 mL/min — ABNORMAL LOW (ref >60)
Glucose, Bld: 108 mg/dL — ABNORMAL HIGH (ref 70–99)
Potassium: 3.7 mmol/L (ref 3.5–5.1)
Sodium: 141 mmol/L (ref 135–145)
Total Bilirubin: 1.1 mg/dL (ref 0.3–1.2)
Total Protein: 7.4 g/dL (ref 6.5–8.1)

## 2022-02-08 LAB — URINALYSIS, ROUTINE W REFLEX MICROSCOPIC
Bacteria, UA: NONE SEEN
Bilirubin Urine: NEGATIVE
Glucose, UA: >1000 mg/dL — AB
Hgb urine dipstick: NEGATIVE
Ketones, ur: NEGATIVE mg/dL
Leukocytes,Ua: NEGATIVE
Nitrite: NEGATIVE
Protein, ur: NEGATIVE mg/dL
Specific Gravity, Urine: 1.019 (ref 1.005–1.030)
pH: 5.5 (ref 5.0–8.0)

## 2022-02-08 LAB — TROPONIN I (HIGH SENSITIVITY)
Troponin I (High Sensitivity): 14 ng/L (ref <18)
Troponin I (High Sensitivity): 17 ng/L (ref <18)

## 2022-02-08 LAB — RESP PANEL BY RT-PCR (RSV, FLU A&B, COVID)  RVPGX2
Influenza A by PCR: NEGATIVE
Influenza B by PCR: NEGATIVE
Resp Syncytial Virus by PCR: NEGATIVE
SARS Coronavirus 2 by RT PCR: NEGATIVE

## 2022-02-08 LAB — CK: Total CK: 90 U/L (ref 38–234)

## 2022-02-08 MED ORDER — LIDOCAINE 5 % EX PTCH: 1.0000 | MEDICATED_PATCH | CUTANEOUS | 0 refills | Status: AC

## 2022-02-08 NOTE — ED Triage Notes (Signed)
Pt arrives to ED with c/o chills, fever since 1/21.
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
## 2022-02-08 NOTE — Discharge Instructions (Signed)
Your testing is reassuring.  Electrolytes are normal.  CT scan of neck and back are negative for acute traumatic injury but you do have legs with areas of arthritis and bulging disks in your back.  You should follow-up with your primary doctor as well as a cardiologist for consideration of a stress test given your issues with difficulty breathing at times. Return to the ED with exertional chest pain, pain associate with shortness of breath, nausea, vomiting, sweating or other concerns.

## 2022-02-26 ENCOUNTER — Telehealth: Payer: Self-pay | Admitting: Pharmacist

## 2022-02-26 ENCOUNTER — Encounter: Payer: Self-pay | Admitting: Interventional Cardiology

## 2022-02-26 ENCOUNTER — Ambulatory Visit: Payer: Medicare Other | Attending: Interventional Cardiology | Admitting: Interventional Cardiology

## 2022-02-26 VITALS — BP 140/66 | HR 51 | Ht 68.0 in | Wt 306.8 lb

## 2022-02-26 DIAGNOSIS — I10 Essential (primary) hypertension: Secondary | ICD-10-CM

## 2022-02-26 DIAGNOSIS — R0609 Other forms of dyspnea: Secondary | ICD-10-CM

## 2022-02-26 DIAGNOSIS — I872 Venous insufficiency (chronic) (peripheral): Secondary | ICD-10-CM

## 2022-02-26 DIAGNOSIS — E782 Mixed hyperlipidemia: Secondary | ICD-10-CM

## 2022-02-26 DIAGNOSIS — R600 Localized edema: Secondary | ICD-10-CM | POA: Diagnosis not present

## 2022-02-26 MED ORDER — MOUNJARO 5 MG/0.5ML ~~LOC~~ SOAJ: 5.0000 mg | SUBCUTANEOUS | 0 refills | Status: DC
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
## 2022-02-26 NOTE — Addendum Note (Signed)
Addended by: Aras Albarran E on: 02/26/2022 11:44 AM   Modules accepted: Orders

## 2022-02-26 NOTE — Patient Instructions (Signed)
Medication Instructions:  Your physician recommends that you continue on your current medications as directed. Please refer to the Current Medication list given to you today.  *If you need a refill on your cardiac medications before your next appointment, please call your pharmacy*   Lab Work: none If you have labs (blood work) drawn today and your tests are completely normal, you will receive your results only by: [place] (if you have MyChart) OR A paper copy in the mail If you have any lab test that is abnormal or we need to change your treatment, we will call you to review the results.   Testing/Procedures: Your physician has requested that you have an echocardiogram. Echocardiography is a painless test that uses sound waves to create images of your heart. It provides your doctor with information about the size and shape of your heart and how well your heart's chambers and valves are working. This procedure takes approximately one hour. There are no restrictions for this procedure. Please do NOT wear cologne, perfume, aftershave, or lotions (deodorant is allowed). Please arrive 15 minutes prior to your appointment time.    Follow-Up: At [facility], you and your health needs are our priority.  As part of our continuing mission to provide you with exceptional heart care, we have created designated Provider Care Teams.  These Care Teams include your primary Cardiologist (physician) and Advanced Practice Providers (APPs -  Physician Assistants and Nurse Practitioners) who all work together to provide you with the care you need, when you need it.  We recommend signing up for the patient portal called "MyChart".  Sign up information is provided on this After Visit Summary.  MyChart is used to connect with patients for Virtual Visits (Telemedicine).  Patients are able to view lab/test results, encounter notes, upcoming appointments, etc.  Non-urgent messages can be sent to your  provider as well.   To learn more about what you can do with MyChart, go to [email or web address].    Your next appointment:   6 month(s)  Provider:   Larae Grooms, MD     Other Instructions Dr Irish Lack recommends you wear compression stockings.  You were given a prescription for these  High-Fiber Eating Plan Fiber, also called dietary fiber, is a type of carbohydrate. It is found foods such as fruits, vegetables, whole grains, and beans. A high-fiber diet can have many health benefits. Your health care provider may recommend a high-fiber diet to help: Prevent constipation. Fiber can make your bowel movements more regular. Lower your cholesterol. Relieve the following conditions: Inflammation of veins in the anus (hemorrhoids). Inflammation of specific areas of the digestive tract (uncomplicated diverticulosis). A problem of the large intestine, also called the colon, that sometimes causes pain and diarrhea (irritable bowel syndrome, or IBS). Prevent overeating as part of a weight-loss plan. Prevent heart disease, type 2 diabetes, and certain cancers. What are tips for following this plan? Reading food labels  Check the nutrition facts label on food products for the amount of dietary fiber. Choose foods that have 5 grams of fiber or more per serving. The goals for recommended daily fiber intake include: Men (age 64 or younger): 34-38 g. Men (over age 78): 28-34 g. Women (age 51 or younger): 25-28 g. Women (over age 78): 22-25 g. Your daily fiber goal is _____________ g. Shopping Choose whole fruits and vegetables instead of processed forms, such as apple juice or applesauce. Choose a wide variety of high-fiber foods such as avocados, lentils,  oats, and kidney beans. Read the nutrition facts label of the foods you choose. Be aware of foods with added fiber. These foods often have high sugar and sodium amounts per serving. Cooking Use whole-grain flour for baking and  cooking. Cook with Keech rice instead of white rice. Meal planning Start the day with a breakfast that is high in fiber, such as a cereal that contains 5 g of fiber or more per serving. Eat breads and cereals that are made with whole-grain flour instead of refined flour or white flour. Eat [place] rice, bulgur wheat, or millet instead of white rice. Use beans in place of meat in soups, salads, and pasta dishes. Be sure that half of the grains you eat each day are whole grains. General information You can get the recommended daily intake of dietary fiber by: Eating a variety of fruits, vegetables, grains, nuts, and beans. Taking a fiber supplement if you are not able to take in enough fiber in your diet. It is better to get fiber through food than from a supplement. Gradually increase how much fiber you consume. If you increase your intake of dietary fiber too quickly, you may have bloating, cramping, or gas. Drink plenty of water to help you digest fiber. Choose high-fiber snacks, such as berries, raw vegetables, nuts, and popcorn. What foods should I eat? Fruits Berries. Pears. Apples. Oranges. Avocado. Prunes and raisins. Dried figs. Vegetables Sweet potatoes. Spinach. Kale. Artichokes. Cabbage. Broccoli. Cauliflower. Green peas. Carrots. Squash. Grains Whole-grain breads. Multigrain cereal. Oats and oatmeal. Rhymes rice. Barley. Bulgur wheat. Thornton. Quinoa. Bran muffins. Popcorn. Rye wafer crackers. Meats and other proteins Navy beans, kidney beans, and pinto beans. Soybeans. Split peas. Lentils. Nuts and seeds. Dairy Fiber-fortified yogurt. Beverages Fiber-fortified soy milk. Fiber-fortified orange juice. Other foods Fiber bars. The items listed above may not be a complete list of recommended foods and beverages. Contact a dietitian for more information. What foods should I avoid? Fruits Fruit juice. Cooked, strained fruit. Vegetables Fried potatoes. Canned vegetables.  Well-cooked vegetables. Grains White bread. Pasta made with refined flour. White rice. Meats and other proteins Fatty cuts of meat. Fried chicken or fried fish. Dairy Milk. Yogurt. Cream cheese. Sour cream. Fats and oils Butters. Beverages Soft drinks. Other foods Cakes and pastries. The items listed above may not be a complete list of foods and beverages to avoid. Talk with your dietitian about what choices are best for you. Summary Fiber is a type of carbohydrate. It is found in foods such as fruits, vegetables, whole grains, and beans. A high-fiber diet has many benefits. It can help to prevent constipation, lower blood cholesterol, aid weight loss, and reduce your risk of heart disease, diabetes, and certain cancers. Increase your intake of fiber gradually. Increasing fiber too quickly may cause cramping, bloating, and gas. Drink plenty of water while you increase the amount of fiber you consume. The best sources of fiber include whole fruits and vegetables, whole grains, nuts, seeds, and beans. This information is not intended to replace advice given to you by your health care provider. Make sure you discuss any questions you have with your health care provider. Document Revised: 05/03/2019 Document Reviewed: 05/03/2019 Elsevier Patient Education  [place].

## 2022-02-26 NOTE — Telephone Encounter (Signed)
Pt on Trulicity, struggling with weight loss. Will submit PA for [facility] to see if insurance covers - Key: [id number]. Typically see 15-20% weight loss with this GLP instead. Pt has DM, most recent A1c in epic 7.3% from 2020.  Prior auth approved through 01/11/23. Called pt to discuss transitioning from Trulicity 89m to Mounjaro 524mwith monthly dose titrations. Pen devices are exactly the same, copay the same on her insurance. Reviewed injection technique and dietary tips to reduce the risk of GI upset. I will call pt monthly for dose titrations to help optimize weight loss.

## 2022-03-02 ENCOUNTER — Encounter: Payer: Self-pay | Admitting: Podiatry

## 2022-03-02 ENCOUNTER — Ambulatory Visit (INDEPENDENT_AMBULATORY_CARE_PROVIDER_SITE_OTHER): Payer: Medicare Other | Admitting: Podiatry

## 2022-03-02 DIAGNOSIS — E119 Type 2 diabetes mellitus without complications: Secondary | ICD-10-CM | POA: Diagnosis not present

## 2022-03-02 DIAGNOSIS — B351 Tinea unguium: Secondary | ICD-10-CM

## 2022-03-02 DIAGNOSIS — M79675 Pain in left toe(s): Secondary | ICD-10-CM

## 2022-03-02 DIAGNOSIS — M79674 Pain in right toe(s): Secondary | ICD-10-CM

## 2022-03-02 NOTE — Progress Notes (Signed)
This patient returns to my office for at risk foot care.  This patient requires this care by a professional since this patient will be at risk due to having diabetes and chronic kidney disease.  This patient is unable to cut nails herself since the patient cannot reach her nails .These nails are painful walking and wearing shoes.  This patient presents for at risk foot care today.  General Appearance  Alert, conversant and in no acute stress.  Vascular  Dorsalis pedis and posterior tibial  pulses are palpable  bilaterally.  Capillary return is within normal limits  bilaterally. Temperature is within normal limits  bilaterally.  Neurologic  Senn-Weinstein monofilament wire test within normal limits  bilaterally. Muscle power within normal limits bilaterally.  Nails Thick disfigured discolored nails with subungual debris  from hallux to fifth toes bilaterally. Pincer nails . No evidence of bacterial infection or drainage bilaterally.  Orthopedic  No limitations of motion  feet .  No crepitus or effusions noted.  No bony pathology or digital deformities noted.  Skin  normotropic skin with no porokeratosis noted bilaterally.  No signs of infections or ulcers noted.     Onychomycosis  Pain in right toes  Pain in left toes  Consent was obtained for treatment procedures.   Mechanical debridement of nails 1-5  bilaterally performed with a nail nipper.  Filed with dremel without incident.    Return office visit   10 weeks                 Told patient to return for periodic foot care and evaluation due to potential at risk complications.   Gardiner Barefoot DPM

## 2022-03-22 ENCOUNTER — Telehealth: Payer: Self-pay | Admitting: Pharmacist

## 2022-03-22 NOTE — Telephone Encounter (Signed)
Called pt to f/u with Mounjaro tolerability and potential dose increase. No answer, left message.

## 2022-03-23 ENCOUNTER — Other Ambulatory Visit: Payer: Self-pay | Admitting: Interventional Cardiology

## 2022-03-23 NOTE — Telephone Encounter (Signed)
Called pt again, she is tolerating '5mg'$  well and wishes to stay at this dose. Had some burning in her stomach on the first dose which resolved with subsequent doses. Some constipation as well. Less of an appetite and eating healthier foods. Hasn't weighed herself. Refill sent in, advised pt to let me know if she wants to increase the dose in the future.

## 2022-04-05 ENCOUNTER — Ambulatory Visit (HOSPITAL_COMMUNITY): Payer: Medicare Other | Attending: Interventional Cardiology

## 2022-04-05 DIAGNOSIS — R0609 Other forms of dyspnea: Secondary | ICD-10-CM | POA: Diagnosis not present

## 2022-04-05 DIAGNOSIS — I872 Venous insufficiency (chronic) (peripheral): Secondary | ICD-10-CM | POA: Insufficient documentation

## 2022-04-05 DIAGNOSIS — I10 Essential (primary) hypertension: Secondary | ICD-10-CM | POA: Insufficient documentation

## 2022-04-05 DIAGNOSIS — R600 Localized edema: Secondary | ICD-10-CM | POA: Insufficient documentation

## 2022-04-05 LAB — ECHOCARDIOGRAM COMPLETE
AR max vel: 1.61 cm2
AV Area VTI: 1.79 cm2
AV Area mean vel: 1.74 cm2
AV Mean grad: 13.7 mmHg
AV Peak grad: 24.5 mmHg
Ao pk vel: 2.47 m/s
Area-P 1/2: 4.04 cm2
MV VTI: 2.29 cm2
S' Lateral: 2.9 cm

## 2022-05-12 ENCOUNTER — Ambulatory Visit (INDEPENDENT_AMBULATORY_CARE_PROVIDER_SITE_OTHER): Payer: Medicare Other | Admitting: Podiatry

## 2022-05-12 DIAGNOSIS — M79675 Pain in left toe(s): Secondary | ICD-10-CM

## 2022-05-12 DIAGNOSIS — M79674 Pain in right toe(s): Secondary | ICD-10-CM

## 2022-05-12 DIAGNOSIS — B351 Tinea unguium: Secondary | ICD-10-CM | POA: Diagnosis not present

## 2022-05-12 DIAGNOSIS — E119 Type 2 diabetes mellitus without complications: Secondary | ICD-10-CM | POA: Diagnosis not present

## 2022-05-12 NOTE — Progress Notes (Signed)
This patient returns to my office for at risk foot care.  This patient requires this care by a professional since this patient will be at risk due to having diabetes and chronic kidney disease.  This patient is unable to cut nails herself since the patient cannot reach her nails .These nails are painful walking and wearing shoes.  This patient presents for at risk foot care today.  General Appearance  Alert, conversant and in no acute stress.  Vascular  Dorsalis pedis and posterior tibial  pulses are palpable  bilaterally.  Capillary return is within normal limits  bilaterally. Temperature is within normal limits  bilaterally.  Neurologic  Senn-Weinstein monofilament wire test within normal limits  bilaterally. Muscle power within normal limits bilaterally.  Nails Thick disfigured discolored nails with subungual debris  from hallux to fifth toes bilaterally. Pincer nails . No evidence of bacterial infection or drainage bilaterally.  Orthopedic  No limitations of motion  feet .  No crepitus or effusions noted.  No bony pathology or digital deformities noted.  Skin  normotropic skin with no porokeratosis noted bilaterally.  No signs of infections or ulcers noted.     Onychomycosis  Pain in right toes  Pain in left toes  Consent was obtained for treatment procedures.   Mechanical debridement of nails 1-5  bilaterally performed with a nail nipper.  Filed with dremel without incident.    Return office visit   10 weeks                 Told patient to return for periodic foot care and evaluation due to potential at risk complications.   Aujanae Mccullum DPM  

## 2022-05-14 ENCOUNTER — Telehealth: Payer: Self-pay | Admitting: Interventional Cardiology

## 2022-05-14 MED ORDER — MOUNJARO 7.5 MG/0.5ML ~~LOC~~ SOAJ: 7.5000 mg | SUBCUTANEOUS | 0 refills | Status: DC

## 2022-05-14 NOTE — Telephone Encounter (Signed)
Pt c/o medication issue:  1. Name of Medication:   tirzepatide [facility]) 5 MG/0.5ML Pen    2. How are you currently taking this medication (dosage and times per day)? INJECT 5 MG SUBCUTANEOUSLY WEEKLY   3. Are you having a reaction (difficulty breathing--STAT)? No  4. What is your medication issue? Pt would like a callback regarding this above medication and side effects. Please advise

## 2022-05-14 NOTE — Telephone Encounter (Signed)
Pt would like to increase Mounjaro to 7.5mg . Rx sent to [facility] on [address]

## 2022-05-29 ENCOUNTER — Other Ambulatory Visit: Payer: Self-pay | Admitting: Interventional Cardiology

## 2022-05-31 MED ORDER — MOUNJARO 10 MG/0.5ML ~~LOC~~ SOAJ: 10.0000 mg | SUBCUTANEOUS | 0 refills | Status: DC

## 2022-05-31 NOTE — Telephone Encounter (Signed)
Spoke to patient, lost almost 10 lbs so far and BG is improving scaling back on insulin dose. Tolerating Mounjaro 7.5 mg dose well wanted to up the dose to 10 mg once week. Prescription for Mounjaro 10 mg dose sent to the pharmacy.

## 2022-06-06 ENCOUNTER — Emergency Department (HOSPITAL_BASED_OUTPATIENT_CLINIC_OR_DEPARTMENT_OTHER): Payer: Medicare Other

## 2022-06-06 ENCOUNTER — Other Ambulatory Visit: Payer: Self-pay

## 2022-06-06 ENCOUNTER — Encounter (HOSPITAL_BASED_OUTPATIENT_CLINIC_OR_DEPARTMENT_OTHER): Payer: Self-pay | Admitting: Emergency Medicine

## 2022-06-06 ENCOUNTER — Inpatient Hospital Stay (HOSPITAL_BASED_OUTPATIENT_CLINIC_OR_DEPARTMENT_OTHER)
Admission: EM | Admit: 2022-06-06 | Discharge: 2022-06-11 | DRG: 193 | Disposition: A | Discharge status: Home / Self Care | Payer: Medicare Other | Attending: Internal Medicine | Admitting: Internal Medicine

## 2022-06-06 DIAGNOSIS — E1159 Type 2 diabetes mellitus with other circulatory complications: Secondary | ICD-10-CM | POA: Diagnosis present

## 2022-06-06 DIAGNOSIS — J189 Pneumonia, unspecified organism: Secondary | ICD-10-CM | POA: Diagnosis present

## 2022-06-06 DIAGNOSIS — Z881 Allergy status to other antibiotic agents status: Secondary | ICD-10-CM

## 2022-06-06 DIAGNOSIS — J181 Lobar pneumonia, unspecified organism: Secondary | ICD-10-CM | POA: Diagnosis not present

## 2022-06-06 DIAGNOSIS — F32A Depression, unspecified: Secondary | ICD-10-CM | POA: Diagnosis present

## 2022-06-06 DIAGNOSIS — K219 Gastro-esophageal reflux disease without esophagitis: Secondary | ICD-10-CM | POA: Diagnosis present

## 2022-06-06 DIAGNOSIS — E785 Hyperlipidemia, unspecified: Secondary | ICD-10-CM | POA: Diagnosis present

## 2022-06-06 DIAGNOSIS — Z90710 Acquired absence of both cervix and uterus: Secondary | ICD-10-CM

## 2022-06-06 DIAGNOSIS — Z7985 Long-term (current) use of injectable non-insulin antidiabetic drugs: Secondary | ICD-10-CM

## 2022-06-06 DIAGNOSIS — E113553 Type 2 diabetes mellitus with stable proliferative diabetic retinopathy, bilateral: Secondary | ICD-10-CM | POA: Diagnosis present

## 2022-06-06 DIAGNOSIS — Z85528 Personal history of other malignant neoplasm of kidney: Secondary | ICD-10-CM

## 2022-06-06 DIAGNOSIS — I959 Hypotension, unspecified: Secondary | ICD-10-CM | POA: Diagnosis present

## 2022-06-06 DIAGNOSIS — N1831 Chronic kidney disease, stage 3a: Secondary | ICD-10-CM | POA: Diagnosis present

## 2022-06-06 DIAGNOSIS — Z7982 Long term (current) use of aspirin: Secondary | ICD-10-CM

## 2022-06-06 DIAGNOSIS — Z794 Long term (current) use of insulin: Secondary | ICD-10-CM | POA: Diagnosis not present

## 2022-06-06 DIAGNOSIS — A419 Sepsis, unspecified organism: Secondary | ICD-10-CM

## 2022-06-06 DIAGNOSIS — Z885 Allergy status to narcotic agent status: Secondary | ICD-10-CM

## 2022-06-06 DIAGNOSIS — J9601 Acute respiratory failure with hypoxia: Secondary | ICD-10-CM | POA: Diagnosis present

## 2022-06-06 DIAGNOSIS — E1169 Type 2 diabetes mellitus with other specified complication: Secondary | ICD-10-CM | POA: Diagnosis present

## 2022-06-06 DIAGNOSIS — F418 Other specified anxiety disorders: Secondary | ICD-10-CM | POA: Diagnosis present

## 2022-06-06 DIAGNOSIS — Z9049 Acquired absence of other specified parts of digestive tract: Secondary | ICD-10-CM

## 2022-06-06 DIAGNOSIS — J811 Chronic pulmonary edema: Secondary | ICD-10-CM | POA: Diagnosis present

## 2022-06-06 DIAGNOSIS — I152 Hypertension secondary to endocrine disorders: Secondary | ICD-10-CM | POA: Diagnosis present

## 2022-06-06 DIAGNOSIS — Z7984 Long term (current) use of oral hypoglycemic drugs: Secondary | ICD-10-CM | POA: Diagnosis not present

## 2022-06-06 DIAGNOSIS — G4733 Obstructive sleep apnea (adult) (pediatric): Secondary | ICD-10-CM | POA: Diagnosis present

## 2022-06-06 DIAGNOSIS — Z888 Allergy status to other drugs, medicaments and biological substances status: Secondary | ICD-10-CM

## 2022-06-06 DIAGNOSIS — E1122 Type 2 diabetes mellitus with diabetic chronic kidney disease: Secondary | ICD-10-CM | POA: Diagnosis present

## 2022-06-06 DIAGNOSIS — E1165 Type 2 diabetes mellitus with hyperglycemia: Secondary | ICD-10-CM | POA: Diagnosis present

## 2022-06-06 DIAGNOSIS — Z1152 Encounter for screening for COVID-19: Secondary | ICD-10-CM | POA: Diagnosis not present

## 2022-06-06 DIAGNOSIS — Z79890 Hormone replacement therapy (postmenopausal): Secondary | ICD-10-CM | POA: Diagnosis not present

## 2022-06-06 DIAGNOSIS — N179 Acute kidney failure, unspecified: Secondary | ICD-10-CM | POA: Diagnosis present

## 2022-06-06 DIAGNOSIS — Z9989 Dependence on other enabling machines and devices: Secondary | ICD-10-CM

## 2022-06-06 DIAGNOSIS — K5900 Constipation, unspecified: Secondary | ICD-10-CM | POA: Diagnosis present

## 2022-06-06 DIAGNOSIS — Z79899 Other long term (current) drug therapy: Secondary | ICD-10-CM

## 2022-06-06 DIAGNOSIS — E039 Hypothyroidism, unspecified: Secondary | ICD-10-CM | POA: Diagnosis present

## 2022-06-06 DIAGNOSIS — N189 Chronic kidney disease, unspecified: Secondary | ICD-10-CM | POA: Diagnosis present

## 2022-06-06 DIAGNOSIS — Z882 Allergy status to sulfonamides status: Secondary | ICD-10-CM

## 2022-06-06 DIAGNOSIS — Z880 Allergy status to penicillin: Secondary | ICD-10-CM

## 2022-06-06 DIAGNOSIS — J45909 Unspecified asthma, uncomplicated: Secondary | ICD-10-CM | POA: Diagnosis present

## 2022-06-06 LAB — CBC
HCT: 35.5 % — ABNORMAL LOW (ref 36.0–46.0)
Hemoglobin: 11.2 g/dL — ABNORMAL LOW (ref 12.0–15.0)
MCH: 29.4 pg (ref 26.0–34.0)
MCHC: 31.5 g/dL (ref 30.0–36.0)
MCV: 93.2 fL (ref 80.0–100.0)
Platelets: 143 10*3/uL — ABNORMAL LOW (ref 150–400)
RBC: 3.81 MIL/uL — ABNORMAL LOW (ref 3.87–5.11)
RDW: 14.9 % (ref 11.5–15.5)
WBC: 7.4 10*3/uL (ref 4.0–10.5)
nRBC: 0 % (ref 0.0–0.2)

## 2022-06-06 LAB — URINALYSIS, ROUTINE W REFLEX MICROSCOPIC
Bilirubin Urine: NEGATIVE
Glucose, UA: 500 mg/dL — AB
Hgb urine dipstick: NEGATIVE
Ketones, ur: NEGATIVE mg/dL
Leukocytes,Ua: NEGATIVE
Nitrite: NEGATIVE
Specific Gravity, Urine: 1.016 (ref 1.005–1.030)
pH: 5 (ref 5.0–8.0)

## 2022-06-06 LAB — BASIC METABOLIC PANEL
Anion gap: 8 (ref 5–15)
BUN: 42 mg/dL — ABNORMAL HIGH (ref 8–23)
CO2: 26 mmol/L (ref 22–32)
Calcium: 8.4 mg/dL — ABNORMAL LOW (ref 8.9–10.3)
Chloride: 104 mmol/L (ref 98–111)
Creatinine, Ser: 1.86 mg/dL — ABNORMAL HIGH (ref 0.44–1.00)
GFR, Estimated: 28 mL/min — ABNORMAL LOW (ref >60)
Glucose, Bld: 137 mg/dL — ABNORMAL HIGH (ref 70–99)
Potassium: 3.5 mmol/L (ref 3.5–5.1)
Sodium: 138 mmol/L (ref 135–145)

## 2022-06-06 LAB — CREATININE, SERUM
Creatinine, Ser: 1.85 mg/dL — ABNORMAL HIGH (ref 0.44–1.00)
GFR, Estimated: 28 mL/min — ABNORMAL LOW (ref >60)

## 2022-06-06 LAB — COMPREHENSIVE METABOLIC PANEL
ALT: 8 U/L (ref 0–44)
AST: 11 U/L — ABNORMAL LOW (ref 15–41)
Albumin: 3.6 g/dL (ref 3.5–5.0)
Alkaline Phosphatase: 60 U/L (ref 38–126)
Anion gap: 7 (ref 5–15)
BUN: 45 mg/dL — ABNORMAL HIGH (ref 8–23)
CO2: 34 mmol/L — ABNORMAL HIGH (ref 22–32)
Calcium: 9.1 mg/dL (ref 8.9–10.3)
Chloride: 97 mmol/L — ABNORMAL LOW (ref 98–111)
Creatinine, Ser: 2.06 mg/dL — ABNORMAL HIGH (ref 0.44–1.00)
GFR, Estimated: 25 mL/min — ABNORMAL LOW (ref >60)
Glucose, Bld: 133 mg/dL — ABNORMAL HIGH (ref 70–99)
Potassium: 4 mmol/L (ref 3.5–5.1)
Sodium: 138 mmol/L (ref 135–145)
Total Bilirubin: 0.9 mg/dL (ref 0.3–1.2)
Total Protein: 7 g/dL (ref 6.5–8.1)

## 2022-06-06 LAB — CBC WITH DIFFERENTIAL/PLATELET
Abs Immature Granulocytes: 0.02 10*3/uL (ref 0.00–0.07)
Basophils Absolute: 0 10*3/uL (ref 0.0–0.1)
Basophils Relative: 0 %
Eosinophils Absolute: 0.1 10*3/uL (ref 0.0–0.5)
Eosinophils Relative: 1 %
HCT: 34.9 % — ABNORMAL LOW (ref 36.0–46.0)
Hemoglobin: 11.2 g/dL — ABNORMAL LOW (ref 12.0–15.0)
Immature Granulocytes: 0 %
Lymphocytes Relative: 26 %
Lymphs Abs: 2.3 10*3/uL (ref 0.7–4.0)
MCH: 29.2 pg (ref 26.0–34.0)
MCHC: 32.1 g/dL (ref 30.0–36.0)
MCV: 90.9 fL (ref 80.0–100.0)
Monocytes Absolute: 0.9 10*3/uL (ref 0.1–1.0)
Monocytes Relative: 10 %
Neutro Abs: 5.5 10*3/uL (ref 1.7–7.7)
Neutrophils Relative %: 63 %
Platelets: 150 10*3/uL (ref 150–400)
RBC: 3.84 MIL/uL — ABNORMAL LOW (ref 3.87–5.11)
RDW: 15.1 % (ref 11.5–15.5)
WBC: 8.8 10*3/uL (ref 4.0–10.5)
nRBC: 0 % (ref 0.0–0.2)

## 2022-06-06 LAB — LACTIC ACID, PLASMA: Lactic Acid, Venous: 0.7 mmol/L (ref 0.5–1.9)

## 2022-06-06 LAB — SARS CORONAVIRUS 2 BY RT PCR: SARS Coronavirus 2 by RT PCR: NEGATIVE

## 2022-06-06 LAB — MRSA NEXT GEN BY PCR, NASAL: MRSA by PCR Next Gen: NOT DETECTED

## 2022-06-06 LAB — LIPASE, BLOOD: Lipase: 72 U/L — ABNORMAL HIGH (ref 11–51)

## 2022-06-06 LAB — CULTURE, BLOOD (ROUTINE X 2)

## 2022-06-06 LAB — GLUCOSE, CAPILLARY: Glucose-Capillary: 133 mg/dL — ABNORMAL HIGH (ref 70–99)

## 2022-06-06 MED ORDER — ALPRAZOLAM 0.25 MG PO TABS
0.1250 mg | ORAL_TABLET | Freq: Three times a day (TID) | ORAL | Status: DC | PRN
  Administered 2022-06-06 – 2022-06-10 (×6): 0.25 mg via ORAL
  Filled 2022-06-06 (×6): qty 1

## 2022-06-06 MED ORDER — PANTOPRAZOLE SODIUM 20 MG PO TBEC
20.0000 mg | DELAYED_RELEASE_TABLET | Freq: Two times a day (BID) | ORAL | Status: DC
  Administered 2022-06-06 – 2022-06-11 (×10): 20 mg via ORAL
  Filled 2022-06-06 (×11): qty 1

## 2022-06-06 MED ORDER — SODIUM CHLORIDE 0.9 % IV SOLN
100.0000 mg | Freq: Once | INTRAVENOUS | Status: AC
  Administered 2022-06-06: 100 mg via INTRAVENOUS
  Filled 2022-06-06: qty 100

## 2022-06-06 MED ORDER — METOPROLOL SUCCINATE ER 50 MG PO TB24
50.0000 mg | ORAL_TABLET | Freq: Every day | ORAL | Status: DC
  Administered 2022-06-07 – 2022-06-11 (×5): 50 mg via ORAL
  Filled 2022-06-06 (×5): qty 1

## 2022-06-06 MED ORDER — METRONIDAZOLE 500 MG/100ML IV SOLN
500.0000 mg | Freq: Once | INTRAVENOUS | Status: AC
  Administered 2022-06-06: 500 mg via INTRAVENOUS
  Filled 2022-06-06: qty 100

## 2022-06-06 MED ORDER — SENNOSIDES-DOCUSATE SODIUM 8.6-50 MG PO TABS: 1.0000 | ORAL_TABLET | Freq: Every evening | ORAL | Status: DC | PRN

## 2022-06-06 MED ORDER — INSULIN ASPART 100 UNIT/ML IJ SOLN
0.0000 [IU] | Freq: Three times a day (TID) | INTRAMUSCULAR | Status: DC
  Administered 2022-06-07 – 2022-06-11 (×3): 2 [IU] via SUBCUTANEOUS

## 2022-06-06 MED ORDER — VANCOMYCIN HCL IN DEXTROSE 1-5 GM/200ML-% IV SOLN: 1000.0000 mg | Freq: Once | INTRAVENOUS | Status: DC

## 2022-06-06 MED ORDER — ENOXAPARIN SODIUM 40 MG/0.4ML IJ SOSY
40.0000 mg | PREFILLED_SYRINGE | INTRAMUSCULAR | Status: DC
  Administered 2022-06-07 – 2022-06-11 (×5): 40 mg via SUBCUTANEOUS
  Filled 2022-06-06 (×5): qty 0.4

## 2022-06-06 MED ORDER — SODIUM CHLORIDE 0.9 % IV SOLN
2.0000 g | Freq: Once | INTRAVENOUS | Status: AC
  Administered 2022-06-06: 2 g via INTRAVENOUS
  Filled 2022-06-06: qty 12.5

## 2022-06-06 MED ORDER — SODIUM CHLORIDE 0.9 % IV SOLN: 2.0000 g | Freq: Two times a day (BID) | INTRAVENOUS | Status: DC

## 2022-06-06 MED ORDER — INSULIN ASPART 100 UNIT/ML IJ SOLN: 0.0000 [IU] | Freq: Every day | INTRAMUSCULAR | Status: DC

## 2022-06-06 MED ORDER — ACETAMINOPHEN 650 MG RE SUPP: 650.0000 mg | Freq: Four times a day (QID) | RECTAL | Status: DC | PRN

## 2022-06-06 MED ORDER — ALBUTEROL SULFATE (2.5 MG/3ML) 0.083% IN NEBU: 2.5000 mg | INHALATION_SOLUTION | Freq: Four times a day (QID) | RESPIRATORY_TRACT | Status: DC | PRN

## 2022-06-06 MED ORDER — SODIUM CHLORIDE 0.9 % IV BOLUS
1000.0000 mL | Freq: Once | INTRAVENOUS | Status: AC
  Administered 2022-06-06: 1000 mL via INTRAVENOUS

## 2022-06-06 MED ORDER — GUAIFENESIN ER 600 MG PO TB12: 600.0000 mg | ORAL_TABLET | Freq: Two times a day (BID) | ORAL | Status: DC | PRN

## 2022-06-06 MED ORDER — ACETAMINOPHEN 500 MG PO TABS
1000.0000 mg | ORAL_TABLET | Freq: Once | ORAL | Status: AC
  Administered 2022-06-06: 1000 mg via ORAL
  Filled 2022-06-06: qty 2

## 2022-06-06 MED ORDER — ATORVASTATIN CALCIUM 40 MG PO TABS
40.0000 mg | ORAL_TABLET | Freq: Every day | ORAL | Status: DC
  Administered 2022-06-06 – 2022-06-10 (×5): 40 mg via ORAL
  Filled 2022-06-06 (×5): qty 1

## 2022-06-06 MED ORDER — SODIUM CHLORIDE 0.9 % IV SOLN
2.0000 g | INTRAVENOUS | Status: AC
  Administered 2022-06-07 – 2022-06-11 (×5): 2 g via INTRAVENOUS
  Filled 2022-06-06 (×5): qty 20

## 2022-06-06 MED ORDER — ONDANSETRON HCL 4 MG/2ML IJ SOLN: 4.0000 mg | Freq: Four times a day (QID) | INTRAMUSCULAR | Status: DC | PRN

## 2022-06-06 MED ORDER — ONDANSETRON HCL 4 MG PO TABS: 4.0000 mg | ORAL_TABLET | Freq: Four times a day (QID) | ORAL | Status: DC | PRN

## 2022-06-06 MED ORDER — SODIUM CHLORIDE 0.9 % IV SOLN: INTRAVENOUS | Status: AC

## 2022-06-06 MED ORDER — ACETAMINOPHEN 325 MG PO TABS
650.0000 mg | ORAL_TABLET | Freq: Four times a day (QID) | ORAL | Status: DC | PRN
  Administered 2022-06-07: 650 mg via ORAL
  Filled 2022-06-06: qty 2

## 2022-06-06 MED ORDER — DOXYCYCLINE HYCLATE 100 MG PO TABS
100.0000 mg | ORAL_TABLET | Freq: Two times a day (BID) | ORAL | Status: DC
  Administered 2022-06-07 – 2022-06-11 (×9): 100 mg via ORAL
  Filled 2022-06-06 (×9): qty 1

## 2022-06-06 MED ORDER — SODIUM CHLORIDE 0.9% FLUSH
3.0000 mL | Freq: Two times a day (BID) | INTRAVENOUS | Status: DC
  Administered 2022-06-06 – 2022-06-10 (×7): 3 mL via INTRAVENOUS

## 2022-06-06 MED ORDER — LEVOTHYROXINE SODIUM 100 MCG PO TABS
200.0000 ug | ORAL_TABLET | Freq: Every day | ORAL | Status: DC
  Administered 2022-06-07 – 2022-06-11 (×5): 200 ug via ORAL
  Filled 2022-06-06 (×5): qty 2

## 2022-06-06 MED ORDER — TRAMADOL HCL 50 MG PO TABS
50.0000 mg | ORAL_TABLET | Freq: Every day | ORAL | Status: DC | PRN
  Administered 2022-06-09: 50 mg via ORAL
  Filled 2022-06-06: qty 1

## 2022-06-06 NOTE — Progress Notes (Signed)
Pharmacy Antibiotic Note  [name] is a 77 y.o. female for which pharmacy has been consulted for cefepime dosing for sepsis. Patient with a history of CKD, DM, HLD, HTN, GERD.  SCr 2.06 WBC 8.8; LA 0.7; T 100.5>98.6; HR 72; RR 18  Plan: Cefepime 2g q12hr  Flagyl & Doxycycline per MD Trend WBC, Fever, Renal function F/u cultures, clinical course, WBC De-escalate when able     Temp (24hrs), Avg:100.5 F (38.1 C), Min:100.5 F (38.1 C), Max:100.5 F (38.1 C)  No results for input(s): "WBC", "CREATININE", "LATICACIDVEN", "VANCOTROUGH", "VANCOPEAK", "VANCORANDOM", "GENTTROUGH", "GENTPEAK", "GENTRANDOM", "TOBRATROUGH", "TOBRAPEAK", "TOBRARND", "AMIKACINPEAK", "AMIKACINTROU", "AMIKACIN" in the last 168 hours.  CrCl cannot be calculated (Patient's most recent lab result is older than the maximum 21 days allowed.).    Allergies  Allergen Reactions   Esomeprazole Magnesium Shortness Of Breath   Aliskiren Other (See Comments)    Lips tingling   Augmentin [Amoxicillin-Pot Clavulanate]     Has patient had a PCN reaction causing immediate rash, facial/tongue/throat swelling, SOB or lightheadedness with hypotension: Unknown Has patient had a PCN reaction causing severe rash involving mucus membranes or skin necrosis: Unknown Has patient had a PCN reaction that required hospitalization:Unknown Has patient had a PCN reaction occurring within the last 10 years:Unknown If all of the above answers are "NO", then may proceed with Cephalosporin use.    Avelox [Moxifloxacin Hcl In Nacl] Nausea Only   Azithromycin Other (See Comments)   Benzonatate Nausea And Vomiting   Codeine Other (See Comments)   Demerol [Meperidine] Other (See Comments)    Hallucination, pass out   Duloxetine Other (See Comments)    CNS effects   Erythromycin Base Other (See Comments)   Exenatide Other (See Comments)    Reflux   Keflex [Cephalexin] Nausea Only   Levofloxacin Nausea Only   Lisinopril Cough    Metformin Diarrhea   Nifedipine Other (See Comments)    Cardiac Awareness   Nitrofurantoin Macrocrystal Other (See Comments)    Not listed   Paroxetine Hcl Other (See Comments)    constipation   Rosiglitazone Nausea Only   Sitagliptin Nausea Only and Other (See Comments)    Loose stools   Spironolactone Nausea Only    "lips pulling"   Sulfamethoxazole Other (See Comments)    Not listed   Microbiology results: Pending  Thank you for allowing pharmacy to be a part of this patient's care.  Delmar Landau, PharmD, BCPS 06/06/2022 3:34 PM ED Clinical Pharmacist -  [phone]

## 2022-06-06 NOTE — ED Notes (Signed)
Called floor to give report. Was asked to call back in 5 minutes by RN receiving Pt care. Did advise Pt is on the way.

## 2022-06-06 NOTE — ED Notes (Signed)
Due to poor vein selection second blood cultures were unable to be drawn.

## 2022-06-06 NOTE — Assessment & Plan Note (Signed)
-   Continue home Xanax as needed 

## 2022-06-06 NOTE — ED Notes (Signed)
Pt was placed on 2 lpm [place] due to oxygen sat. on RA is reading 88%. Oxygen sat increased to 97% with O2.

## 2022-06-06 NOTE — Assessment & Plan Note (Signed)
She was hypotensive in the ED, normotensive on transfer to the floor.  Will resume Toprol-XL tomorrow otherwise hold her home antihypertensives for now.

## 2022-06-06 NOTE — Assessment & Plan Note (Signed)
Seen on CT imaging, focal airspace consolidation versus ground-glass pulmonary mass.  Treating empirically for pneumonia. -Continue IV ceftriaxone and azithromycin -Follow blood cultures, urine strep pneumonia -Recommend follow-up imaging in 4-6 weeks

## 2022-06-06 NOTE — Assessment & Plan Note (Signed)
Holding [place].  Placed on SSI.

## 2022-06-06 NOTE — Assessment & Plan Note (Signed)
In setting of acute illness and decreased oral intake plus medication effect. -Continue gentle IV fluid hydration overnight -Hold candesartan, Farxiga, HCTZ -Repeat labs in a.m.

## 2022-06-06 NOTE — Hospital Course (Signed)
[name] is a 77 y.o. female with medical history significant for insulin-dependent T2DM, CKD stage IIIa-b, HTN, HLD, hypothyroidism, asthma, right renal cell cancer s/p cryoablation, asthma, anxiety, OSA on CPAP who who is admitted with right upper lobe pneumonia.

## 2022-06-06 NOTE — H&P (Signed)
History and Physical    [name] ZOX:[id number] DOB: Jul 14, 1945 DOA: 06/06/2022  PCP: Georgann Housekeeper, MD  Patient coming from: Home  I have personally briefly reviewed patient's old medical records in [facility]  Chief Complaint: cough, nasal congestion  HPI: [name] is a 77 y.o. female with medical history significant for insulin-dependent T2DM, CKD stage IIIa-b, HTN, HLD, hypothyroidism, asthma, right renal cell cancer s/p cryoablation, asthma, anxiety, OSA on CPAP who presented to the ED for evaluation of cough.  Patient reports 3 days of new cough productive of white and occasionally green sputum.  She denies any significant fevers, chills, diaphoresis, dyspnea, chest pain.  She has not had any abdominal pain or diarrhea.  She reports constipation instead.  She has not had any nausea or vomiting.  She has had decreased oral intake.  She thinks urine output is decreased.  She has not had dysuria.  She denies any peripheral edema.  She says her PCP prescribed her amoxicillin which she has been taking but due to persistent symptoms she came to the ED for further evaluation.  [facility] ED Course  Labs/Imaging on admission: I have personally reviewed following labs and imaging studies.  Initial vitals showed BP 99/45, pulse 72, RR 18, temp 100.5 F, SpO2 90% on room air.  Patient placed on 2 L via [place] with improvement to 100%.  Labs show WBC 8.8, hemoglobin 11.2, platelets 150,000, sodium 138, potassium 4.0, bicarb 34, BUN 45, creatinine 2.06, serum glucose 133, AST 11, ALT 8, alk phos 62, T. bili 0.9, lipase 72.  SARS-CoV-2 PCR negative.  Lactic acid 0.7.  Urinalysis shows negative nitrites, negative leukocytes, 0-5 RBCs and WBCs, rare bacteria microscopy.  Blood and urine cultures pending.  CT chest/abdomen/pelvis without contrast showed a focal airspace consolidation versus groundglass pulmonary mass measuring 4.5 x 2.9 cm in the central right upper lobe.   Mild groundglass opacities in the left upper lobe centrally also noted.  Partially calcified right renal mass post cryoablation measures 2.6 x 2.3 cm.  Patient was given 1 L normal saline, IV cefepime, Flagyl, doxycycline.  The hospitalist service was consulted to admit for further evaluation and management.  Review of Systems: All systems reviewed and are negative except as documented in history of present illness above.   Past Medical History:  Diagnosis Date   Anxiety    Arthritis    Asthma    Diabetes mellitus    GERD (gastroesophageal reflux disease)    Hyperlipidemia    Hypertension    Hypothyroid    Sleep apnea    use CPAP nightly    Past Surgical History:  Procedure Laterality Date   ABDOMINAL HYSTERECTOMY     CHOLECYSTECTOMY     COLONOSCOPY WITH PROPOFOL N/A 08/29/2012   Procedure: COLONOSCOPY WITH PROPOFOL;  Surgeon: Charolett Bumpers, MD;  Location: [facility] ENDOSCOPY;  Service: Endoscopy;  Laterality: N/A;   EYE SURGERY     IR GENERIC HISTORICAL  01/14/2016   IR RADIOLOGIST EVAL & MGMT 01/14/2016 [facility] INTERV RAD    Social History:  reports that she has never smoked. She has never used smokeless tobacco. She reports that she does not drink alcohol and does not use drugs.  Allergies  Allergen Reactions   Esomeprazole Magnesium Shortness Of Breath   Aliskiren Other (See Comments)    Lips tingling   Augmentin [Amoxicillin-Pot Clavulanate]     Has patient had a PCN reaction causing immediate rash, facial/tongue/throat swelling, SOB or lightheadedness  with hypotension: Unknown Has patient had a PCN reaction causing severe rash involving mucus membranes or skin necrosis: Unknown Has patient had a PCN reaction that required hospitalization:Unknown Has patient had a PCN reaction occurring within the last 10 years:Unknown If all of the above answers are "NO", then may proceed with Cephalosporin use.    Avelox [Moxifloxacin Hcl In Nacl] Nausea Only   Azithromycin Other (See  Comments)   Benzonatate Nausea And Vomiting   Codeine Other (See Comments)   Demerol [Meperidine] Other (See Comments)    Hallucination, pass out   Duloxetine Other (See Comments)    CNS effects   Erythromycin Base Other (See Comments)   Exenatide Other (See Comments)    Reflux   Keflex [Cephalexin] Nausea Only   Levofloxacin Nausea Only   Lisinopril Cough   Metformin Diarrhea   Nifedipine Other (See Comments)    Cardiac Awareness   Nitrofurantoin Macrocrystal Other (See Comments)    Not listed   Paroxetine Hcl Other (See Comments)    constipation   Rosiglitazone Nausea Only   Sitagliptin Nausea Only and Other (See Comments)    Loose stools   Spironolactone Nausea Only    "lips pulling"   Sulfamethoxazole Other (See Comments)    Not listed    Family History  Adopted: Yes     Prior to Admission medications   Medication Sig Start Date End Date Taking? Authorizing Provider  Accu-Chek FastClix Lancets MISC USE TO TEST BLOOD SUGARS AT LEAST 5 TIMES A DAY (E11.65) 03/26/19   [provider]  albuterol (VENTOLIN HFA) 108 (90 Base) MCG/ACT inhaler Ventolin HFA 90 mcg/actuation aerosol inhaler  2 PUFFS AS NEEDED EVERY 6 HRS INHALATION 30 DAYS    [provider]  ALPRAZolam (XANAX) 0.25 MG tablet Take 0.125-0.25 mg by mouth 3 (three) times daily as needed for anxiety.     [provider]  amLODipine (NORVASC) 5 MG tablet Take 5 mg by mouth at bedtime.    [provider]  aspirin EC 81 MG tablet Take 81 mg by mouth daily.    [provider]  atorvastatin (LIPITOR) 40 MG tablet Take 40 mg by mouth at bedtime.    [provider]  bacitracin ophthalmic ointment bacitracin 500 unit/gram eye ointment  APPLY A SMALL AMOUNT TO BOTH EYES NIGHTLY, START 1 WEEK PRIOR TO SURGERY    [provider]  brimonidine (ALPHAGAN) 0.2 % ophthalmic solution     [provider]  buPROPion (WELLBUTRIN XL) 150 MG 24 hr tablet Take 150  mg by mouth every morning. 05/10/19   [provider]  busPIRone (BUSPAR) 5 MG tablet Take 5 mg by mouth 2 (two) times daily. 09/13/17   [provider]  candesartan (ATACAND) 32 MG tablet Take 32 mg by mouth at bedtime.    [provider]  chlorpheniramine-HYDROcodone (TUSSIONEX) 10-8 MG/5ML SUER hydrocodone 10 mg-chlorpheniramine 8 mg/5 mL oral susp extend.rel 12hr  TAKE 5 ML AS NEEDED AT BEDTIME ORALLY 10 DAYS    [provider]  cloNIDine (CATAPRES - DOSED IN MG/24 HR) 0.2 mg/24hr patch APPLY 1 PATCH TOPICALLY TO  SKIN ONCE A WEEK 02/03/18   [provider]  cyclopentolate (CYCLODRYL,CYCLOGYL) 2 % ophthalmic solution     [provider]  doxazosin (CARDURA) 8 MG tablet TAKE 1/2 TABLET BY MOUTH ONCE A DAY 06/12/18   [provider]  FARXIGA 10 MG TABS tablet 1 tablet Orally Once a day 05/21/20   [provider]  fluconazole (DIFLUCAN) 150 MG tablet fluconazole 150 mg tablet  TAKE FIRST TABLET ON DAY 1 FOLLOWED BY SECOND TABLET IN 3-4- DAYS = FOR YEAST INFECTION    [provider]  fluticasone (FLONASE) 50 MCG/ACT nasal spray fluticasone propionate 50 mcg/actuation nasal spray,suspension  2 SPRAY IN EACH NOSTRIL ONCE A DAY    [provider]  gabapentin (NEURONTIN) 300 MG capsule Take 300 mg by mouth 3 (three) times daily. 03/15/19   [provider]  glucose blood test strip Accu-Chek Aviva Plus test strips  USE AS DIRECTED. TESTING FREQUENCY: 5X/DAILY.    [provider]  hydrochlorothiazide (HYDRODIURIL) 25 MG tablet TAKE 2 TABLETS BY MOUTH EVERY DAY 12/29/17   [provider]  insulin glargine (LANTUS) 100 UNIT/ML injection Inject into the skin. 06/19/18   [provider]  insulin lispro (HUMALOG) 100 UNIT/ML KwikPen INJECT SUBCUTANEOUSLY 15  UNITS 3 TIMES DAILY BEFORE  [facility] MEAL 04/19/18   [provider]  Insulin Pen Needle (B-D ULTRAFINE III SHORT PEN) 31G X 8 MM MISC  USE TO INJECT INSULIN 3  TIMES DAILY AS DIRECTED 04/19/18   [provider]  Insulin Syringe-Needle U-100 (BD SAFETYGLIDE INSULIN SYRINGE) 31G X 15/64" 1 ML MISC 1 Syringe by Misc.(Non-Drug; Combo Route) route 2 times daily. 03/15/19   [provider]  ketoconazole (NIZORAL) 2 % cream APPLY TO AFFECTED AREA EVERY DAY AS NEEDED FOR REDNESS AND ITCHING 06/16/17   [provider]  levothyroxine (SYNTHROID) 200 MCG tablet TAKE 1 TABLET (200 MCG TOTAL) BY MOUTH DAILY BEFORE BREAKFAST. (DAW) 02/14/18   [provider]  lidocaine (LIDODERM) 5 % Place 1 patch onto the skin daily. Remove & Discard patch within 12 hours or as directed by MD 02/08/22   Glynn Octave, MD  metoprolol succinate (TOPROL-XL) 50 MG 24 hr tablet Take 1 tablet by mouth daily. 05/02/19   [provider]  ondansetron (ZOFRAN) 4 MG tablet ondansetron HCl 4 mg tablet  TAKE 1 TABLET BY MOUTH AS NEEDED FOR NAUSEA    [provider]  pantoprazole (PROTONIX) 20 MG tablet pantoprazole 20 mg tablet,delayed release  TAKE 1 TABLET BY MOUTH 2 TIMES A DAY 90 DAYS    [provider]  tirzepatide Greggory Keen) 10 MG/0.5ML Pen Inject 10 mg into the skin once a week. 05/31/22   Corky Crafts, MD  traMADol (ULTRAM) 50 MG tablet Take 50 mg by mouth daily as needed.    [provider]  triamcinolone cream (KENALOG) 0.1 % PLEASE SEE ATTACHED FOR DETAILED DIRECTIONS 04/03/19   [provider]  valACYclovir (VALTREX) 500 MG tablet ONE TWICE A DAY FOR 7 DAYS PRN FOR HERPES OUTBREAK 04/22/17   [provider]  Vitamin D, Ergocalciferol, (DRISDOL) 50000 UNITS CAPS Take 50,000 Units by mouth every 7 (seven) days. Thursdays    [provider]    Physical Exam: Vitals:   06/06/22 1630 06/06/22 1854 06/06/22 2014 06/06/22 2048  BP: (!) 101/50   114/62  Pulse: 67   73  Resp: 14   (!) 21  Temp:   98.9 F (37.2 C) 98.6 F (37 C)  TempSrc:   Oral Oral  SpO2: 100%    96%  Weight:  (!) 139.2 kg  (!) 139.3 kg  Height:    5\' 8"  (1.727 m)   Constitutional: Resting in bed, NAD, calm, comfortable Eyes: EOMI, lids and conjunctivae normal ENMT: Mucous membranes are moist. Posterior pharynx clear of any exudate or lesions.Normal dentition.  Neck:  normal, supple, no masses. Respiratory: clear to auscultation bilaterally, no wheezing, no crackles. Normal respiratory effort. No accessory muscle use.  Cardiovascular: Regular rate and rhythm, no murmurs / rubs / gallops. No extremity edema. 2+ pedal pulses. Abdomen: no tenderness, no masses palpated.  Musculoskeletal: no clubbing / cyanosis. No joint deformity upper and lower extremities. Good ROM, no contractures. Normal muscle tone.  Skin: no rashes, lesions, ulcers. No induration Neurologic: Sensation intact. Strength 5/5 in all 4.  Psychiatric:  Alert and oriented x 3. Normal mood.   EKG: Personally reviewed. Sinus rhythm, rate 70, PACs, no acute ischemic changes.  Assessment/Plan Principal Problem:   Right upper lobe consolidation (HCC) Active Problems:   Acute kidney injury superimposed on chronic kidney disease stage IIIa (HCC)   Hypertension associated with diabetes (HCC)   Controlled type 2 diabetes mellitus with stable proliferative retinopathy of both eyes, with long-term current use of insulin (HCC)   Hypothyroidism   Hyperlipidemia associated with type 2 diabetes mellitus (HCC)   Depression with anxiety   OSA (obstructive sleep apnea)   [name] is a 77 y.o. female with medical history significant for insulin-dependent T2DM, CKD stage IIIa-b, HTN, HLD, hypothyroidism, asthma, right renal cell cancer s/p cryoablation, asthma, anxiety, OSA on CPAP who who is admitted with right upper lobe pneumonia.  Assessment and Plan: * Right upper lobe consolidation (HCC) Seen on CT imaging, focal airspace consolidation versus ground-glass pulmonary mass.  Treating empirically for pneumonia. -Continue  IV ceftriaxone and azithromycin -Follow blood cultures, urine strep pneumonia -Recommend follow-up imaging in 4-6 weeks  Acute kidney injury superimposed on chronic kidney disease stage IIIa (HCC) In setting of acute illness and decreased oral intake plus medication effect. -Continue gentle IV fluid hydration overnight -Hold candesartan, Farxiga, HCTZ -Repeat labs in a.m.  Hypertension associated with diabetes (HCC) She was hypotensive in the ED, normotensive on transfer to the floor.  Will resume Toprol-XL tomorrow otherwise hold her home antihypertensives for now.  Controlled type 2 diabetes mellitus with stable proliferative retinopathy of both eyes, with long-term current use of insulin (HCC) Holding [place].  Placed on SSI.  OSA (obstructive sleep apnea) Continue CPAP nightly.  Patient admits she does not use consistently at home.  Depression with anxiety Continue home Xanax as needed.  Hyperlipidemia associated with type 2 diabetes mellitus (HCC) Continue atorvastatin.  Hypothyroidism Continue Synthroid.  DVT prophylaxis: enoxaparin (LOVENOX) injection 40 mg Start: 06/07/22 1000 Code Status: Full code, confirmed with patient on admission Family Communication: Son at bedside Disposition Plan: From home and likely discharge to home pending clinical progress Consults called: None Severity of Illness: The appropriate patient status for this patient is INPATIENT. Inpatient status is judged to be reasonable and necessary in order to provide the required intensity of service to ensure the patient's safety. The patient's presenting symptoms, physical exam findings, and initial radiographic and laboratory data in the context of their chronic comorbidities is felt to place them at high risk for further clinical deterioration. Furthermore, it is not anticipated that the patient will be medically stable for discharge from the hospital within 2 midnights of admission.   * I certify that  at the point of admission it is my clinical judgment that the patient will require inpatient hospital care spanning beyond 2 midnights from the point of admission due to high intensity of service, high risk for further deterioration and high frequency of surveillance required.Darreld Mclean MD [facility]  If 7PM-7AM, please contact night-coverage [email or web address]  06/06/2022, 10:20 PM

## 2022-06-06 NOTE — Assessment & Plan Note (Signed)
Continue atorvastatin

## 2022-06-06 NOTE — ED Triage Notes (Signed)
Diarrhea,coughing,runny nose.very tired for 3 days.

## 2022-06-06 NOTE — ED Provider Notes (Signed)
[place] EMERGENCY DEPARTMENT AT [facility] Provider Note   CSN: [id number] Arrival date & time: 06/06/22  1411     History  No chief complaint on file.  HPI [name] is a 77 y.o. female with CKD, diabetes, hyperlipidemia, hypertension and GERD presenting for abdominal pain, diarrhea cough and nasal congestion.  Symptoms started about 3 days ago.  She has felt warm but is not sure she had a fever at home.  States cough is productive with white in times blackish sputum.  But states she is sure it is not blood.  Denies vomiting.  Diarrhea is nonbloody.  States she has been on amoxicillin for the last few days because of her symptoms which was started by her PCP.  States at that time she did have a sore throat but no longer has a sore throat.  Denies shortness of breath and chest pain.  Abdominal pain is in the left side of her abdomen.  It feels sharp and is constant.  HPI     Home Medications Prior to Admission medications   Medication Sig Start Date End Date Taking? Authorizing Provider  Accu-Chek FastClix Lancets MISC USE TO TEST BLOOD SUGARS AT LEAST 5 TIMES A DAY (E11.65) 03/26/19   [provider]  albuterol (VENTOLIN HFA) 108 (90 Base) MCG/ACT inhaler Ventolin HFA 90 mcg/actuation aerosol inhaler  2 PUFFS AS NEEDED EVERY 6 HRS INHALATION 30 DAYS    [provider]  ALPRAZolam (XANAX) 0.25 MG tablet Take 0.125-0.25 mg by mouth 3 (three) times daily as needed for anxiety.     [provider]  amLODipine (NORVASC) 5 MG tablet Take 5 mg by mouth at bedtime.    [provider]  aspirin EC 81 MG tablet Take 81 mg by mouth daily.    [provider]  atorvastatin (LIPITOR) 40 MG tablet Take 40 mg by mouth at bedtime.    [provider]  bacitracin ophthalmic ointment bacitracin 500 unit/gram eye ointment  APPLY A SMALL AMOUNT TO BOTH EYES NIGHTLY, START 1 WEEK PRIOR TO SURGERY    [provider]  brimonidine  (ALPHAGAN) 0.2 % ophthalmic solution     [provider]  buPROPion (WELLBUTRIN XL) 150 MG 24 hr tablet Take 150 mg by mouth every morning. 05/10/19   [provider]  busPIRone (BUSPAR) 5 MG tablet Take 5 mg by mouth 2 (two) times daily. 09/13/17   [provider]  candesartan (ATACAND) 32 MG tablet Take 32 mg by mouth at bedtime.    [provider]  chlorpheniramine-HYDROcodone (TUSSIONEX) 10-8 MG/5ML SUER hydrocodone 10 mg-chlorpheniramine 8 mg/5 mL oral susp extend.rel 12hr  TAKE 5 ML AS NEEDED AT BEDTIME ORALLY 10 DAYS    [provider]  cloNIDine (CATAPRES - DOSED IN MG/24 HR) 0.2 mg/24hr patch APPLY 1 PATCH TOPICALLY TO  SKIN ONCE A WEEK 02/03/18   [provider]  cyclopentolate (CYCLODRYL,CYCLOGYL) 2 % ophthalmic solution     [provider]  doxazosin (CARDURA) 8 MG tablet TAKE 1/2 TABLET BY MOUTH ONCE A DAY 06/12/18   [provider]  FARXIGA 10 MG TABS tablet 1 tablet Orally Once a day 05/21/20   [provider]  fluconazole (DIFLUCAN) 150 MG tablet fluconazole 150 mg tablet  TAKE FIRST TABLET ON DAY 1 FOLLOWED BY SECOND TABLET IN 3-4- DAYS = FOR YEAST INFECTION    [provider]  fluticasone (FLONASE) 50 MCG/ACT nasal spray fluticasone propionate 50 mcg/actuation nasal spray,suspension  2 SPRAY IN EACH NOSTRIL ONCE A DAY    [provider]  gabapentin (NEURONTIN) 300 MG capsule Take 300 mg by mouth 3 (three) times daily. 03/15/19   [provider]  glucose blood test strip Accu-Chek Aviva Plus test strips  USE AS DIRECTED. TESTING FREQUENCY: 5X/DAILY.    [provider]  hydrochlorothiazide (HYDRODIURIL) 25 MG tablet TAKE 2 TABLETS BY MOUTH EVERY DAY 12/29/17   [provider]  insulin glargine (LANTUS) 100 UNIT/ML injection Inject into the skin. 06/19/18   [provider]  insulin lispro (HUMALOG) 100 UNIT/ML KwikPen INJECT SUBCUTANEOUSLY 15  UNITS 3 TIMES  DAILY BEFORE  [facility] MEAL 04/19/18   [provider]  Insulin Pen Needle (B-D ULTRAFINE III SHORT PEN) 31G X 8 MM MISC USE TO INJECT INSULIN 3  TIMES DAILY AS DIRECTED 04/19/18   [provider]  Insulin Syringe-Needle U-100 (BD SAFETYGLIDE INSULIN SYRINGE) 31G X 15/64" 1 ML MISC 1 Syringe by Misc.(Non-Drug; Combo Route) route 2 times daily. 03/15/19   [provider]  ketoconazole (NIZORAL) 2 % cream APPLY TO AFFECTED AREA EVERY DAY AS NEEDED FOR REDNESS AND ITCHING 06/16/17   [provider]  levothyroxine (SYNTHROID) 200 MCG tablet TAKE 1 TABLET (200 MCG TOTAL) BY MOUTH DAILY BEFORE BREAKFAST. (DAW) 02/14/18   [provider]  lidocaine (LIDODERM) 5 % Place 1 patch onto the skin daily. Remove & Discard patch within 12 hours or as directed by MD 02/08/22   Glynn Octave, MD  metoprolol succinate (TOPROL-XL) 50 MG 24 hr tablet Take 1 tablet by mouth daily. 05/02/19   [provider]  ondansetron (ZOFRAN) 4 MG tablet ondansetron HCl 4 mg tablet  TAKE 1 TABLET BY MOUTH AS NEEDED FOR NAUSEA    [provider]  pantoprazole (PROTONIX) 20 MG tablet pantoprazole 20 mg tablet,delayed release  TAKE 1 TABLET BY MOUTH 2 TIMES A DAY 90 DAYS    [provider]  tirzepatide Greggory Keen) 10 MG/0.5ML Pen Inject 10 mg into the skin once a week. 05/31/22   Corky Crafts, MD  traMADol (ULTRAM) 50 MG tablet Take 50 mg by mouth daily as needed.    [provider]  triamcinolone cream (KENALOG) 0.1 % PLEASE SEE ATTACHED FOR DETAILED DIRECTIONS 04/03/19   [provider]  valACYclovir (VALTREX) 500 MG tablet ONE TWICE A DAY FOR 7 DAYS PRN FOR HERPES OUTBREAK 04/22/17   [provider]  Vitamin D, Ergocalciferol, (DRISDOL) 50000 UNITS CAPS Take 50,000 Units by mouth every 7 (seven) days. Thursdays    [provider]      Allergies    Esomeprazole magnesium, Aliskiren, Augmentin [amoxicillin-pot clavulanate], Avelox  [moxifloxacin hcl in nacl], Azithromycin, Benzonatate, Codeine, Demerol [meperidine], Duloxetine, Erythromycin base, Exenatide, Keflex [cephalexin], Levofloxacin, Lisinopril, Metformin, Nifedipine, Nitrofurantoin macrocrystal, Paroxetine hcl, Rosiglitazone, Sitagliptin, Spironolactone, and Sulfamethoxazole    Review of Systems   See HPI for pertinent positives   Physical Exam   Vitals:   06/06/22 1623 06/06/22 1630  BP:  (!) 101/50  Pulse:  67  Resp:  14  Temp:    SpO2: 97% 100%    CONSTITUTIONAL:  well-appearing, NAD NEURO:  Alert and oriented x 3, CN 3-12 grossly intact EYES:  eyes equal and reactive ENT/NECK:  Supple, no stridor  CARDIO:  regular rate and rhythm, appears well-perfused  PULM:  No respiratory distress, CTAB GI/GU:  non-distended, soft MSK/SPINE:  No gross deformities, no edema, moves all extremities  SKIN:  no rash, atraumatic  *  Additional and/or pertinent findings included in MDM below   ED Results / Procedures / Treatments   Labs (all labs ordered are listed, but only abnormal results are displayed) Labs Reviewed  CBC WITH DIFFERENTIAL/PLATELET - Abnormal; Notable for the following components:      Result Value   RBC 3.84 (*)    Hemoglobin 11.2 (*)    HCT 34.9 (*)    All other components within normal limits  COMPREHENSIVE METABOLIC PANEL - Abnormal; Notable for the following components:   Chloride 97 (*)    CO2 34 (*)    Glucose, Bld 133 (*)    BUN 45 (*)    Creatinine, Ser 2.06 (*)    AST 11 (*)    GFR, Estimated 25 (*)    All other components within normal limits  LIPASE, BLOOD - Abnormal; Notable for the following components:   Lipase 72 (*)    All other components within normal limits  SARS CORONAVIRUS 2 BY RT PCR  URINE CULTURE  CULTURE, BLOOD (ROUTINE X 2)  CULTURE, BLOOD (ROUTINE X 2)  LACTIC ACID, PLASMA  URINALYSIS, ROUTINE W REFLEX MICROSCOPIC    EKG EKG Interpretation  Date/Time:  Sunday Jun 06 2022 16:12:20  EDT Ventricular Rate:  70 PR Interval:  187 QRS Duration: 132 QT Interval:  435 QTC Calculation: 470 R Axis:   -68 Text Interpretation: Sinus rhythm Atrial premature complexes Nonspecific IVCD with LAD Left ventricular hypertrophy ST elevation, consider inferior injury No significant change since last tracing Confirmed by Elayne Snare ([id number]) on 06/06/2022 6:28:12 PM  Radiology CT CHEST ABDOMEN PELVIS WO CONTRAST  Result Date: 06/06/2022 CLINICAL DATA:  Sepsis. EXAM: CT CHEST, ABDOMEN AND PELVIS WITHOUT CONTRAST TECHNIQUE: Multidetector CT imaging of the chest, abdomen and pelvis was performed following the standard protocol without IV contrast. RADIATION DOSE REDUCTION: This exam was performed according to the departmental dose-optimization program which includes automated exposure control, adjustment of the mA and/or kV according to patient size and/or use of iterative reconstruction technique. COMPARISON:  January 20, 2016 FINDINGS: CT CHEST FINDINGS Cardiovascular: Mildly enlarged cardiac silhouette. Calcific atherosclerotic disease of the coronary arteries and aorta. Mediastinum/Nodes: No enlarged mediastinal, hilar, or axillary lymph nodes. Thyroid gland, trachea, and esophagus demonstrate no significant findings. Lungs/Pleura: Focal airspace consolidation versus ground-glass pulmonary mass measures 4.5 x 2.9 cm in the central right upper lobe. Mild ground-glass opacities in the left upper lobe centrally. Musculoskeletal: No chest wall mass or suspicious bone lesions identified. CT ABDOMEN PELVIS FINDINGS Hepatobiliary: No focal liver abnormality is seen. Status post cholecystectomy. No biliary dilatation. Pancreas: Unremarkable. No pancreatic ductal dilatation or surrounding inflammatory changes. Spleen: Normal in size without focal abnormality. Adrenals/Urinary Tract: Partially calcified right renal mass post cryoablation measures 2.6 x 2.3 cm. Left renal cyst. No hydronephrosis. Normal urinary  bladder. Stomach/Bowel: Stomach is within normal limits. No evidence of appendicitis. No evidence of bowel wall thickening, distention, or inflammatory changes. Vascular/Lymphatic: Aortic atherosclerosis. No enlarged abdominal or pelvic lymph nodes. Reproductive: Status post hysterectomy. No adnexal masses. Other: No abdominal wall hernia or abnormality. No abdominopelvic ascites. Musculoskeletal: Spondylosis of the lumbosacral spine. IMPRESSION: 1. Focal airspace consolidation versus ground-glass pulmonary mass measures 4.5 x 2.9 cm in the central right upper lobe. Mild ground-glass opacities in the left upper lobe centrally. These findings may represent multifocal pneumonia. Follow-up to resolution is recommended to exclude underlying neoplasm. 2. Partially calcified right renal mass post cryoablation measures 2.6 x 2.3 cm. 3. Aortic atherosclerosis. Aortic Atherosclerosis (ICD10-I70.0). Electronically Signed  By: Ted Mcalpine M.D.   On: 06/06/2022 17:44    Procedures .Critical Care  Performed by: Gareth Eagle, PA-C Authorized by: Gareth Eagle, PA-C   Critical care provider statement:    Critical care time (minutes):  30   Critical care was necessary to treat or prevent imminent or life-threatening deterioration of the following conditions:  Renal failure and sepsis   Critical care was time spent personally by me on the following activities:  Development of treatment plan with patient or surrogate, discussions with consultants, evaluation of patient's response to treatment, examination of patient, ordering and review of laboratory studies, ordering and review of radiographic studies, ordering and performing treatments and interventions, pulse oximetry, re-evaluation of patient's condition and review of old charts     Medications Ordered in ED Medications  doxycycline (VIBRAMYCIN) 100 mg in sodium chloride 0.9 % 250 mL IVPB (has no administration in time range)  ceFEPIme (MAXIPIME)  2 g in sodium chloride 0.9 % 100 mL IVPB (has no administration in time range)  sodium chloride 0.9 % bolus 1,000 mL (0 mLs Intravenous Stopped 06/06/22 1752)  acetaminophen (TYLENOL) tablet 1,000 mg (1,000 mg Oral Given 06/06/22 1508)  metroNIDAZOLE (FLAGYL) IVPB 500 mg (0 mg Intravenous Stopped 06/06/22 1744)  ceFEPIme (MAXIPIME) 2 g in sodium chloride 0.9 % 100 mL IVPB (0 g Intravenous Stopped 06/06/22 1711)  sodium chloride 0.9 % bolus 1,000 mL (1,000 mLs Intravenous New Bag/Given 06/06/22 1752)    ED Course/ Medical Decision Making/ A&P Clinical Course as of 06/06/22 1858  Sun Jun 06, 2022  1513 SARS Coronavirus 2 by RT PCR (hospital order, performed in [facility] hospital lab) *cepheid single result test* Anterior Nasal Swab [JR]    Clinical Course User Index [JR] Gareth Eagle, PA-C                             Medical Decision Making Amount and/or Complexity of Data Reviewed Labs: ordered. Decision-making details documented in ED Course. Radiology: ordered.  Risk OTC drugs. Prescription drug management. Decision regarding hospitalization.   Initial Impression and Ddx 77 year old well-appearing female presenting for cough, diarrhea and abdominal pain.  Initially febrile and hypotensive prompting concern for evolving sepsis.  Exam notable for left-sided abdominal tenderness.  Patient PMH that increases complexity of ED encounter:  CKD, diabetes, hyperlipidemia, hypertension and GERD  Interpretation of Diagnostics -I independent reviewed and interpreted the labs as followed: Reduce GFR, elevated BUN  - I independently visualized the following imaging with scope of interpretation limited to determining acute life threatening conditions related to emergency care: CT chest abdomen pelvis, which revealed revealed concern for multifocal pneumonia with possibility of underlying neoplasm  -I personally reviewed and interpreted EKG which revealed sinus rhythm  Patient Reassessment  and Ultimate Disposition/Management Initial concern was sepsis, given cough abdominal pain was concerned source could be pneumonia versus intra-abdominal infection.  CT scan did reveal multifocal pneumonia but no evidence of intra-abdominal pathology.  Started on antibiotics.  After volume resuscitation, blood pressure did improve.  Defervesced after Tylenol.  Admitted to hospital service with Dr. Allena Katz for sepsis likely secondary to pneumonia and AKI.  Patient management required discussion with the following services or consulting groups:  Hospitalist Service  Complexity of Problems Addressed Acute complicated illness or Injury  Additional Data Reviewed and Analyzed Further history obtained from: Further history from spouse/family member, Past medical history and medications listed in the EMR, and Prior  ED visit notes  Patient Encounter Risk Assessment Consideration of hospitalization         Final Clinical Impression(s) / ED Diagnoses Final diagnoses:  Pneumonia due to infectious organism, unspecified laterality, unspecified part of lung  Sepsis, due to unspecified organism, unspecified whether acute organ dysfunction present [facility]  AKI (acute kidney injury) [facility])    Rx / DC Orders ED Discharge Orders     None         Gareth Eagle, PA-C 06/06/22 1859    Rexford Maus, DO 06/06/22 2301

## 2022-06-06 NOTE — Assessment & Plan Note (Signed)
Continue Synthroid °

## 2022-06-06 NOTE — Assessment & Plan Note (Signed)
Continue CPAP nightly.  Patient admits she does not use consistently at home.

## 2022-06-07 DIAGNOSIS — J181 Lobar pneumonia, unspecified organism: Secondary | ICD-10-CM | POA: Diagnosis not present

## 2022-06-07 LAB — GLUCOSE, CAPILLARY
Glucose-Capillary: 135 mg/dL — ABNORMAL HIGH (ref 70–99)
Glucose-Capillary: 103 mg/dL — ABNORMAL HIGH (ref 70–99)
Glucose-Capillary: 122 mg/dL — ABNORMAL HIGH (ref 70–99)
Glucose-Capillary: 132 mg/dL — ABNORMAL HIGH (ref 70–99)

## 2022-06-07 LAB — URINE CULTURE: Culture: NO GROWTH

## 2022-06-07 LAB — STREP PNEUMONIAE URINARY ANTIGEN: Strep Pneumo Urinary Antigen: NEGATIVE

## 2022-06-07 MED ORDER — SODIUM CHLORIDE 0.9 % IV SOLN: INTRAVENOUS | Status: AC

## 2022-06-07 MED ORDER — HYDROCOD POLI-CHLORPHE POLI ER 10-8 MG/5ML PO SUER: 5.0000 mL | Freq: Every evening | ORAL | Status: DC | PRN

## 2022-06-07 MED ORDER — GUAIFENESIN-DM 100-10 MG/5ML PO SYRP
5.0000 mL | ORAL_SOLUTION | ORAL | Status: DC | PRN
  Filled 2022-06-07: qty 10

## 2022-06-07 MED ORDER — HYDROCOD POLI-CHLORPHE POLI ER 10-8 MG/5ML PO SUER
5.0000 mL | Freq: Every evening | ORAL | Status: AC | PRN
  Administered 2022-06-07: 5 mL via ORAL
  Filled 2022-06-07: qty 5

## 2022-06-07 NOTE — Progress Notes (Signed)
Tried PT on cpap machine, unable to tolerate, PT states she will attempt to get somebody to bring her home mask.

## 2022-06-07 NOTE — Progress Notes (Signed)
Mobility Specialist - Progress Note   06/07/22 1403  Mobility  Activity Ambulated independently in hallway  Level of Assistance Independent  Assistive Device None  Distance Ambulated (ft) 100 ft  Activity Response Tolerated well  Mobility Referral Yes  $Mobility charge 1 Mobility  Mobility Specialist Start Time (ACUTE ONLY) 0147  Mobility Specialist Stop Time (ACUTE ONLY) 0201  Mobility Specialist Time Calculation (min) (ACUTE ONLY) 14 min   Pt received in recliner and agreeable to mobility. No complaints during session. Pt to recliner after session with all needs met.    [facility]

## 2022-06-07 NOTE — Plan of Care (Signed)
  Problem: Education: Goal: Knowledge of General Education information will improve Description: Including pain rating scale, medication(s)/side effects and non-pharmacologic comfort measures Outcome: Progressing   Problem: Health Behavior/Discharge Planning: Goal: Ability to manage health-related needs will improve Outcome: Progressing   Problem: Clinical Measurements: Goal: Diagnostic test results will improve Outcome: Progressing Goal: Respiratory complications will improve Outcome: Progressing   Problem: Activity: Goal: Risk for activity intolerance will decrease Outcome: Progressing   Problem: Safety: Goal: Ability to remain free from injury will improve Outcome: Progressing

## 2022-06-07 NOTE — Progress Notes (Signed)
PROGRESS NOTE  [name]:[id number] DOB: 01-28-1945 DOA: 06/06/2022 PCP: Georgann Housekeeper, MD   LOS: 1 day   Brief Narrative / Interim history: 77 year old female with IDDM, CKD 3A, HTN, HLD, hypothyroidism, asthma, right renal cell cancer status post cryoablation, anxiety, OSA on CPAP comes into the hospital with cough and congestion for the past 3 days.  In the ED she was found to be febrile to 100.5, soft blood pressure 99/45 and imaging of the chest showed focal airspace consolidation in the central right upper lobe.  She was placed on antibiotics and admitted to the hospital.  Subjective / 24h Interval events: She is doing well this morning, complains of weakness.  Still has a cough.  Assesement and Plan: Principal Problem:   Right upper lobe consolidation (HCC) Active Problems:   Acute kidney injury superimposed on chronic kidney disease stage IIIa (HCC)   Hypertension associated with diabetes (HCC)   Controlled type 2 diabetes mellitus with stable proliferative retinopathy of both eyes, with long-term current use of insulin (HCC)   Hypothyroidism   Hyperlipidemia associated with type 2 diabetes mellitus (HCC)   Depression with anxiety   OSA (obstructive sleep apnea)  Principal problem Right upper lobe pneumonia -there is a concern for underlying pulmonary mass, however clinically, with her fever, recent cough it may very well be just pneumonia.  Continue antibiotics for now and supportive treatment.  Will need repeat chest imaging once treatment is complete to rule out an underlying mass. -Mild hypoxemia was noted with sats of 90% on room air, and she was placed on 1 L.  Attempt to wean off to room air as tolerated.  Active problems Acute kidney injury on chronic kidney disease stage IIIa -this is likely due to poor p.o. intake in the setting of her illness.  Baseline creatinine is around 1.2-1.5.  Continue IV fluids, creatinine on admission was 2.0, improved to 1.8 this  morning but still not at baseline.  Essential hypertension -hypotensive in the ED, continue metoprolol as her blood pressure is now better  Hypothyroidism-continue Synthroid  OSA-continue CPAP  Anxiety-continue home Xanax  Type 2 diabetes mellitus, poorly controlled-continue sliding scale  Lab Results  Component Value Date   HGBA1C 7.9 (H) 09/23/2015   CBG (last 3)  Recent Labs    06/06/22 2051 06/07/22 0744  GLUCAP 133* 135*   Scheduled Meds:  atorvastatin  40 mg Oral QHS   doxycycline  100 mg Oral Q12H   enoxaparin (LOVENOX) injection  40 mg Subcutaneous Q24H   insulin aspart  0-15 Units Subcutaneous TID WC   insulin aspart  0-5 Units Subcutaneous QHS   levothyroxine  200 mcg Oral Q0600   metoprolol succinate  50 mg Oral Daily   pantoprazole  20 mg Oral BID   sodium chloride flush  3 mL Intravenous Q12H   Continuous Infusions:  sodium chloride 75 mL/hr at 06/07/22 1000   cefTRIAXone (ROCEPHIN)  IV 2 g (06/07/22 0603)   PRN Meds:.acetaminophen **OR** acetaminophen, albuterol, ALPRAZolam, guaiFENesin, ondansetron **OR** ondansetron (ZOFRAN) IV, senna-docusate, traMADol  Current Outpatient Medications  Medication Instructions   Accu-Chek FastClix Lancets MISC USE TO TEST BLOOD SUGARS AT LEAST 5 TIMES A DAY (E11.65)   albuterol (VENTOLIN HFA) 108 (90 Base) MCG/ACT inhaler 2 puffs, Inhalation, Every 6 hours PRN   ALPRAZolam (XANAX) 0.125-0.25 mg, Oral, 3 times daily PRN   amLODipine (NORVASC) 5 mg, Daily at bedtime   amoxicillin (AMOXIL) 500 mg, Oral, 3 times daily   aspirin EC  81 mg, Daily   atorvastatin (LIPITOR) 40 mg, Oral, Daily at bedtime   bacitracin ophthalmic ointment 1 Application, Both Eyes, Nightly   brimonidine (ALPHAGAN) 0.2 % ophthalmic solution    buPROPion (WELLBUTRIN XL) 150 mg, Oral, Every morning   busPIRone (BUSPAR) 5 mg, Oral, 2 times daily   candesartan (ATACAND) 32 mg, Daily at bedtime   chlorpheniramine-HYDROcodone (TUSSIONEX) 10-8 MG/5ML  SUER 5 mLs, Oral, At bedtime PRN   cloNIDine (CATAPRES - DOSED IN MG/24 HR) 0.2 mg, Transdermal, Weekly   cyclopentolate (CYCLODRYL,CYCLOGYL) 2 % ophthalmic solution    doxazosin (CARDURA) 4 mg, Oral, Daily   Farxiga 10 mg, Oral, Daily   fluconazole (DIFLUCAN) 150 mg, Oral,  Once, Followed by a second tablet in 3-4 days for yeast infection.   fluticasone (FLONASE) 50 MCG/ACT nasal spray 2 sprays, Each Nare, Daily   gabapentin (NEURONTIN) 300 mg, Oral, 3 times daily   glucose blood test strip Accu-Chek Aviva Plus test strips  USE AS DIRECTED. TESTING FREQUENCY: 5X/DAILY.   HYCODAN 5-1.5 MG/5ML syrup 5 mLs, Oral, At bedtime PRN   hydrochlorothiazide (HYDRODIURIL) 50 mg, Oral, Daily   insulin glargine (LANTUS) 100 UNIT/ML injection Subcutaneous   insulin lispro (HUMALOG) 15 Units, Subcutaneous, 3 times daily, Before meals   Insulin Pen Needle (B-D ULTRAFINE III SHORT PEN) 31G X 8 MM MISC USE TO INJECT INSULIN 3  TIMES DAILY AS DIRECTED   Insulin Syringe-Needle U-100 (BD SAFETYGLIDE INSULIN SYRINGE) 31G X 15/64" 1 ML MISC 1 Syringe by Misc.(Non-Drug; Combo Route) route 2 times daily.   ketoconazole (NIZORAL) 2 % cream 1 Application, Topical, Daily PRN   levothyroxine (SYNTHROID) 200 mcg, Oral, Daily before breakfast   lidocaine (LIDODERM) 5 % 1 patch, Transdermal, Every 24 hours, Remove & Discard patch within 12 hours or as directed by MD   methocarbamol (ROBAXIN) 500 mg, Oral, At bedtime PRN   metoprolol succinate (TOPROL-XL) 50 MG 24 hr tablet 1 tablet, Oral, Daily   MIEBO 1.338 GM/ML SOLN 1 drop, Both Eyes, 4 times daily   Mounjaro 10 mg, Subcutaneous, Weekly   ondansetron (ZOFRAN) 4 mg, Oral, Every 8 hours PRN   pantoprazole (PROTONIX) 20 mg, Oral, 2 times daily   pantoprazole (PROTONIX) 40 mg, Oral, Daily   traMADol (ULTRAM) 50 mg, Oral, Daily PRN   triamcinolone cream (KENALOG) 0.1 % PLEASE SEE ATTACHED FOR DETAILED DIRECTIONS   valACYclovir (VALTREX) 500 mg, Oral, 2 times daily PRN    Vitamin D (Ergocalciferol) (DRISDOL) 50,000 Units, Oral, Every 7 days, Thursdays    Diet Orders (From admission, onward)     Start     Ordered   06/06/22 2114  Diet heart healthy/carb modified Room service appropriate? Yes; Fluid consistency: Thin  Diet effective now       Question Answer Comment  Diet-HS Snack? Nothing   Room service appropriate? Yes   Fluid consistency: Thin      06/06/22 2113            DVT prophylaxis: enoxaparin (LOVENOX) injection 40 mg Start: 06/07/22 1000   Lab Results  Component Value Date   PLT 143 (L) 06/06/2022      Code Status: Full Code  Family Communication: No family at bedside  Status is: Inpatient Remains inpatient appropriate because: IV antibiotics  Level of care: Progressive  Consultants:  none  Objective: Vitals:   06/06/22 2014 06/06/22 2048 06/07/22 0136 06/07/22 0600  BP:  114/62 (!) 139/59 (!) 137/55  Pulse:  73 80 71  Resp:  (!)  21 15 14   Temp: 98.9 F (37.2 C) 98.6 F (37 C) 99.1 F (37.3 C) 98.3 F (36.8 C)  TempSrc: Oral Oral Oral Oral  SpO2:  96% 100% 100%  Weight:  (!) 139.3 kg    Height:  5\' 8"  (1.727 m)      Intake/Output Summary (Last 24 hours) at 06/07/2022 1037 Last data filed at 06/07/2022 0600 Gross per 24 hour  Intake 2439.59 ml  Output 350 ml  Net 2089.59 ml   Wt Readings from Last 3 Encounters:  06/06/22 (!) 139.3 kg  02/26/22 (!) 139.2 kg  02/08/22 (!) 140.6 kg    Examination:  Constitutional: NAD Eyes: no scleral icterus ENMT: Mucous membranes are moist.  Neck: normal, supple Respiratory: clear to auscultation bilaterally, no wheezing, no crackles. Normal respiratory effort. No accessory muscle use.  Cardiovascular: Regular rate and rhythm, no murmurs / rubs / gallops. No LE edema.  Abdomen: non distended, no tenderness. Bowel sounds positive.  Musculoskeletal: no clubbing / cyanosis.   Data Reviewed: I have independently reviewed following labs and imaging studies    CBC Recent Labs  Lab 06/06/22 1450 06/06/22 2329  WBC 8.8 7.4  HGB 11.2* 11.2*  HCT 34.9* 35.5*  PLT 150 143*  MCV 90.9 93.2  MCH 29.2 29.4  MCHC 32.1 31.5  RDW 15.1 14.9  LYMPHSABS 2.3  --   MONOABS 0.9  --   EOSABS 0.1  --   BASOSABS 0.0  --     Recent Labs  Lab 06/06/22 1450 06/06/22 1451 06/06/22 2318 06/06/22 2329  NA 138  --   --  138  K 4.0  --   --  3.5  CL 97*  --   --  104  CO2 34*  --   --  26  GLUCOSE 133*  --   --  137*  BUN 45*  --   --  42*  CREATININE 2.06*  --  1.85* 1.86*  CALCIUM 9.1  --   --  8.4*  AST 11*  --   --   --   ALT 8  --   --   --   ALKPHOS 60  --   --   --   BILITOT 0.9  --   --   --   ALBUMIN 3.6  --   --   --   LATICACIDVEN  --  0.7  --   --     ------------------------------------------------------------------------------------------------------------------ No results for input(s): "CHOL", "HDL", "LDLCALC", "TRIG", "CHOLHDL", "LDLDIRECT" in the last 72 hours.  Lab Results  Component Value Date   HGBA1C 7.9 (H) 09/23/2015   ------------------------------------------------------------------------------------------------------------------ No results for input(s): "TSH", "T4TOTAL", "T3FREE", "THYROIDAB" in the last 72 hours.  Invalid input(s): "FREET3"  Cardiac Enzymes No results for input(s): "CKMB", "TROPONINI", "MYOGLOBIN" in the last 168 hours.  Invalid input(s): "CK" ------------------------------------------------------------------------------------------------------------------ No results found for: "BNP"  CBG: Recent Labs  Lab 06/06/22 2051 06/07/22 0744  GLUCAP 133* 135*    Recent Results (from the past 240 hour(s))  SARS Coronavirus 2 by RT PCR (hospital order, performed in [facility] hospital lab) *cepheid single result test* Anterior Nasal Swab     Status: None   Collection Time: 06/06/22  2:37 PM   Specimen: Anterior Nasal Swab  Result Value Ref Range Status   SARS Coronavirus 2 by RT PCR  NEGATIVE NEGATIVE Final    Comment: (NOTE) SARS-CoV-2 target nucleic acids are NOT DETECTED.  The SARS-CoV-2 RNA is generally detectable in upper  and lower respiratory specimens during the acute phase of infection. The lowest concentration of SARS-CoV-2 viral copies this assay can detect is 250 copies / mL. A negative result does not preclude SARS-CoV-2 infection and should not be used as the sole basis for treatment or other patient management decisions.  A negative result may occur with improper specimen collection / handling, submission of specimen other than nasopharyngeal swab, presence of viral mutation(s) within the areas targeted by this assay, and inadequate number of viral copies (<250 copies / mL). A negative result must be combined with clinical observations, patient history, and epidemiological information.  Fact Sheet for Patients:   RoadLapTop.co.za  Fact Sheet for Healthcare Providers: http://kim-miller.com/  This test is not yet approved or  cleared by the [place] FDA and has been authorized for detection and/or diagnosis of SARS-CoV-2 by FDA under an Emergency Use Authorization (EUA).  This EUA will remain in effect (meaning this test can be used) for the duration of the COVID-19 declaration under Section 564(b)(1) of the Act, 21 U.S.C. section 360bbb-3(b)(1), unless the authorization is terminated or revoked sooner.  Performed at [facility], [address] [place], [place] [address]   Culture, blood (routine x 2)     Status: None (Preliminary result)   Collection Time: 06/06/22  2:51 PM   Specimen: BLOOD  Result Value Ref Range Status   Specimen Description BLOOD LEFT ANTECUBITAL  Final   Special Requests   Final    BOTTLES DRAWN AEROBIC AND ANAEROBIC Blood Culture adequate volume   Culture   Final    NO GROWTH < 12 HOURS Performed at [facility], [address]  [place], [place] [address]    Report Status PENDING  Incomplete  MRSA Next Gen by PCR, Nasal     Status: None   Collection Time: 06/06/22 10:27 PM   Specimen: Nasal Mucosa; Nasal Swab  Result Value Ref Range Status   MRSA by PCR Next Gen NOT DETECTED NOT DETECTED Final    Comment: (NOTE) The GeneXpert MRSA Assay (FDA approved for NASAL specimens only), is one component of a comprehensive MRSA colonization surveillance program. It is not intended to diagnose MRSA infection nor to guide or monitor treatment for MRSA infections. Test performance is not FDA approved in patients less than 7 years old. Performed at [facility], [address] [place], [place] [address]      Radiology Studies: CT CHEST ABDOMEN PELVIS WO CONTRAST  Result Date: 06/06/2022 CLINICAL DATA:  Sepsis. EXAM: CT CHEST, ABDOMEN AND PELVIS WITHOUT CONTRAST TECHNIQUE: Multidetector CT imaging of the chest, abdomen and pelvis was performed following the standard protocol without IV contrast. RADIATION DOSE REDUCTION: This exam was performed according to the departmental dose-optimization program which includes automated exposure control, adjustment of the mA and/or kV according to patient size and/or use of iterative reconstruction technique. COMPARISON:  January 20, 2016 FINDINGS: CT CHEST FINDINGS Cardiovascular: Mildly enlarged cardiac silhouette. Calcific atherosclerotic disease of the coronary arteries and aorta. Mediastinum/Nodes: No enlarged mediastinal, hilar, or axillary lymph nodes. Thyroid gland, trachea, and esophagus demonstrate no significant findings. Lungs/Pleura: Focal airspace consolidation versus ground-glass pulmonary mass measures 4.5 x 2.9 cm in the central right upper lobe. Mild ground-glass opacities in the left upper lobe centrally. Musculoskeletal: No chest wall mass or suspicious bone lesions identified. CT ABDOMEN PELVIS FINDINGS Hepatobiliary: No focal liver abnormality is seen. Status  post cholecystectomy. No biliary dilatation. Pancreas: Unremarkable. No pancreatic ductal dilatation or surrounding  inflammatory changes. Spleen: Normal in size without focal abnormality. Adrenals/Urinary Tract: Partially calcified right renal mass post cryoablation measures 2.6 x 2.3 cm. Left renal cyst. No hydronephrosis. Normal urinary bladder. Stomach/Bowel: Stomach is within normal limits. No evidence of appendicitis. No evidence of bowel wall thickening, distention, or inflammatory changes. Vascular/Lymphatic: Aortic atherosclerosis. No enlarged abdominal or pelvic lymph nodes. Reproductive: Status post hysterectomy. No adnexal masses. Other: No abdominal wall hernia or abnormality. No abdominopelvic ascites. Musculoskeletal: Spondylosis of the lumbosacral spine. IMPRESSION: 1. Focal airspace consolidation versus ground-glass pulmonary mass measures 4.5 x 2.9 cm in the central right upper lobe. Mild ground-glass opacities in the left upper lobe centrally. These findings may represent multifocal pneumonia. Follow-up to resolution is recommended to exclude underlying neoplasm. 2. Partially calcified right renal mass post cryoablation measures 2.6 x 2.3 cm. 3. Aortic atherosclerosis. Aortic Atherosclerosis (ICD10-I70.0). Electronically Signed   By: Ted Mcalpine M.D.   On: 06/06/2022 17:44     Pamella Pert, MD, PhD [facility]  Between 7 am - 7 pm I am available, please contact me via Amion (for emergencies) or Securechat (non urgent messages)  Between 7 pm - 7 am I am not available, please contact night coverage MD/APP via Amion

## 2022-06-07 NOTE — Progress Notes (Signed)
Family member did not bring her home mask so patient does not want to wear cpap tonight

## 2022-06-07 NOTE — Progress Notes (Signed)
Mobility Specialist - Progress Note   06/07/22 1101  Oxygen Therapy  SpO2 91 %  O2 Device Nasal Cannula  O2 Flow Rate (L/min) 2 L/min  Patient Activity (if Appropriate) Ambulating  Mobility  Activity Ambulated independently in hallway  Level of Assistance Independent  Assistive Device None  Distance Ambulated (ft) 80 ft  Activity Response Tolerated well  Mobility Referral Yes  $Mobility charge 1 Mobility  Mobility Specialist Start Time (ACUTE ONLY) 1049  Mobility Specialist Stop Time (ACUTE ONLY) 1100  Mobility Specialist Time Calculation (min) (ACUTE ONLY) 11 min   Pt received in bed and agreeable to mobility. Pt was MinA from sit>stand. No complaints during session. Pt to bed after session with all needs met. Bed alarm on.    During mobility: 91% SpO2 (2L [place]) Post-mobility: 91% SPO2 (2L [place])  Chief Technology Officer

## 2022-06-08 DIAGNOSIS — J181 Lobar pneumonia, unspecified organism: Secondary | ICD-10-CM | POA: Diagnosis not present

## 2022-06-08 LAB — COMPREHENSIVE METABOLIC PANEL
ALT: 12 U/L (ref 0–44)
AST: 14 U/L — ABNORMAL LOW (ref 15–41)
Albumin: 3 g/dL — ABNORMAL LOW (ref 3.5–5.0)
Alkaline Phosphatase: 61 U/L (ref 38–126)
Anion gap: 5 (ref 5–15)
BUN: 28 mg/dL — ABNORMAL HIGH (ref 8–23)
CO2: 29 mmol/L (ref 22–32)
Calcium: 8.6 mg/dL — ABNORMAL LOW (ref 8.9–10.3)
Chloride: 104 mmol/L (ref 98–111)
Creatinine, Ser: 1.41 mg/dL — ABNORMAL HIGH (ref 0.44–1.00)
GFR, Estimated: 39 mL/min — ABNORMAL LOW (ref >60)
Glucose, Bld: 133 mg/dL — ABNORMAL HIGH (ref 70–99)
Potassium: 3.6 mmol/L (ref 3.5–5.1)
Sodium: 138 mmol/L (ref 135–145)
Total Bilirubin: 0.9 mg/dL (ref 0.3–1.2)
Total Protein: 7.2 g/dL (ref 6.5–8.1)

## 2022-06-08 LAB — CBC
HCT: 37.8 % (ref 36.0–46.0)
Hemoglobin: 11.7 g/dL — ABNORMAL LOW (ref 12.0–15.0)
MCH: 29.3 pg (ref 26.0–34.0)
MCHC: 31 g/dL (ref 30.0–36.0)
MCV: 94.5 fL (ref 80.0–100.0)
Platelets: 162 10*3/uL (ref 150–400)
RBC: 4 MIL/uL (ref 3.87–5.11)
RDW: 14.8 % (ref 11.5–15.5)
WBC: 6.3 10*3/uL (ref 4.0–10.5)
nRBC: 0 % (ref 0.0–0.2)

## 2022-06-08 LAB — GLUCOSE, CAPILLARY
Glucose-Capillary: 131 mg/dL — ABNORMAL HIGH (ref 70–99)
Glucose-Capillary: 110 mg/dL — ABNORMAL HIGH (ref 70–99)
Glucose-Capillary: 118 mg/dL — ABNORMAL HIGH (ref 70–99)
Glucose-Capillary: 154 mg/dL — ABNORMAL HIGH (ref 70–99)

## 2022-06-08 LAB — MAGNESIUM: Magnesium: 1.8 mg/dL (ref 1.7–2.4)

## 2022-06-08 MED ORDER — HYDROCOD POLI-CHLORPHE POLI ER 10-8 MG/5ML PO SUER
5.0000 mL | Freq: Every evening | ORAL | Status: AC | PRN
  Administered 2022-06-08: 5 mL via ORAL
  Filled 2022-06-08: qty 5

## 2022-06-08 MED ORDER — ALUM & MAG HYDROXIDE-SIMETH 200-200-20 MG/5ML PO SUSP
30.0000 mL | Freq: Four times a day (QID) | ORAL | Status: DC | PRN
  Administered 2022-06-08 – 2022-06-09 (×2): 30 mL via ORAL
  Filled 2022-06-08 (×2): qty 30

## 2022-06-08 NOTE — Progress Notes (Signed)
Patient declines nocturnal CPAP tonight stating intolerance to the mask. Family has agreed to supply a home apparatus and will bring it into the hospital tomorrow. Equipment remains at bedside.

## 2022-06-08 NOTE — Plan of Care (Signed)

## 2022-06-08 NOTE — Evaluation (Signed)
Physical Therapy Evaluation Patient Details Name: [name] MRN: [id number] DOB: 1945/07/20 Today's Date: 06/08/2022  History of Present Illness  77 year old female with comes into the hospital with cough and congestion,  imaging of the chest showed focal airspace consolidation in the central right upper lobe; admitted with RUL pna  PMH: IDDM, CKD 3A, HTN, HLD, hypothyroidism, asthma, right renal cell cancer status post cryoablation, anxiety, OSA on CPAP  Clinical Impression  Pt admitted with above diagnosis.  Pt agreeable to PT;   pt fatigues easily with short bout of activity, requiring seated rest after 1st amb trial of  60';  sats decr to 80% on RA after second 67' distance, pt denies dyspnea; sats incr to >90% with rest and 1.5L O2 Will continue to follow for education on proper breathing techniques, postural exercises and to improve overall activity tolerance.  Pt currently with functional limitations due to the deficits listed below (see PT Problem List). Pt will benefit from acute skilled PT to increase their independence and safety with mobility to allow discharge.          Recommendations for follow up therapy are one component of a multi-disciplinary discharge planning process, led by the attending physician.  Recommendations may be updated based on patient status, additional functional criteria and insurance authorization.  Follow Up Recommendations       Assistance Recommended at Discharge PRN  Patient can return home with the following  Assistance with cooking/housework;Assist for transportation;Help with stairs or ramp for entrance    Equipment Recommendations None recommended by PT  Recommendations for Other Services       Functional Status Assessment Patient has had a recent decline in their functional status and demonstrates the ability to make significant improvements in function in a reasonable and predictable amount of time.     Precautions / Restrictions  Precautions Precautions: Fall Restrictions Weight Bearing Restrictions: No      Mobility  Bed Mobility               General bed mobility comments: in recliner on arrival    Transfers Overall transfer level: Needs assistance   Transfers: Sit to/from Stand Sit to Stand: Supervision           General transfer comment: for safety, no physical assist    Ambulation/Gait Ambulation/Gait assistance: Min guard, Supervision Gait Distance (Feet): 60 Feet (x2)   Gait Pattern/deviations: Decreased stride length, Wide base of support       General Gait Details: pt fatigues easily needing seated rest after 1st amb trial of  60'; sats decr to 80% on RA after second 52' distance, pt denies dyspnea; sats incr to >90% with rest and 1.5L O2  Stairs            Wheelchair Mobility    Modified Rankin (Stroke Patients Only)       Balance Overall balance assessment: Mild deficits observed, not formally tested                                           Pertinent Vitals/Pain Pain Assessment Pain Assessment: No/denies pain    Home Living Family/patient expects to be discharged to:: Private residence Living Arrangements: Spouse/significant other Available Help at Discharge: Family Type of Home: House Home Access: Ramped entrance       Home Layout: One level Home Equipment: None  Prior Function Prior Level of Function : Independent/Modified Independent                     Hand Dominance        Extremity/Trunk Assessment   Upper Extremity Assessment Upper Extremity Assessment: Overall WFL for tasks assessed    Lower Extremity Assessment Lower Extremity Assessment: Overall WFL for tasks assessed       Communication   Communication: No difficulties  Cognition Arousal/Alertness: Awake/alert Behavior During Therapy: WFL for tasks assessed/performed Overall Cognitive Status: Within Functional Limits for tasks assessed                                           General Comments      Exercises     Assessment/Plan    PT Assessment Patient needs continued PT services  PT Problem List Cardiopulmonary status limiting activity;Decreased activity tolerance;Decreased mobility       PT Treatment Interventions Therapeutic exercise;Gait training;Therapeutic activities;Patient/family education;Functional mobility training    PT Goals (Current goals can be found in the Care Plan section)  Acute Rehab PT Goals Patient Stated Goal: to be off O2 PT Goal Formulation: With patient Time For Goal Achievement: 06/22/22 Potential to Achieve Goals: Good Additional Goals Additional Goal #1: Pt to verbalize and return demo proper breathing techniques    Frequency Min 1X/week     Co-evaluation               AM-PAC PT "6 Clicks" Mobility  Outcome Measure Help needed turning from your back to your side while in a flat bed without using bedrails?: A Little Help needed moving from lying on your back to sitting on the side of a flat bed without using bedrails?: A Little Help needed moving to and from a bed to a chair (including a wheelchair)?: None Help needed standing up from a chair using your arms (e.g., wheelchair or bedside chair)?: None Help needed to walk in hospital room?: A Little Help needed climbing 3-5 steps with a railing? : A Little 6 Click Score: 20    End of Session   Activity Tolerance: Patient limited by fatigue Patient left: in chair;with call bell/phone within reach;with family/visitor present (in recliner on arrival)   PT Visit Diagnosis: Other abnormalities of gait and mobility (R26.89);Difficulty in walking, not elsewhere classified (R26.2)    Time: 8119-1478 PT Time Calculation (min) (ACUTE ONLY): 18 min   Charges:   PT Evaluation $PT Eval Low Complexity: 1 Low          Abia Monaco, PT  Acute Rehab Dept ([facility])  [phone]  06/08/2022   [facility] 06/08/2022, 4:03 PM

## 2022-06-08 NOTE — Progress Notes (Signed)
PROGRESS NOTE  [name] XLK:[id number] DOB: [birth year] DOA: 06/06/2022 PCP: Georgann Housekeeper, MD   LOS: 2 days   Brief Narrative / Interim history: 77 year old female with IDDM, CKD 3A, HTN, HLD, hypothyroidism, asthma, right renal cell cancer status post cryoablation, anxiety, OSA on CPAP comes into the hospital with cough and congestion for the past 3 days.  In the ED she was found to be febrile to 100.5, soft blood pressure 99/45 and imaging of the chest showed focal airspace consolidation in the central right upper lobe.  She was placed on antibiotics and admitted to the hospital.  Subjective / 24h Interval events: Still short of breath when getting up and moving around, on 1 L this morning.  Has been able to ambulate a little bit in the hallway.  Cough is bothering her.  Assesement and Plan: Principal Problem:   Right upper lobe consolidation (HCC) Active Problems:   Acute kidney injury superimposed on chronic kidney disease stage IIIa (HCC)   Hypertension associated with diabetes (HCC)   Controlled type 2 diabetes mellitus with stable proliferative retinopathy of both eyes, with long-term current use of insulin (HCC)   Hypothyroidism   Hyperlipidemia associated with type 2 diabetes mellitus (HCC)   Depression with anxiety   OSA (obstructive sleep apnea)  Principal problem Right upper lobe pneumonia -there is a concern for underlying pulmonary mass, however clinically, with her fever, recent cough it may very well be just pneumonia.  Continue antibiotics for now and supportive treatment.  Will need repeat chest imaging once treatment is complete to rule out an underlying mass, perhaps in 2 to 3 weeks as an outpatient -Mild hypoxemia was noted with sats of 90% on room air, and she was placed on 1 L.  Still on oxygen this morning, continue to attempt to wean off  Active problems Acute kidney injury on chronic kidney disease stage IIIa -this is likely due to poor p.o. intake in  the setting of her illness.  Baseline creatinine is around 1.2-1.5.  Creatinine on admission was 2.0, improved to 1.4 this morning after IV fluids.  Hold further fluids  Essential hypertension -hypotensive in the ED, continue metoprolol as her blood pressure overall is better  Hypothyroidism-continue Synthroid  OSA-continue CPAP  Anxiety-continue home Xanax  Type 2 diabetes mellitus, poorly controlled-continue sliding scale  Lab Results  Component Value Date   HGBA1C 7.9 (H) 09/23/2015   CBG (last 3)  Recent Labs    06/07/22 1706 06/07/22 2105 06/08/22 0740  GLUCAP 122* 132* 131*    Scheduled Meds:  atorvastatin  40 mg Oral QHS   doxycycline  100 mg Oral Q12H   enoxaparin (LOVENOX) injection  40 mg Subcutaneous Q24H   insulin aspart  0-15 Units Subcutaneous TID WC   insulin aspart  0-5 Units Subcutaneous QHS   levothyroxine  200 mcg Oral Q0600   metoprolol succinate  50 mg Oral Daily   pantoprazole  20 mg Oral BID   sodium chloride flush  3 mL Intravenous Q12H   Continuous Infusions:  cefTRIAXone (ROCEPHIN)  IV 2 g (06/08/22 0526)   PRN Meds:.acetaminophen **OR** acetaminophen, albuterol, ALPRAZolam, guaiFENesin-dextromethorphan, ondansetron **OR** ondansetron (ZOFRAN) IV, senna-docusate, traMADol  Current Outpatient Medications  Medication Instructions   albuterol (VENTOLIN HFA) 108 (90 Base) MCG/ACT inhaler 2 puffs, Inhalation, Every 6 hours PRN   ALPRAZolam (XANAX) 0.25-0.5 mg, Oral, 3 times daily PRN   amLODipine (NORVASC) 5 mg, Oral, Daily at bedtime   amoxicillin (AMOXIL) 500 mg, Oral, 3  times daily   aspirin EC 81 mg, Oral, Daily   atorvastatin (LIPITOR) 40 mg, Oral, Daily at bedtime   buPROPion (WELLBUTRIN XL) 150 mg, Every morning   busPIRone (BUSPAR) 5 mg, 2 times daily   candesartan (ATACAND) 32 mg, Oral, Daily at bedtime   chlorpheniramine-HYDROcodone (TUSSIONEX) 10-8 MG/5ML SUER 5 mLs, Oral, At bedtime PRN   cloNIDine (CATAPRES - DOSED IN MG/24 HR)  0.2 mg, Transdermal, Weekly   doxazosin (CARDURA) 4 mg, Oral, Daily   Farxiga 10 mg, Daily   fluticasone (FLONASE) 50 MCG/ACT nasal spray 2 sprays, Each Nare, Daily PRN   gabapentin (NEURONTIN) 300 mg, Oral, Daily at bedtime   HYCODAN 5-1.5 MG/5ML syrup 5 mLs, Oral, At bedtime PRN   hydrochlorothiazide (HYDRODIURIL) 50 mg, Oral, Daily   insulin glargine (LANTUS) 10 Units, Subcutaneous, Daily at bedtime   insulin lispro (HUMALOG) 15 Units, Subcutaneous, 3 times daily, Before meals   levothyroxine (SYNTHROID) 200 mcg, Oral, Daily before breakfast   lidocaine (LIDODERM) 5 % 1 patch, Transdermal, Every 24 hours, Remove & Discard patch within 12 hours or as directed by MD   methocarbamol (ROBAXIN) 500 mg, Oral, At bedtime PRN   metoprolol succinate (TOPROL-XL) 50 MG 24 hr tablet 1 tablet, Oral, Daily   MIEBO 1.338 GM/ML SOLN 1 drop, Both Eyes, 4 times daily   Mounjaro 10 mg, Subcutaneous, Weekly   ondansetron (ZOFRAN) 4 mg, Oral, Every 8 hours PRN   pantoprazole (PROTONIX) 40 mg, Oral, Daily   traMADol (ULTRAM) 50 mg, Oral, Daily PRN   Vitamin D (Ergocalciferol) (DRISDOL) 50,000 Units, Oral, Every 7 days, Thursdays    Diet Orders (From admission, onward)     Start     Ordered   06/06/22 2114  Diet heart healthy/carb modified Room service appropriate? Yes; Fluid consistency: Thin  Diet effective now       Question Answer Comment  Diet-HS Snack? Nothing   Room service appropriate? Yes   Fluid consistency: Thin      06/06/22 2113            DVT prophylaxis: enoxaparin (LOVENOX) injection 40 mg Start: 06/07/22 1000   Lab Results  Component Value Date   PLT 162 06/08/2022      Code Status: Full Code  Family Communication: No family at bedside  Status is: Inpatient Remains inpatient appropriate because: IV antibiotics  Level of care: Progressive  Consultants:  none  Objective: Vitals:   06/07/22 1407 06/07/22 2046 06/07/22 2215 06/08/22 0609  BP: (!) 149/68 (!)  169/72 (!) 148/73 (!) 155/87  Pulse: 62 69 67 76  Resp: 20 18  18   Temp: 97.8 F (36.6 C) 98.5 F (36.9 C)  98.9 F (37.2 C)  TempSrc: Oral Oral  Oral  SpO2: 99% 95%  93%  Weight:      Height:        Intake/Output Summary (Last 24 hours) at 06/08/2022 1007 Last data filed at 06/08/2022 1610 Gross per 24 hour  Intake 2840.01 ml  Output 150 ml  Net 2690.01 ml    Wt Readings from Last 3 Encounters:  06/06/22 (!) 139.3 kg  02/26/22 (!) 139.2 kg  02/08/22 (!) 140.6 kg    Examination:  Constitutional: NAD Eyes: lids and conjunctivae normal, no scleral icterus ENMT: mmm Neck: normal, supple Respiratory: clear to auscultation bilaterally, no wheezing, no crackles. Normal respiratory effort.  Cardiovascular: Regular rate and rhythm, no murmurs / rubs / gallops. No LE edema. Abdomen: soft, no distention, no tenderness. Bowel  sounds positive.   Data Reviewed: I have independently reviewed following labs and imaging studies   CBC Recent Labs  Lab 06/06/22 1450 06/06/22 2329 06/08/22 0417  WBC 8.8 7.4 6.3  HGB 11.2* 11.2* 11.7*  HCT 34.9* 35.5* 37.8  PLT 150 143* 162  MCV 90.9 93.2 94.5  MCH 29.2 29.4 29.3  MCHC 32.1 31.5 31.0  RDW 15.1 14.9 14.8  LYMPHSABS 2.3  --   --   MONOABS 0.9  --   --   EOSABS 0.1  --   --   BASOSABS 0.0  --   --      Recent Labs  Lab 06/06/22 1450 06/06/22 1451 06/06/22 2318 06/06/22 2329 06/08/22 0417  NA 138  --   --  138 138  K 4.0  --   --  3.5 3.6  CL 97*  --   --  104 104  CO2 34*  --   --  26 29  GLUCOSE 133*  --   --  137* 133*  BUN 45*  --   --  42* 28*  CREATININE 2.06*  --  1.85* 1.86* 1.41*  CALCIUM 9.1  --   --  8.4* 8.6*  AST 11*  --   --   --  14*  ALT 8  --   --   --  12  ALKPHOS 60  --   --   --  61  BILITOT 0.9  --   --   --  0.9  ALBUMIN 3.6  --   --   --  3.0*  MG  --   --   --   --  1.8  LATICACIDVEN  --  0.7  --   --   --       ------------------------------------------------------------------------------------------------------------------ No results for input(s): "CHOL", "HDL", "LDLCALC", "TRIG", "CHOLHDL", "LDLDIRECT" in the last 72 hours.  Lab Results  Component Value Date   HGBA1C 7.9 (H) 09/23/2015   ------------------------------------------------------------------------------------------------------------------ No results for input(s): "TSH", "T4TOTAL", "T3FREE", "THYROIDAB" in the last 72 hours.  Invalid input(s): "FREET3"  Cardiac Enzymes No results for input(s): "CKMB", "TROPONINI", "MYOGLOBIN" in the last 168 hours.  Invalid input(s): "CK" ------------------------------------------------------------------------------------------------------------------ No results found for: "BNP"  CBG: Recent Labs  Lab 06/07/22 0744 06/07/22 1149 06/07/22 1706 06/07/22 2105 06/08/22 0740  GLUCAP 135* 103* 122* 132* 131*     Recent Results (from the past 240 hour(s))  SARS Coronavirus 2 by RT PCR (hospital order, performed in [facility] hospital lab) *cepheid single result test* Anterior Nasal Swab     Status: None   Collection Time: 06/06/22  2:37 PM   Specimen: Anterior Nasal Swab  Result Value Ref Range Status   SARS Coronavirus 2 by RT PCR NEGATIVE NEGATIVE Final    Comment: (NOTE) SARS-CoV-2 target nucleic acids are NOT DETECTED.  The SARS-CoV-2 RNA is generally detectable in upper and lower respiratory specimens during the acute phase of infection. The lowest concentration of SARS-CoV-2 viral copies this assay can detect is 250 copies / mL. A negative result does not preclude SARS-CoV-2 infection and should not be used as the sole basis for treatment or other patient management decisions.  A negative result may occur with improper specimen collection / handling, submission of specimen other than nasopharyngeal swab, presence of viral mutation(s) within the areas targeted by this  assay, and inadequate number of viral copies (<250 copies / mL). A negative result must be combined with clinical observations, patient history, and epidemiological  information.  Fact Sheet for Patients:   RoadLapTop.co.za  Fact Sheet for Healthcare Providers: http://kim-miller.com/  This test is not yet approved or  cleared by the [place] FDA and has been authorized for detection and/or diagnosis of SARS-CoV-2 by FDA under an Emergency Use Authorization (EUA).  This EUA will remain in effect (meaning this test can be used) for the duration of the COVID-19 declaration under Section 564(b)(1) of the Act, 21 U.S.C. section 360bbb-3(b)(1), unless the authorization is terminated or revoked sooner.  Performed at [facility], [address] [place], [place] [address]   Culture, blood (routine x 2)     Status: None (Preliminary result)   Collection Time: 06/06/22  2:51 PM   Specimen: BLOOD  Result Value Ref Range Status   Specimen Description BLOOD LEFT ANTECUBITAL  Final   Special Requests   Final    BOTTLES DRAWN AEROBIC AND ANAEROBIC Blood Culture adequate volume   Culture   Final    NO GROWTH 2 DAYS Performed at [facility], [address] [place], [place] [address]    Report Status PENDING  Incomplete  Urine Culture     Status: None   Collection Time: 06/06/22  7:12 PM   Specimen: Urine, Clean Catch  Result Value Ref Range Status   Specimen Description   Final    URINE, CLEAN CATCH Performed at [facility], [address], [place], [place] [address]    Special Requests   Final    NONE Performed at [facility], [address] [place], [place] [address]    Culture   Final    NO GROWTH Performed at [facility], [address] [place], [place] [address]    Report Status 06/07/2022 FINAL  Final  MRSA Next Gen by PCR, Nasal     Status: None    Collection Time: 06/06/22 10:27 PM   Specimen: Nasal Mucosa; Nasal Swab  Result Value Ref Range Status   MRSA by PCR Next Gen NOT DETECTED NOT DETECTED Final    Comment: (NOTE) The GeneXpert MRSA Assay (FDA approved for NASAL specimens only), is one component of a comprehensive MRSA colonization surveillance program. It is not intended to diagnose MRSA infection nor to guide or monitor treatment for MRSA infections. Test performance is not FDA approved in patients less than 19 years old. Performed at [facility], [address], [place], [place] [address]      Radiology Studies: No results found.   Pamella Pert, MD, PhD [facility]  Between 7 am - 7 pm I am available, please contact me via Amion (for emergencies) or Securechat (non urgent messages)  Between 7 pm - 7 am I am not available, please contact night coverage MD/APP via Amion

## 2022-06-08 NOTE — Plan of Care (Signed)
  Problem: Coping: Goal: Level of anxiety will decrease Outcome: Progressing   Problem: Safety: Goal: Ability to remain free from injury will improve Outcome: Progressing   Problem: Coping: Goal: Ability to adjust to condition or change in health will improve Outcome: Progressing   Problem: Respiratory: Goal: Ability to maintain adequate ventilation will improve Outcome: Progressing Goal: Ability to maintain a clear airway will improve Outcome: Progressing

## 2022-06-08 NOTE — Progress Notes (Signed)
Transition of Care [facility]) - Inpatient Brief Assessment   Patient Details  Name: [name] MRN: [id number] Date of Birth: [birth year]  Transition of Care [facility]) CM/SW Contact:    Larrie Kass, LCSW Phone Number: 06/08/2022, 4:14 PM    Transition of Care Asessment: Insurance and Status: Insurance coverage has been reviewed Patient has primary care physician: Yes Home environment has been reviewed: lives with husband   Prior/Current Home Services: No current home services Social Determinants of Health Reivew: SDOH reviewed no interventions necessary Readmission risk has been reviewed: Yes Transition of care needs: no transition of care needs at this time

## 2022-06-09 DIAGNOSIS — J181 Lobar pneumonia, unspecified organism: Secondary | ICD-10-CM | POA: Diagnosis not present

## 2022-06-09 LAB — GLUCOSE, CAPILLARY
Glucose-Capillary: 119 mg/dL — ABNORMAL HIGH (ref 70–99)
Glucose-Capillary: 119 mg/dL — ABNORMAL HIGH (ref 70–99)
Glucose-Capillary: 114 mg/dL — ABNORMAL HIGH (ref 70–99)
Glucose-Capillary: 122 mg/dL — ABNORMAL HIGH (ref 70–99)

## 2022-06-09 LAB — CBC
HCT: 35.6 % — ABNORMAL LOW (ref 36.0–46.0)
Hemoglobin: 11 g/dL — ABNORMAL LOW (ref 12.0–15.0)
MCH: 29.2 pg (ref 26.0–34.0)
MCHC: 30.9 g/dL (ref 30.0–36.0)
MCV: 94.4 fL (ref 80.0–100.0)
Platelets: 160 10*3/uL (ref 150–400)
RBC: 3.77 MIL/uL — ABNORMAL LOW (ref 3.87–5.11)
RDW: 14.7 % (ref 11.5–15.5)
WBC: 5.9 10*3/uL (ref 4.0–10.5)
nRBC: 0 % (ref 0.0–0.2)

## 2022-06-09 LAB — BASIC METABOLIC PANEL
Anion gap: 5 (ref 5–15)
BUN: 22 mg/dL (ref 8–23)
CO2: 31 mmol/L (ref 22–32)
Calcium: 9 mg/dL (ref 8.9–10.3)
Chloride: 104 mmol/L (ref 98–111)
Creatinine, Ser: 1.41 mg/dL — ABNORMAL HIGH (ref 0.44–1.00)
GFR, Estimated: 39 mL/min — ABNORMAL LOW (ref >60)
Glucose, Bld: 120 mg/dL — ABNORMAL HIGH (ref 70–99)
Potassium: 3.7 mmol/L (ref 3.5–5.1)
Sodium: 140 mmol/L (ref 135–145)

## 2022-06-09 LAB — CULTURE, BLOOD (ROUTINE X 2): Special Requests: ADEQUATE

## 2022-06-09 LAB — LEGIONELLA PNEUMOPHILA SEROGP 1 UR AG: L. pneumophila Serogp 1 Ur Ag: NEGATIVE

## 2022-06-09 LAB — MAGNESIUM: Magnesium: 1.7 mg/dL (ref 1.7–2.4)

## 2022-06-09 MED ORDER — ALBUTEROL SULFATE (2.5 MG/3ML) 0.083% IN NEBU
2.5000 mg | INHALATION_SOLUTION | Freq: Three times a day (TID) | RESPIRATORY_TRACT | Status: DC
  Administered 2022-06-09 – 2022-06-11 (×7): 2.5 mg via RESPIRATORY_TRACT
  Filled 2022-06-09 (×7): qty 3

## 2022-06-09 MED ORDER — HYDROCOD POLI-CHLORPHE POLI ER 10-8 MG/5ML PO SUER
5.0000 mL | Freq: Two times a day (BID) | ORAL | Status: DC | PRN
  Administered 2022-06-10: 5 mL via ORAL
  Filled 2022-06-09: qty 5

## 2022-06-09 MED ORDER — MAGNESIUM SULFATE 2 GM/50ML IV SOLN
2.0000 g | Freq: Once | INTRAVENOUS | Status: AC
  Administered 2022-06-09: 2 g via INTRAVENOUS
  Filled 2022-06-09: qty 50

## 2022-06-09 MED ORDER — ALBUTEROL SULFATE (2.5 MG/3ML) 0.083% IN NEBU
2.5000 mg | INHALATION_SOLUTION | Freq: Two times a day (BID) | RESPIRATORY_TRACT | Status: DC
  Administered 2022-06-09: 2.5 mg via RESPIRATORY_TRACT
  Filled 2022-06-09: qty 3

## 2022-06-09 MED ORDER — AMLODIPINE BESYLATE 10 MG PO TABS
5.0000 mg | ORAL_TABLET | Freq: Every day | ORAL | Status: DC
  Administered 2022-06-09 – 2022-06-10 (×2): 5 mg via ORAL
  Filled 2022-06-09 (×2): qty 1

## 2022-06-09 NOTE — Progress Notes (Signed)
Mobility Specialist - Progress Note   06/09/22 1544  Oxygen Therapy  O2 Device Nasal Cannula  O2 Flow Rate (L/min) 2 L/min  Mobility  Activity Ambulated independently in hallway  Level of Assistance Independent  Assistive Device None  Distance Ambulated (ft) 80 ft  Activity Response Tolerated well  Mobility Referral Yes  $Mobility charge 1 Mobility  Mobility Specialist Start Time (ACUTE ONLY) [id number]  Mobility Specialist Stop Time (ACUTE ONLY) [id number]  Mobility Specialist Time Calculation (min) (ACUTE ONLY) 19 min   Pt received in recliner and agreeable to mobility. No complaints during session. Pt to recliner after session with all needs met.    Pre-mobility: 93% SpO2 During mobility: 92% SpO2 Post-mobility: 94% SPO2  Chief Technology Officer

## 2022-06-09 NOTE — Progress Notes (Signed)
PT refused cpap for the night. 

## 2022-06-09 NOTE — Progress Notes (Signed)
PROGRESS NOTE    [name]  ZOX:[id number] DOB: [birth year] DOA: 06/06/2022 PCP: Georgann Housekeeper, MD   Brief Narrative: 77 year old with past medical history significant for IDDM, CKD 3A, hypertension, hyperlipidemia, hypothyroidism, asthma, right renal cell cancer status post cryoablation, anxiety, OSA on CPAP presents complaining of cough and congestion for the last 3 days prior to admission.  Evaluation in the ED she was found to be febrile, blood pressure was soft in the 90s, chest x-ray showed focal airspace consolidation in the central right upper lobe.  Patient was admitted for pneumonia.    Assessment & Plan:   Principal Problem:   Right upper lobe consolidation (HCC) Active Problems:   Acute kidney injury superimposed on chronic kidney disease stage IIIa (HCC)   Hypertension associated with diabetes (HCC)   Controlled type 2 diabetes mellitus with stable proliferative retinopathy of both eyes, with long-term current use of insulin (HCC)   Hypothyroidism   Hyperlipidemia associated with type 2 diabetes mellitus (HCC)   Depression with anxiety   OSA (obstructive sleep apnea)   1-Right Upper Lobe PNA: Acute hypoxic Respiratory failure; oxygen at 88 % RA.  Presents with cough, CT chest:  Focal airspace consolidation versus ground-glass pulmonary mass measures 4.5 x 2.9 cm in the central right upper lobe. Mild ground-glass opacities in the left upper lobe centrally. These findings may represent multifocal pneumonia. Follow-up to resolution is recommended to exclude underlying neoplasm.  -Continue with IV ceftriaxone and Doxy -She will need repeat CT chest in 3 weeks to document resolution.   AKI on CKD IIIa:  Baseline Cr 1.2--1.5 Presents with Cr at 2.0. Improved after hydration.  Hold IV fluids.   HTN; Continue with Metoprolol.   Hypothyroidism: Continue with Synthroid.   OSA:On CPAP./   Anxiety: Continue with xanax, home regimen.   Types DM 2: SSI>    Hypomagnesemia; replete IV       Estimated body mass index is 46.68 kg/m as calculated from the following:   Height as of this encounter: 5\' 8"  (1.727 m).   Weight as of this encounter: 139.3 kg.   DVT prophylaxis: Lovenox Code Status: Full code Family Communication: Care discussed with patient  Disposition Plan:  Status is: Inpatient Remains inpatient appropriate because: management of PNA    Consultants:  None  Procedures:  None  Antimicrobials:    Subjective: She report cough, tussionex works better for cough.  She is breathing ok.   Objective: Vitals:   06/08/22 1538 06/08/22 1820 06/08/22 2110 06/09/22 0500  BP: 124/67 (!) 156/75 (!) 162/64 (!) 136/55  Pulse: (!) 57 (!) 57 (!) 58 61  Resp:   18 18  Temp: 98.4 F (36.9 C) 98.2 F (36.8 C) 98.6 F (37 C) 98.8 F (37.1 C)  TempSrc: Oral Oral Oral Oral  SpO2: 95% 99% 98% 97%  Weight:      Height:        Intake/Output Summary (Last 24 hours) at 06/09/2022 0810 Last data filed at 06/09/2022 0600 Gross per 24 hour  Intake 700 ml  Output --  Net 700 ml   Filed Weights   06/06/22 1854 06/06/22 2048  Weight: (!) 139.2 kg (!) 139.3 kg    Examination:  General exam: Appears calm and comfortable  Respiratory system: Clear to auscultation. Respiratory effort normal. Cardiovascular system: S1 & S2 heard, RRR.  Gastrointestinal system: Abdomen is nondistended, soft and nontender. No organomegaly or masses felt. Normal bowel sounds heard. Central nervous system: Alert and oriented.  Extremities: Symmetric 5 x 5 power.   Data Reviewed: I have personally reviewed following labs and imaging studies  CBC: Recent Labs  Lab 06/06/22 1450 06/06/22 2329 06/08/22 0417 06/09/22 0432  WBC 8.8 7.4 6.3 5.9  NEUTROABS 5.5  --   --   --   HGB 11.2* 11.2* 11.7* 11.0*  HCT 34.9* 35.5* 37.8 35.6*  MCV 90.9 93.2 94.5 94.4  PLT 150 143* 162 160   Basic Metabolic Panel: Recent Labs  Lab 06/06/22 1450  06/06/22 2318 06/06/22 2329 06/08/22 0417 06/09/22 0432  NA 138  --  138 138 140  K 4.0  --  3.5 3.6 3.7  CL 97*  --  104 104 104  CO2 34*  --  26 29 31   GLUCOSE 133*  --  137* 133* 120*  BUN 45*  --  42* 28* 22  CREATININE 2.06* 1.85* 1.86* 1.41* 1.41*  CALCIUM 9.1  --  8.4* 8.6* 9.0  MG  --   --   --  1.8 1.7   GFR: Estimated Creatinine Clearance: 51.2 mL/min (A) (by C-G formula based on SCr of 1.41 mg/dL (H)). Liver Function Tests: Recent Labs  Lab 06/06/22 1450 06/08/22 0417  AST 11* 14*  ALT 8 12  ALKPHOS 60 61  BILITOT 0.9 0.9  PROT 7.0 7.2  ALBUMIN 3.6 3.0*   Recent Labs  Lab 06/06/22 1450  LIPASE 72*   No results for input(s): "AMMONIA" in the last 168 hours. Coagulation Profile: No results for input(s): "INR", "PROTIME" in the last 168 hours. Cardiac Enzymes: No results for input(s): "CKTOTAL", "CKMB", "CKMBINDEX", "TROPONINI" in the last 168 hours. BNP (last 3 results) No results for input(s): "PROBNP" in the last 8760 hours. HbA1C: No results for input(s): "HGBA1C" in the last 72 hours. CBG: Recent Labs  Lab 06/08/22 0740 06/08/22 1217 06/08/22 1720 06/08/22 2105 06/09/22 0736  GLUCAP 131* 110* 118* 154* 119*   Lipid Profile: No results for input(s): "CHOL", "HDL", "LDLCALC", "TRIG", "CHOLHDL", "LDLDIRECT" in the last 72 hours. Thyroid Function Tests: No results for input(s): "TSH", "T4TOTAL", "FREET4", "T3FREE", "THYROIDAB" in the last 72 hours. Anemia Panel: No results for input(s): "VITAMINB12", "FOLATE", "FERRITIN", "TIBC", "IRON", "RETICCTPCT" in the last 72 hours. Sepsis Labs: Recent Labs  Lab 06/06/22 1451  LATICACIDVEN 0.7    Recent Results (from the past 240 hour(s))  SARS Coronavirus 2 by RT PCR (hospital order, performed in [facility] hospital lab) *cepheid single result test* Anterior Nasal Swab     Status: None   Collection Time: 06/06/22  2:37 PM   Specimen: Anterior Nasal Swab  Result Value Ref Range Status   SARS  Coronavirus 2 by RT PCR NEGATIVE NEGATIVE Final    Comment: (NOTE) SARS-CoV-2 target nucleic acids are NOT DETECTED.  The SARS-CoV-2 RNA is generally detectable in upper and lower respiratory specimens during the acute phase of infection. The lowest concentration of SARS-CoV-2 viral copies this assay can detect is 250 copies / mL. A negative result does not preclude SARS-CoV-2 infection and should not be used as the sole basis for treatment or other patient management decisions.  A negative result may occur with improper specimen collection / handling, submission of specimen other than nasopharyngeal swab, presence of viral mutation(s) within the areas targeted by this assay, and inadequate number of viral copies (<250 copies / mL). A negative result must be combined with clinical observations, patient history, and epidemiological information.  Fact Sheet for Patients:   RoadLapTop.co.za  Fact Sheet  for Healthcare Providers: http://kim-miller.com/  This test is not yet approved or  cleared by the [place] and has been authorized for detection and/or diagnosis of SARS-CoV-2 by FDA under an Emergency Use Authorization (EUA).  This EUA will remain in effect (meaning this test can be used) for the duration of the COVID-19 declaration under Section 564(b)(1) of the Act, 21 U.S.C. section 360bbb-3(b)(1), unless the authorization is terminated or revoked sooner.  Performed at [facility], [address] [place], [place] [address]   Culture, blood (routine x 2)     Status: None (Preliminary result)   Collection Time: 06/06/22  2:51 PM   Specimen: BLOOD  Result Value Ref Range Status   Specimen Description BLOOD LEFT ANTECUBITAL  Final   Special Requests   Final    BOTTLES DRAWN AEROBIC AND ANAEROBIC Blood Culture adequate volume   Culture   Final    NO GROWTH 3 DAYS Performed at [facility], [address] [place], [place] [address]    Report Status PENDING  Incomplete  Urine Culture     Status: None   Collection Time: 06/06/22  7:12 PM   Specimen: Urine, Clean Catch  Result Value Ref Range Status   Specimen Description   Final    URINE, CLEAN CATCH Performed at [facility], [address], [place], [place] [address]    Special Requests   Final    NONE Performed at [facility], [address] [place], [place] [address]    Culture   Final    NO GROWTH Performed at [facility], [address] [place], [place] [address]    Report Status 06/07/2022 FINAL  Final  MRSA Next Gen by PCR, Nasal     Status: None   Collection Time: 06/06/22 10:27 PM   Specimen: Nasal Mucosa; Nasal Swab  Result Value Ref Range Status   MRSA by PCR Next Gen NOT DETECTED NOT DETECTED Final    Comment: (NOTE) The GeneXpert MRSA Assay (FDA approved for NASAL specimens only), is one component of a comprehensive MRSA colonization surveillance program. It is not intended to diagnose MRSA infection nor to guide or monitor treatment for MRSA infections. Test performance is not FDA approved in patients less than 58 years old. Performed at [facility], [address] [place], [place] [address]          Radiology Studies: No results found.      Scheduled Meds:  atorvastatin  40 mg Oral QHS   doxycycline  100 mg Oral Q12H   enoxaparin (LOVENOX) injection  40 mg Subcutaneous Q24H   insulin aspart  0-15 Units Subcutaneous TID WC   insulin aspart  0-5 Units Subcutaneous QHS   levothyroxine  200 mcg Oral Q0600   metoprolol succinate  50 mg Oral Daily   pantoprazole  20 mg Oral BID   sodium chloride flush  3 mL Intravenous Q12H   Continuous Infusions:  cefTRIAXone (ROCEPHIN)  IV 2 g (06/09/22 0520)     LOS: 3 days    Time spent: 35 minutes    Antania Hoefling A Allexus Ovens, MD [facility]   If 7PM-7AM, please contact  night-coverage [email or web address]  06/09/2022, 8:10 AM

## 2022-06-09 NOTE — Progress Notes (Signed)
Mobility Specialist - Progress Note   06/09/22 1103  Oxygen Therapy  SpO2 94 %  O2 Device Nasal Cannula  O2 Flow Rate (L/min) 2 L/min  Mobility  Activity Ambulated independently in hallway  Level of Assistance Independent  Assistive Device None  Distance Ambulated (ft) 100 ft  Activity Response Tolerated well  Mobility Referral Yes  $Mobility charge 1 Mobility  Mobility Specialist Start Time (ACUTE ONLY) 1046  Mobility Specialist Stop Time (ACUTE ONLY) 1100  Mobility Specialist Time Calculation (min) (ACUTE ONLY) 14 min   Pt received in recliner and agreeable to mobility. Upon arrival to room pt O2 was at 88% on RA. [name] put back on 2L allowing O2 to come back up to 90%. No complaints during session. Pt to recliner after session with all needs met.    Pre-mobility: 88%-90% SpO2 (RA) During mobility: 94% SpO2 (2L [place]) Post-mobility: 93% SPO2 (2L [place])  Chief Technology Officer

## 2022-06-09 NOTE — Progress Notes (Signed)
SATURATION QUALIFICATIONS: (This note is used to comply with regulatory documentation for home oxygen)  Patient Saturations on Room Air at Rest = 96%  Patient Saturations on Room Air while Ambulating = 89%  Patient Saturations on 1 Liters of oxygen while Ambulating = 96%

## 2022-06-10 ENCOUNTER — Inpatient Hospital Stay (HOSPITAL_COMMUNITY): Payer: Medicare Other

## 2022-06-10 DIAGNOSIS — J181 Lobar pneumonia, unspecified organism: Secondary | ICD-10-CM | POA: Diagnosis not present

## 2022-06-10 LAB — BASIC METABOLIC PANEL
Anion gap: 4 — ABNORMAL LOW (ref 5–15)
BUN: 18 mg/dL (ref 8–23)
CO2: 30 mmol/L (ref 22–32)
Calcium: 8.6 mg/dL — ABNORMAL LOW (ref 8.9–10.3)
Chloride: 104 mmol/L (ref 98–111)
Creatinine, Ser: 1.14 mg/dL — ABNORMAL HIGH (ref 0.44–1.00)
GFR, Estimated: 50 mL/min — ABNORMAL LOW (ref >60)
Glucose, Bld: 130 mg/dL — ABNORMAL HIGH (ref 70–99)
Potassium: 3.9 mmol/L (ref 3.5–5.1)
Sodium: 138 mmol/L (ref 135–145)

## 2022-06-10 LAB — GLUCOSE, CAPILLARY
Glucose-Capillary: 121 mg/dL — ABNORMAL HIGH (ref 70–99)
Glucose-Capillary: 154 mg/dL — ABNORMAL HIGH (ref 70–99)
Glucose-Capillary: 132 mg/dL — ABNORMAL HIGH (ref 70–99)
Glucose-Capillary: 156 mg/dL — ABNORMAL HIGH (ref 70–99)

## 2022-06-10 LAB — MAGNESIUM: Magnesium: 1.7 mg/dL (ref 1.7–2.4)

## 2022-06-10 MED ORDER — MAGNESIUM SULFATE 2 GM/50ML IV SOLN
2.0000 g | Freq: Once | INTRAVENOUS | Status: AC
  Administered 2022-06-10: 2 g via INTRAVENOUS
  Filled 2022-06-10: qty 50

## 2022-06-10 MED ORDER — FUROSEMIDE 10 MG/ML IJ SOLN
40.0000 mg | Freq: Once | INTRAMUSCULAR | Status: AC
  Administered 2022-06-10: 40 mg via INTRAVENOUS
  Filled 2022-06-10: qty 4

## 2022-06-10 NOTE — Progress Notes (Signed)
PT Cancellation Note  Patient Details Name: ALAJIAH VISELLI MRN: [id number] DOB: [birth year]   Cancelled Treatment:    Reason Eval/Treat Not Completed: Patient at procedure or test/unavailable.  Nursing unsure of return time, reattempt as time and pt allow.   Ivar Drape 06/10/2022, 2:54 PM  Samul Dada, PT PhD Acute Rehab Dept. Number: [facility] [id number] and [facility] [phone]

## 2022-06-10 NOTE — Progress Notes (Signed)
PROGRESS NOTE    [name]  VWU:[id number] DOB: Sep 18, 1945 DOA: 06/06/2022 PCP: Georgann Housekeeper, MD   Brief Narrative: 77 year old with past medical history significant for IDDM, CKD 3A, hypertension, hyperlipidemia, hypothyroidism, asthma, right renal cell cancer status post cryoablation, anxiety, OSA on CPAP presents complaining of cough and congestion for the last 3 days prior to admission.  Evaluation in the ED she was found to be febrile, blood pressure was soft in the 90s, chest x-ray showed focal airspace consolidation in the central right upper lobe.  Patient was admitted for pneumonia.    Assessment & Plan:   Principal Problem:   Right upper lobe consolidation (HCC) Active Problems:   Acute kidney injury superimposed on chronic kidney disease stage IIIa (HCC)   Hypertension associated with diabetes (HCC)   Controlled type 2 diabetes mellitus with stable proliferative retinopathy of both eyes, with long-term current use of insulin (HCC)   Hypothyroidism   Hyperlipidemia associated with type 2 diabetes mellitus (HCC)   Depression with anxiety   OSA (obstructive sleep apnea)   1-Right Upper Lobe PNA: Acute hypoxic Respiratory failure; oxygen at 88 % RA.  -Presents with cough, CT chest:  Focal airspace consolidation versus ground-glass pulmonary mass measures 4.5 x 2.9 cm in the central right upper lobe. Mild ground-glass opacities in the left upper lobe centrally. These findings may represent multifocal pneumonia. Follow-up to resolution is recommended to exclude underlying neoplasm.  -Continue with IV ceftriaxone and Doxy, day 4 antibiotics.  -She will need repeat CT chest in 3 weeks to document resolution.  -Back on oxygen, plan to repeat chest x ray.  -Continue with nebulizer. She feels nebulizer helps   AKI on CKD IIIa:  Baseline Cr 1.2--1.5 Presents with Cr at 2.0. Improved after hydration.  Hold IV fluids.   HTN; Continue with Metoprolol.   Hypothyroidism:  Continue with Synthroid.   OSA:On CPAP./   Anxiety: Continue with xanax, home regimen.   Types DM 2: SSI>   Hypomagnesemia; replete IV Check levels.     Estimated body mass index is 46.68 kg/m as calculated from the following:   Height as of this encounter: 5\' 8"  (1.727 m).   Weight as of this encounter: 139.3 kg.   DVT prophylaxis: Lovenox Code Status: Full code Family Communication: Care discussed with patient  Disposition Plan:  Status is: Inpatient Remains inpatient appropriate because: management of PNA    Consultants:  None  Procedures:  None  Antimicrobials:    Subjective: Breathing treatment helping with cough. She is back on oxygen this am. Oxygen was 90 on RA.    Objective: Vitals:   06/09/22 1800 06/09/22 2117 06/10/22 0513 06/10/22 1143  BP:  (!) 145/76 133/67 136/63  Pulse:  (!) 59 71 (!) 58  Resp:  19 19 18   Temp:  98.9 F (37.2 C) 98.9 F (37.2 C) 98.4 F (36.9 C)  TempSrc:  Oral Oral Oral  SpO2: 98% 98% 91% 96%  Weight:      Height:        Intake/Output Summary (Last 24 hours) at 06/10/2022 1238 Last data filed at 06/10/2022 0900 Gross per 24 hour  Intake 999.29 ml  Output --  Net 999.29 ml    Filed Weights   06/06/22 1854 06/06/22 2048  Weight: (!) 139.2 kg (!) 139.3 kg    Examination:  General exam: NAD Respiratory system: BL air movement.  Cardiovascular system: S 1,S 2 RRR Gastrointestinal system: BS present, soft, nt Central nervous system: Alert,  oriented.  Extremities: Symmetric 5 x 5 power.   Data Reviewed: I have personally reviewed following labs and imaging studies  CBC: Recent Labs  Lab 06/06/22 1450 06/06/22 2329 06/08/22 0417 06/09/22 0432  WBC 8.8 7.4 6.3 5.9  NEUTROABS 5.5  --   --   --   HGB 11.2* 11.2* 11.7* 11.0*  HCT 34.9* 35.5* 37.8 35.6*  MCV 90.9 93.2 94.5 94.4  PLT 150 143* 162 160    Basic Metabolic Panel: Recent Labs  Lab 06/06/22 1450 06/06/22 2318 06/06/22 2329  06/08/22 0417 06/09/22 0432  NA 138  --  138 138 140  K 4.0  --  3.5 3.6 3.7  CL 97*  --  104 104 104  CO2 34*  --  26 29 31   GLUCOSE 133*  --  137* 133* 120*  BUN 45*  --  42* 28* 22  CREATININE 2.06* 1.85* 1.86* 1.41* 1.41*  CALCIUM 9.1  --  8.4* 8.6* 9.0  MG  --   --   --  1.8 1.7    GFR: Estimated Creatinine Clearance: 51.2 mL/min (A) (by C-G formula based on SCr of 1.41 mg/dL (H)). Liver Function Tests: Recent Labs  Lab 06/06/22 1450 06/08/22 0417  AST 11* 14*  ALT 8 12  ALKPHOS 60 61  BILITOT 0.9 0.9  PROT 7.0 7.2  ALBUMIN 3.6 3.0*    Recent Labs  Lab 06/06/22 1450  LIPASE 72*    No results for input(s): "AMMONIA" in the last 168 hours. Coagulation Profile: No results for input(s): "INR", "PROTIME" in the last 168 hours. Cardiac Enzymes: No results for input(s): "CKTOTAL", "CKMB", "CKMBINDEX", "TROPONINI" in the last 168 hours. BNP (last 3 results) No results for input(s): "PROBNP" in the last 8760 hours. HbA1C: No results for input(s): "HGBA1C" in the last 72 hours. CBG: Recent Labs  Lab 06/09/22 1201 06/09/22 1721 06/09/22 2214 06/10/22 0737 06/10/22 1139  GLUCAP 119* 114* 122* 121* 154*    Lipid Profile: No results for input(s): "CHOL", "HDL", "LDLCALC", "TRIG", "CHOLHDL", "LDLDIRECT" in the last 72 hours. Thyroid Function Tests: No results for input(s): "TSH", "T4TOTAL", "FREET4", "T3FREE", "THYROIDAB" in the last 72 hours. Anemia Panel: No results for input(s): "VITAMINB12", "FOLATE", "FERRITIN", "TIBC", "IRON", "RETICCTPCT" in the last 72 hours. Sepsis Labs: Recent Labs  Lab 06/06/22 1451  LATICACIDVEN 0.7     Recent Results (from the past 240 hour(s))  SARS Coronavirus 2 by RT PCR (hospital order, performed in [facility] hospital lab) *cepheid single result test* Anterior Nasal Swab     Status: None   Collection Time: 06/06/22  2:37 PM   Specimen: Anterior Nasal Swab  Result Value Ref Range Status   SARS Coronavirus 2 by RT PCR  NEGATIVE NEGATIVE Final    Comment: (NOTE) SARS-CoV-2 target nucleic acids are NOT DETECTED.  The SARS-CoV-2 RNA is generally detectable in upper and lower respiratory specimens during the acute phase of infection. The lowest concentration of SARS-CoV-2 viral copies this assay can detect is 250 copies / mL. A negative result does not preclude SARS-CoV-2 infection and should not be used as the sole basis for treatment or other patient management decisions.  A negative result may occur with improper specimen collection / handling, submission of specimen other than nasopharyngeal swab, presence of viral mutation(s) within the areas targeted by this assay, and inadequate number of viral copies (<250 copies / mL). A negative result must be combined with clinical observations, patient history, and epidemiological information.  Fact Sheet  for Patients:   RoadLapTop.co.za  Fact Sheet for Healthcare Providers: http://kim-miller.com/  This test is not yet approved or  cleared by the [place] FDA and has been authorized for detection and/or diagnosis of SARS-CoV-2 by FDA under an Emergency Use Authorization (EUA).  This EUA will remain in effect (meaning this test can be used) for the duration of the COVID-19 declaration under Section 564(b)(1) of the Act, 21 U.S.C. section 360bbb-3(b)(1), unless the authorization is terminated or revoked sooner.  Performed at [facility], [address] [place], [place] [address]   Culture, blood (routine x 2)     Status: None (Preliminary result)   Collection Time: 06/06/22  2:51 PM   Specimen: BLOOD  Result Value Ref Range Status   Specimen Description BLOOD LEFT ANTECUBITAL  Final   Special Requests   Final    BOTTLES DRAWN AEROBIC AND ANAEROBIC Blood Culture adequate volume   Culture   Final    NO GROWTH 4 DAYS Performed at [facility], [address], [place],  [place] [address]    Report Status PENDING  Incomplete  Urine Culture     Status: None   Collection Time: 06/06/22  7:12 PM   Specimen: Urine, Clean Catch  Result Value Ref Range Status   Specimen Description   Final    URINE, CLEAN CATCH Performed at [facility], [address], [place], [place] [address]    Special Requests   Final    NONE Performed at [facility], [address] [place], [place] [address]    Culture   Final    NO GROWTH Performed at [facility], [address] [place], [place] [address]    Report Status 06/07/2022 FINAL  Final  MRSA Next Gen by PCR, Nasal     Status: None   Collection Time: 06/06/22 10:27 PM   Specimen: Nasal Mucosa; Nasal Swab  Result Value Ref Range Status   MRSA by PCR Next Gen NOT DETECTED NOT DETECTED Final    Comment: (NOTE) The GeneXpert MRSA Assay (FDA approved for NASAL specimens only), is one component of a comprehensive MRSA colonization surveillance program. It is not intended to diagnose MRSA infection nor to guide or monitor treatment for MRSA infections. Test performance is not FDA approved in patients less than 23 years old. Performed at [facility], [address] [place], [place] [address]          Radiology Studies: No results found.      Scheduled Meds:  albuterol  2.5 mg Nebulization TID   amLODipine  5 mg Oral QHS   atorvastatin  40 mg Oral QHS   doxycycline  100 mg Oral Q12H   enoxaparin (LOVENOX) injection  40 mg Subcutaneous Q24H   insulin aspart  0-15 Units Subcutaneous TID WC   insulin aspart  0-5 Units Subcutaneous QHS   levothyroxine  200 mcg Oral Q0600   metoprolol succinate  50 mg Oral Daily   pantoprazole  20 mg Oral BID   sodium chloride flush  3 mL Intravenous Q12H   Continuous Infusions:  cefTRIAXone (ROCEPHIN)  IV 2 g (06/10/22 0615)     LOS: 4 days    Time spent: 35 minutes    Sarika Baldini A Mahasin Riviere, MD [facility]   If 7PM-7AM, please contact night-coverage [email or web address]  06/10/2022, 12:38 PM

## 2022-06-10 NOTE — Progress Notes (Signed)
Pt refused CPAP qhs.  Pt states she can only tolerate nasal pillows, which we are unable to provide. I informed the Pt that she may have her nasal pillows brought from home to use with our machine, but she prefers to sleep in the recliner while wearing her nasal cannula.  Pt encouraged to contact RT should she change her mind.

## 2022-06-10 NOTE — Care Management Important Message (Signed)
Important Message  Patient Details IM Letter given Name: [name] MRN: [id number] Date of Birth: [birth year]   Medicare Important Message Given:  Yes     [name] 06/10/2022, 11:18 AM

## 2022-06-11 DIAGNOSIS — J181 Lobar pneumonia, unspecified organism: Secondary | ICD-10-CM | POA: Diagnosis not present

## 2022-06-11 LAB — BASIC METABOLIC PANEL
Anion gap: 8 (ref 5–15)
BUN: 18 mg/dL (ref 8–23)
CO2: 34 mmol/L — ABNORMAL HIGH (ref 22–32)
Calcium: 9.2 mg/dL (ref 8.9–10.3)
Chloride: 98 mmol/L (ref 98–111)
Creatinine, Ser: 1.19 mg/dL — ABNORMAL HIGH (ref 0.44–1.00)
GFR, Estimated: 48 mL/min — ABNORMAL LOW (ref >60)
Glucose, Bld: 125 mg/dL — ABNORMAL HIGH (ref 70–99)
Potassium: 3.8 mmol/L (ref 3.5–5.1)
Sodium: 140 mmol/L (ref 135–145)

## 2022-06-11 LAB — GLUCOSE, CAPILLARY
Glucose-Capillary: 109 mg/dL — ABNORMAL HIGH (ref 70–99)
Glucose-Capillary: 130 mg/dL — ABNORMAL HIGH (ref 70–99)

## 2022-06-11 MED ORDER — FUROSEMIDE 10 MG/ML IJ SOLN
40.0000 mg | Freq: Once | INTRAMUSCULAR | Status: AC
  Administered 2022-06-11: 40 mg via INTRAVENOUS
  Filled 2022-06-11: qty 4

## 2022-06-11 MED ORDER — DOXYCYCLINE HYCLATE 100 MG PO TABS: 100.0000 mg | ORAL_TABLET | Freq: Two times a day (BID) | ORAL | 0 refills | Status: AC

## 2022-06-11 MED ORDER — SODIUM CHLORIDE 0.9 % IV SOLN: 2.0000 g | INTRAVENOUS | Status: DC

## 2022-06-11 MED ORDER — FLUCONAZOLE 150 MG PO TABS
150.0000 mg | ORAL_TABLET | Freq: Once | ORAL | Status: DC
  Filled 2022-06-11: qty 1

## 2022-06-11 MED ORDER — POTASSIUM CHLORIDE CRYS ER 20 MEQ PO TBCR
40.0000 meq | EXTENDED_RELEASE_TABLET | Freq: Once | ORAL | Status: AC
  Administered 2022-06-11: 40 meq via ORAL
  Filled 2022-06-11: qty 2

## 2022-06-11 MED ORDER — MAGNESIUM 200 MG PO TABS: 400.0000 mg | ORAL_TABLET | Freq: Two times a day (BID) | ORAL | 0 refills | Status: AC

## 2022-06-11 MED ORDER — CEFDINIR 300 MG PO CAPS: 300.0000 mg | ORAL_CAPSULE | Freq: Two times a day (BID) | ORAL | 0 refills | Status: AC

## 2022-06-11 NOTE — Progress Notes (Signed)
Physical Therapy Treatment and Discharge from Acute PT Patient Details Name: [name] MRN: [id number] DOB: 1945/06/29 Today's Date: 06/11/2022   History of Present Illness 77 year old female with comes into the hospital with cough and congestion, imaging of the chest showed focal airspace consolidation in the central right upper lobe; admitted with RUL pna  PMH: IDDM, CKD 3A, HTN, HLD, hypothyroidism, asthma, right renal cell cancer status post cryoablation, anxiety, OSA on CPAP    PT Comments    Pt requested to use BSC prior to ambulating and requested therapist exit room.  Pt able to mobilize in room independently.  Pt's SpO2 90-92% on room air with ambulating in hallway.  Pt would benefit from a little UE support with mobilizing and encouraged use of assistive at home.  She states she does have a cane and rollator.  Pt agreeable to no further acute PT needs at this time.  PT to sign off.  Pt would benefit from continuing to mobilize with staff during this admission.     Recommendations for follow up therapy are one component of a multi-disciplinary discharge planning process, led by the attending physician.  Recommendations may be updated based on patient status, additional functional criteria and insurance authorization.  Follow Up Recommendations       Assistance Recommended at Discharge PRN  Patient can return home with the following Assistance with cooking/housework;Assist for transportation;Help with stairs or ramp for entrance   Equipment Recommendations  None recommended by PT    Recommendations for Other Services       Precautions / Restrictions Precautions Precautions: Fall Precaution Comments: monitor sats     Mobility  Bed Mobility               General bed mobility comments: in recliner on arrival    Transfers   Equipment used: None Transfers: Sit to/from Stand Sit to Stand: Modified independent (Device/Increase time)                 Ambulation/Gait Ambulation/Gait assistance: Supervision, Modified independent (Device/Increase time) Gait Distance (Feet): 120 Feet Assistive device: None Gait Pattern/deviations: Decreased stride length, Wide base of support, Step-through pattern       General Gait Details: pushed dynamap (declined using RW), SpO2 90-92% on room air during session   Stairs             Wheelchair Mobility    Modified Rankin (Stroke Patients Only)       Balance Overall balance assessment: Mild deficits observed, not formally tested                                          Cognition Arousal/Alertness: Awake/alert Behavior During Therapy: WFL for tasks assessed/performed Overall Cognitive Status: Within Functional Limits for tasks assessed                                          Exercises      General Comments        Pertinent Vitals/Pain Pain Assessment Pain Assessment: No/denies pain    Home Living                          Prior Function  PT Goals (current goals can now be found in the care plan section) Progress towards PT goals: Progressing toward goals    Frequency           PT Plan Current plan remains appropriate    Co-evaluation              AM-PAC PT "6 Clicks" Mobility   Outcome Measure  Help needed turning from your back to your side while in a flat bed without using bedrails?: None Help needed moving from lying on your back to sitting on the side of a flat bed without using bedrails?: None Help needed moving to and from a bed to a chair (including a wheelchair)?: None Help needed standing up from a chair using your arms (e.g., wheelchair or bedside chair)?: None Help needed to walk in hospital room?: A Little Help needed climbing 3-5 steps with a railing? : A Little 6 Click Score: 22    End of Session   Activity Tolerance: Patient tolerated treatment well Patient left: in  chair;with call bell/phone within reach   PT Visit Diagnosis: Difficulty in walking, not elsewhere classified (R26.2)     Time: 1610-9604 PT Time Calculation (min) (ACUTE ONLY): 11 min  Charges:  $Gait Training: 8-22 mins                     Paulino Door, DPT Physical Therapist Acute Rehabilitation Services Office: [phone]    Janan Halter [place] 06/11/2022, 2:35 PM

## 2022-06-11 NOTE — TOC Transition Note (Signed)
Transition of Care [facility]) - CM/SW Discharge Note   Patient Details  Name: [name] MRN: [id number] Date of Birth: [birth year]  [facility] CM/SW Contact:  Larrie Kass, LCSW Phone Number: 06/11/2022, 4:32 PM   Clinical Narrative:    CSW spoke with pt regarding DME orders for CPAP. Pt reports she received services from [facility] but they no longer able to provide CPAP machines or maintain them. CSW spoke with [facility], who stated pt will have to contact a different DME company. CSW spoke with Morrie Sheldon from [place], he reports they can accept pt's insurance. He stated Pt will need to call and initiate a medical record request. CSW provided [employer] contact number to pt. CSW also informed pt she will need to follow up with her PCP. Pt deny any transportation needs. No Additional TOC needs TOC sign-off.       Final next level of care: Home/Self Care Barriers to Discharge: Barriers Resolved   Patient Goals and CMS Choice      Discharge Placement                      Patient and family notified of of transfer: 06/11/22  Discharge Plan and Services Additional resources added to the After Visit Summary for                                       Social Determinants of Health (SDOH) Interventions SDOH Screenings   Food Insecurity: No Food Insecurity (06/06/2022)  Housing: Low Risk  (06/06/2022)  Transportation Needs: No Transportation Needs (06/06/2022)  Utilities: Not At Risk (06/06/2022)  Tobacco Use: Low Risk  (06/06/2022)     Readmission Risk Interventions     No data to display

## 2022-06-11 NOTE — Discharge Summary (Signed)
Physician Discharge Summary   Patient: [name] MRN: [id number] DOB: 1945/10/27  Admit date:     06/06/2022  Discharge date: 06/11/22  Discharge Physician: Alba Cory   PCP: Georgann Housekeeper, MD   Recommendations at discharge:   Needs CT chest to follow resolution of PNA and ruled out mass Needs Bmet to monitor renal function.   Discharge Diagnoses: Principal Problem:   Right upper lobe consolidation (HCC) Active Problems:   Acute kidney injury superimposed on chronic kidney disease stage IIIa (HCC)   Hypertension associated with diabetes (HCC)   Controlled type 2 diabetes mellitus with stable proliferative retinopathy of both eyes, with long-term current use of insulin (HCC)   Hypothyroidism   Hyperlipidemia associated with type 2 diabetes mellitus (HCC)   Depression with anxiety   OSA (obstructive sleep apnea)  Resolved Problems:   * No resolved hospital problems. *  Hospital Course: 77 year old with past medical history significant for IDDM, CKD 3A, hypertension, hyperlipidemia, hypothyroidism, asthma, right renal cell cancer status post cryoablation, anxiety, OSA on CPAP presents complaining of cough and congestion for the last 3 days prior to admission.  Evaluation in the ED she was found to be febrile, blood pressure was soft in the 90s, chest x-ray showed focal airspace consolidation in the central right upper lobe.  Patient was admitted for pneumonia.     Assessment and Plan: 1-Right Upper Lobe PNA: Acute hypoxic Respiratory failure; oxygen at 88 % RA.  -Presents with cough, CT chest:  Focal airspace consolidation versus ground-glass pulmonary mass measures 4.5 x 2.9 cm in the central right upper lobe. Mild ground-glass opacities in the left upper lobe centrally. These findings may represent multifocal pneumonia. Follow-up to resolution is recommended to exclude underlying neoplasm.  -Continue with IV ceftriaxone and Doxy, day 4 antibiotics.  -She will need  repeat CT chest in 3 weeks to document resolution.  -Chest x ray with pulmonary edema 5/30.Received IV lasix 5/30 and 5/31.  No further hypoxia. She will be discharge on cefdinir and Doxy to complete total of 10 days. Resume home dose HCTZ -Continue with nebulizer. She feels nebulizer helps    AKI on CKD IIIa:  Baseline Cr 1.2--1.5 Presents with Cr at 2.0. Improved after hydration.  Hold IV fluids.  Continue to hold Farxiga until follow up with PCP>   HTN; Continue with Metoprolol.    Hypothyroidism: Continue with Synthroid.    OSA:On CPAP./    Anxiety: Continue with xanax, home regimen.    Types DM 2: SSI>    Hypomagnesemia; Replaced.  Discharge on Supplement.            Consultants: None Procedures performed: None Disposition: Home Diet recommendation:  Discharge Diet Orders (From admission, onward)     Start     Ordered   06/11/22 0000  Diet - low sodium heart healthy        06/11/22 1541           Carb modified diet DISCHARGE MEDICATION: Allergies as of 06/11/2022       Reactions   Esomeprazole Magnesium Shortness Of Breath   Aliskiren Other (See Comments)   Lips tingling   Augmentin [amoxicillin-pot Clavulanate]    Has patient had a PCN reaction causing immediate rash, facial/tongue/throat swelling, SOB or lightheadedness with hypotension: Unknown Has patient had a PCN reaction causing severe rash involving mucus membranes or skin necrosis: Unknown Has patient had a PCN reaction that required hospitalization:Unknown Has patient had a PCN reaction occurring within  the last 10 years:Unknown If all of the above answers are "NO", then may proceed with Cephalosporin use.   Avelox [moxifloxacin Hcl In Nacl] Nausea Only   Azithromycin Other (See Comments)   Benzonatate Nausea And Vomiting   Codeine Other (See Comments)   Demerol [meperidine] Other (See Comments)   Hallucination, pass out   Duloxetine Other (See Comments)   CNS effects   Erythromycin  Base Other (See Comments)   Exenatide Other (See Comments)   Reflux   Keflex [cephalexin] Nausea Only   Levofloxacin Nausea Only   Lisinopril Cough   Metformin Diarrhea   Nifedipine Other (See Comments)   Cardiac Awareness   Nitrofurantoin Macrocrystal Other (See Comments)   Not listed   Paroxetine Hcl Other (See Comments)   constipation   Rosiglitazone Nausea Only   Sitagliptin Nausea Only, Other (See Comments)   Loose stools   Spironolactone Nausea Only   "lips pulling"   Sulfamethoxazole Other (See Comments)   Not listed        Medication List     STOP taking these medications    cloNIDine 0.2 mg/24hr patch Commonly known as: CATAPRES - Dosed in mg/24 hr   doxazosin 8 MG tablet Commonly known as: CARDURA   Farxiga 10 MG Tabs tablet Generic drug: dapagliflozin propanediol       TAKE these medications    albuterol 108 (90 Base) MCG/ACT inhaler Commonly known as: VENTOLIN HFA Inhale 2 puffs into the lungs every 6 (six) hours as needed for shortness of breath.   ALPRAZolam 0.25 MG tablet Commonly known as: XANAX Take 0.25-0.5 mg by mouth 3 (three) times daily as needed for anxiety.   amLODipine 5 MG tablet Commonly known as: NORVASC Take 5 mg by mouth at bedtime.   aspirin EC 81 MG tablet Take 81 mg by mouth daily.   atorvastatin 40 MG tablet Commonly known as: LIPITOR Take 40 mg by mouth at bedtime.   candesartan 32 MG tablet Commonly known as: ATACAND Take 32 mg by mouth at bedtime.   cefdinir 300 MG capsule Commonly known as: OMNICEF Take 1 capsule (300 mg total) by mouth 2 (two) times daily for 4 days.   doxycycline 100 MG tablet Commonly known as: VIBRA-TABS Take 1 tablet (100 mg total) by mouth every 12 (twelve) hours for 4 days.   fluticasone 50 MCG/ACT nasal spray Commonly known as: FLONASE Place 2 sprays into both nostrils daily as needed for allergies.   gabapentin 300 MG capsule Commonly known as: NEURONTIN Take 300 mg by mouth  at bedtime.   Hycodan 5-1.5 MG/5ML syrup Generic drug: HYDROcodone bit-homatropine Take 5 mLs by mouth at bedtime as needed for cough.   hydrochlorothiazide 25 MG tablet Commonly known as: HYDRODIURIL Take 50 mg by mouth daily.   insulin glargine 100 UNIT/ML injection Commonly known as: LANTUS Inject 10 Units into the skin at bedtime.   insulin lispro 100 UNIT/ML KwikPen Commonly known as: HUMALOG Inject 15 Units into the skin in the morning, at noon, and at bedtime. Before meals   levothyroxine 200 MCG tablet Commonly known as: SYNTHROID Take 200 mcg by mouth daily before breakfast.   lidocaine 5 % Commonly known as: Lidoderm Place 1 patch onto the skin daily. Remove & Discard patch within 12 hours or as directed by MD   methocarbamol 500 MG tablet Commonly known as: ROBAXIN Take 500 mg by mouth at bedtime as needed for muscle spasms.   metoprolol succinate 50 MG 24 hr tablet  Commonly known as: TOPROL-XL Take 1 tablet by mouth daily.   Miebo 1.338 GM/ML Soln Generic drug: Perfluorohexyloctane Place 1 drop into both eyes in the morning, at noon, in the evening, and at bedtime.   Mounjaro 10 MG/0.5ML Pen Generic drug: tirzepatide Inject 10 mg into the skin once a week.   ondansetron 4 MG tablet Commonly known as: ZOFRAN Take 4 mg by mouth every 8 (eight) hours as needed for nausea.   pantoprazole 40 MG tablet Commonly known as: PROTONIX Take 40 mg by mouth daily.   traMADol 50 MG tablet Commonly known as: ULTRAM Take 50 mg by mouth daily as needed for moderate pain or severe pain.   Vitamin D (Ergocalciferol) 1.25 MG (50000 UNIT) Caps capsule Commonly known as: DRISDOL Take 50,000 Units by mouth every 7 (seven) days. Thursdays               Durable Medical Equipment  (From admission, onward)           Start     Ordered   06/11/22 1538  For home use only DME Other see comment  Once       Comments: non-humidified CPAP machine  Question:  Length  of Need  Answer:  Lifetime   06/11/22 1537   06/09/22 0937  For home use only DME oxygen  Once       Question Answer Comment  Length of Need 6 Months   Mode or (Route) Nasal cannula   Liters per Minute 2   Frequency Continuous (stationary and portable oxygen unit needed)   Oxygen delivery system Gas      06/09/22 0937            Discharge Exam: Filed Weights   06/06/22 1854 06/06/22 2048 06/11/22 1000  Weight: (!) 139.2 kg (!) 139.3 kg (!) 139.4 kg   General NAD  Condition at discharge: stable  The results of significant diagnostics from this hospitalization (including imaging, microbiology, ancillary and laboratory) are listed below for reference.   Imaging Studies: DG Chest 2 View  Result Date: 06/10/2022 CLINICAL DATA:  Cough, flu symptoms EXAM: CHEST - 2 VIEW COMPARISON:  Previous studies including radiograph done on 02/08/2022, CT neck on 06/06/2022 FINDINGS: Transverse diameter of heart is increased. Central pulmonary vessels are prominent. Linear densities are seen in lower lung fields. There is blunting of both posterior costophrenic angles. There is no pneumothorax. IMPRESSION: Central pulmonary vessels are prominent suggesting CHF. Increased markings are seen in lower lung fields, more so on the left side suggesting atelectasis or pneumonia or pulmonary edema. Small bilateral pleural effusions. Electronically Signed   By: Ernie Avena M.D.   On: 06/10/2022 17:19   CT CHEST ABDOMEN PELVIS WO CONTRAST  Result Date: 06/06/2022 CLINICAL DATA:  Sepsis. EXAM: CT CHEST, ABDOMEN AND PELVIS WITHOUT CONTRAST TECHNIQUE: Multidetector CT imaging of the chest, abdomen and pelvis was performed following the standard protocol without IV contrast. RADIATION DOSE REDUCTION: This exam was performed according to the departmental dose-optimization program which includes automated exposure control, adjustment of the mA and/or kV according to patient size and/or use of iterative  reconstruction technique. COMPARISON:  January 20, 2016 FINDINGS: CT CHEST FINDINGS Cardiovascular: Mildly enlarged cardiac silhouette. Calcific atherosclerotic disease of the coronary arteries and aorta. Mediastinum/Nodes: No enlarged mediastinal, hilar, or axillary lymph nodes. Thyroid gland, trachea, and esophagus demonstrate no significant findings. Lungs/Pleura: Focal airspace consolidation versus ground-glass pulmonary mass measures 4.5 x 2.9 cm in the central right upper lobe. Mild  ground-glass opacities in the left upper lobe centrally. Musculoskeletal: No chest wall mass or suspicious bone lesions identified. CT ABDOMEN PELVIS FINDINGS Hepatobiliary: No focal liver abnormality is seen. Status post cholecystectomy. No biliary dilatation. Pancreas: Unremarkable. No pancreatic ductal dilatation or surrounding inflammatory changes. Spleen: Normal in size without focal abnormality. Adrenals/Urinary Tract: Partially calcified right renal mass post cryoablation measures 2.6 x 2.3 cm. Left renal cyst. No hydronephrosis. Normal urinary bladder. Stomach/Bowel: Stomach is within normal limits. No evidence of appendicitis. No evidence of bowel wall thickening, distention, or inflammatory changes. Vascular/Lymphatic: Aortic atherosclerosis. No enlarged abdominal or pelvic lymph nodes. Reproductive: Status post hysterectomy. No adnexal masses. Other: No abdominal wall hernia or abnormality. No abdominopelvic ascites. Musculoskeletal: Spondylosis of the lumbosacral spine. IMPRESSION: 1. Focal airspace consolidation versus ground-glass pulmonary mass measures 4.5 x 2.9 cm in the central right upper lobe. Mild ground-glass opacities in the left upper lobe centrally. These findings may represent multifocal pneumonia. Follow-up to resolution is recommended to exclude underlying neoplasm. 2. Partially calcified right renal mass post cryoablation measures 2.6 x 2.3 cm. 3. Aortic atherosclerosis. Aortic Atherosclerosis  (ICD10-I70.0). Electronically Signed   By: Ted Mcalpine M.D.   On: 06/06/2022 17:44    Microbiology: Results for orders placed or performed during the hospital encounter of 06/06/22  SARS Coronavirus 2 by RT PCR (hospital order, performed in [facility] hospital lab) *cepheid single result test* Anterior Nasal Swab     Status: None   Collection Time: 06/06/22  2:37 PM   Specimen: Anterior Nasal Swab  Result Value Ref Range Status   SARS Coronavirus 2 by RT PCR NEGATIVE NEGATIVE Final    Comment: (NOTE) SARS-CoV-2 target nucleic acids are NOT DETECTED.  The SARS-CoV-2 RNA is generally detectable in upper and lower respiratory specimens during the acute phase of infection. The lowest concentration of SARS-CoV-2 viral copies this assay can detect is 250 copies / mL. A negative result does not preclude SARS-CoV-2 infection and should not be used as the sole basis for treatment or other patient management decisions.  A negative result may occur with improper specimen collection / handling, submission of specimen other than nasopharyngeal swab, presence of viral mutation(s) within the areas targeted by this assay, and inadequate number of viral copies (<250 copies / mL). A negative result must be combined with clinical observations, patient history, and epidemiological information.  Fact Sheet for Patients:   RoadLapTop.co.za  Fact Sheet for Healthcare Providers: http://kim-miller.com/  This test is not yet approved or  cleared by the [place] FDA and has been authorized for detection and/or diagnosis of SARS-CoV-2 by FDA under an Emergency Use Authorization (EUA).  This EUA will remain in effect (meaning this test can be used) for the duration of the COVID-19 declaration under Section 564(b)(1) of the Act, 21 U.S.C. section 360bbb-3(b)(1), unless the authorization is terminated or revoked sooner.  Performed at [facility], [address], [place], [place] [address]   Culture, blood (routine x 2)     Status: None   Collection Time: 06/06/22  2:51 PM   Specimen: BLOOD  Result Value Ref Range Status   Specimen Description BLOOD LEFT ANTECUBITAL  Final   Special Requests   Final    BOTTLES DRAWN AEROBIC AND ANAEROBIC Blood Culture adequate volume   Culture   Final    NO GROWTH 5 DAYS Performed at [facility], [address], [place], [place] [address]    Report Status 06/11/2022 FINAL  Final  Urine Culture     Status: None   Collection Time: 06/06/22  7:12 PM   Specimen: Urine, Clean Catch  Result Value Ref Range Status   Specimen Description   Final    URINE, CLEAN CATCH Performed at [facility], [address], [place], [place] [address]    Special Requests   Final    NONE Performed at [facility], [address], [place], [place] [address]    Culture   Final    NO GROWTH Performed at [facility], [address], [place], [place] [address]    Report Status 06/07/2022 FINAL  Final  MRSA Next Gen by PCR, Nasal     Status: None   Collection Time: 06/06/22 10:27 PM   Specimen: Nasal Mucosa; Nasal Swab  Result Value Ref Range Status   MRSA by PCR Next Gen NOT DETECTED NOT DETECTED Final    Comment: (NOTE) The GeneXpert MRSA Assay (FDA approved for NASAL specimens only), is one component of a comprehensive MRSA colonization surveillance program. It is not intended to diagnose MRSA infection nor to guide or monitor treatment for MRSA infections. Test performance is not FDA approved in patients less than 81 years old. Performed at [facility], [address], [place], [place] [address]     Labs: CBC: Recent Labs  Lab 06/06/22 1450 06/06/22 2329 06/08/22 0417 06/09/22 0432  WBC 8.8 7.4 6.3 5.9  NEUTROABS 5.5  --   --   --   HGB 11.2* 11.2* 11.7* 11.0*  HCT 34.9* 35.5* 37.8 35.6*  MCV 90.9 93.2  94.5 94.4  PLT 150 143* 162 160   Basic Metabolic Panel: Recent Labs  Lab 06/06/22 2329 06/08/22 0417 06/09/22 0432 06/10/22 1317 06/11/22 0812  NA 138 138 140 138 140  K 3.5 3.6 3.7 3.9 3.8  CL 104 104 104 104 98  CO2 26 29 31 30  34*  GLUCOSE 137* 133* 120* 130* 125*  BUN 42* 28* 22 18 18   CREATININE 1.86* 1.41* 1.41* 1.14* 1.19*  CALCIUM 8.4* 8.6* 9.0 8.6* 9.2  MG  --  1.8 1.7 1.7  --    Liver Function Tests: Recent Labs  Lab 06/06/22 1450 06/08/22 0417  AST 11* 14*  ALT 8 12  ALKPHOS 60 61  BILITOT 0.9 0.9  PROT 7.0 7.2  ALBUMIN 3.6 3.0*   CBG: Recent Labs  Lab 06/10/22 1139 06/10/22 1653 06/10/22 2037 06/11/22 0742 06/11/22 1156  GLUCAP 154* 132* 156* 109* 130*    Discharge time spent: greater than 30 minutes.  Signed: Alba Cory, MD [facility] 06/11/2022

## 2022-06-15 ENCOUNTER — Other Ambulatory Visit: Payer: Self-pay | Admitting: Internal Medicine

## 2022-06-15 DIAGNOSIS — J189 Pneumonia, unspecified organism: Secondary | ICD-10-CM

## 2022-07-06 ENCOUNTER — Ambulatory Visit
Admission: RE | Admit: 2022-07-06 | Discharge: 2022-07-06 | Disposition: A | Discharge status: Home / Self Care | Payer: Medicare Other | Source: Ambulatory Visit | Attending: Internal Medicine | Admitting: Internal Medicine

## 2022-07-06 DIAGNOSIS — J189 Pneumonia, unspecified organism: Secondary | ICD-10-CM

## 2022-07-06 MED ORDER — IOPAMIDOL (ISOVUE-300) INJECTION 61%
75.0000 mL | Freq: Once | INTRAVENOUS | Status: AC | PRN
  Administered 2022-07-06: 75 mL via INTRAVENOUS

## 2022-07-21 ENCOUNTER — Ambulatory Visit: Payer: Medicare Other | Admitting: Podiatry

## 2022-08-16 ENCOUNTER — Ambulatory Visit (INDEPENDENT_AMBULATORY_CARE_PROVIDER_SITE_OTHER): Payer: Medicare Other | Admitting: Podiatry

## 2022-08-16 ENCOUNTER — Encounter: Payer: Self-pay | Admitting: Podiatry

## 2022-08-16 DIAGNOSIS — M79675 Pain in left toe(s): Secondary | ICD-10-CM | POA: Diagnosis not present

## 2022-08-16 DIAGNOSIS — B351 Tinea unguium: Secondary | ICD-10-CM

## 2022-08-16 DIAGNOSIS — E119 Type 2 diabetes mellitus without complications: Secondary | ICD-10-CM

## 2022-08-16 DIAGNOSIS — M79674 Pain in right toe(s): Secondary | ICD-10-CM

## 2022-08-16 NOTE — Progress Notes (Signed)
This patient returns to my office for at risk foot care.  This patient requires this care by a professional since this patient will be at risk due to having diabetes and chronic kidney disease.  This patient is unable to cut nails herself since the patient cannot reach her nails .These nails are painful walking and wearing shoes.  This patient presents for at risk foot care today.  General Appearance  Alert, conversant and in no acute stress.  Vascular  Dorsalis pedis and posterior tibial  pulses are palpable  bilaterally.  Capillary return is within normal limits  bilaterally. Temperature is within normal limits  bilaterally.  Neurologic  Senn-Weinstein monofilament wire test within normal limits  bilaterally. Muscle power within normal limits bilaterally.  Nails Thick disfigured discolored nails with subungual debris  from hallux to fifth toes bilaterally. Pincer nails . No evidence of bacterial infection or drainage bilaterally.  Orthopedic  No limitations of motion  feet .  No crepitus or effusions noted.  No bony pathology or digital deformities noted.  Skin  normotropic skin with no porokeratosis noted bilaterally.  No signs of infections or ulcers noted.     Onychomycosis  Pain in right toes  Pain in left toes  Consent was obtained for treatment procedures.   Mechanical debridement of nails 1-5  bilaterally performed with a nail nipper.  Filed with dremel without incident.    Return office visit   10 weeks                 Told patient to return for periodic foot care and evaluation due to potential at risk complications.     DPM  
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
## 2022-09-09 ENCOUNTER — Ambulatory Visit: Payer: Medicare Other | Attending: Interventional Cardiology | Admitting: Interventional Cardiology

## 2022-09-09 ENCOUNTER — Encounter: Payer: Self-pay | Admitting: Interventional Cardiology

## 2022-09-09 VITALS — BP 122/62 | HR 62 | Ht 68.0 in | Wt 301.4 lb

## 2022-09-09 DIAGNOSIS — R600 Localized edema: Secondary | ICD-10-CM | POA: Diagnosis not present

## 2022-09-09 DIAGNOSIS — R0609 Other forms of dyspnea: Secondary | ICD-10-CM

## 2022-09-09 DIAGNOSIS — I350 Nonrheumatic aortic (valve) stenosis: Secondary | ICD-10-CM

## 2022-09-09 DIAGNOSIS — E782 Mixed hyperlipidemia: Secondary | ICD-10-CM

## 2022-09-09 DIAGNOSIS — I10 Essential (primary) hypertension: Secondary | ICD-10-CM | POA: Diagnosis not present

## 2022-09-09 DIAGNOSIS — I872 Venous insufficiency (chronic) (peripheral): Secondary | ICD-10-CM

## 2022-09-09 NOTE — Patient Instructions (Signed)
Medication Instructions:   Your physician recommends that you continue on your current medications as directed. Please refer to the Current Medication list given to you today.  *If you need a refill on your cardiac medications before your next appointment, please call your pharmacy*  Lab Work:  None ordered today  Testing/Procedures:  None ordered today  Follow-Up: At [facility], you and your health needs are our priority.  As part of our continuing mission to provide you with exceptional heart care, we have created designated Provider Care Teams.  These Care Teams include your primary Cardiologist (physician) and Advanced Practice Providers (APPs -  Physician Assistants and Nurse Practitioners) who all work together to provide you with the care you need, when you need it.  We recommend signing up for the patient portal called "MyChart".  Sign up information is provided on this After Visit Summary.  MyChart is used to connect with patients for Virtual Visits (Telemedicine).  Patients are able to view lab/test results, encounter notes, upcoming appointments, etc.  Non-urgent messages can be sent to your provider as well.   To learn more about what you can do with MyChart, go to [email or web address].    Your next appointment:   12 month(s)  The format for your next appointment:   In Person  Provider:   Candee Furbish, MD

## 2022-11-22 ENCOUNTER — Ambulatory Visit: Payer: Medicare Other | Admitting: Podiatry

## 2022-11-24 ENCOUNTER — Encounter: Payer: Self-pay | Admitting: Podiatry

## 2022-11-24 ENCOUNTER — Ambulatory Visit (INDEPENDENT_AMBULATORY_CARE_PROVIDER_SITE_OTHER): Payer: Medicare Other | Admitting: Podiatry

## 2022-11-24 DIAGNOSIS — M79675 Pain in left toe(s): Secondary | ICD-10-CM

## 2022-11-24 DIAGNOSIS — M79674 Pain in right toe(s): Secondary | ICD-10-CM

## 2022-11-24 DIAGNOSIS — B351 Tinea unguium: Secondary | ICD-10-CM | POA: Diagnosis not present

## 2022-11-24 DIAGNOSIS — E119 Type 2 diabetes mellitus without complications: Secondary | ICD-10-CM | POA: Diagnosis not present

## 2022-11-24 NOTE — Progress Notes (Signed)
This patient returns to my office for at risk foot care.  This patient requires this care by a professional since this patient will be at risk due to having diabetes and chronic kidney disease.  This patient is unable to cut nails herself since the patient cannot reach her nails .These nails are painful walking and wearing shoes.  This patient presents for at risk foot care today.  General Appearance  Alert, conversant and in no acute stress.  Vascular  Dorsalis pedis and posterior tibial  pulses are palpable  bilaterally.  Capillary return is within normal limits  bilaterally. Temperature is within normal limits  bilaterally.  Neurologic  Senn-Weinstein monofilament wire test within normal limits  bilaterally. Muscle power within normal limits bilaterally.  Nails Thick disfigured discolored nails with subungual debris  from hallux to fifth toes bilaterally. Pincer nails . No evidence of bacterial infection or drainage bilaterally.  Orthopedic  No limitations of motion  feet .  No crepitus or effusions noted.  No bony pathology or digital deformities noted.  Skin  normotropic skin with no porokeratosis noted bilaterally.  No signs of infections or ulcers noted.     Onychomycosis  Pain in right toes  Pain in left toes  Consent was obtained for treatment procedures.   Mechanical debridement of nails 1-5  bilaterally performed with a nail nipper.  Filed with dremel without incident.    Return office visit   10 weeks                 Told patient to return for periodic foot care and evaluation due to potential at risk complications.   Josafat Enrico DPM  

## 2023-03-03 ENCOUNTER — Ambulatory Visit: Payer: Medicare Other | Admitting: Podiatry

## 2023-03-16 ENCOUNTER — Ambulatory Visit (INDEPENDENT_AMBULATORY_CARE_PROVIDER_SITE_OTHER): Payer: Medicare Other | Admitting: Podiatry

## 2023-03-16 ENCOUNTER — Encounter: Payer: Self-pay | Admitting: Podiatry

## 2023-03-16 DIAGNOSIS — B351 Tinea unguium: Secondary | ICD-10-CM

## 2023-03-16 DIAGNOSIS — E119 Type 2 diabetes mellitus without complications: Secondary | ICD-10-CM | POA: Diagnosis not present

## 2023-03-16 DIAGNOSIS — M79675 Pain in left toe(s): Secondary | ICD-10-CM | POA: Diagnosis not present

## 2023-03-16 DIAGNOSIS — N183 Chronic kidney disease, stage 3 unspecified: Secondary | ICD-10-CM | POA: Diagnosis not present

## 2023-03-16 DIAGNOSIS — M79674 Pain in right toe(s): Secondary | ICD-10-CM | POA: Diagnosis not present

## 2023-03-16 NOTE — Progress Notes (Signed)
This patient returns to my office for at risk foot care.  This patient requires this care by a professional since this patient will be at risk due to having diabetes and chronic kidney disease.  This patient is unable to cut nails herself since the patient cannot reach her nails .These nails are painful walking and wearing shoes.  This patient presents for at risk foot care today.  General Appearance  Alert, conversant and in no acute stress.  Vascular  Dorsalis pedis and posterior tibial  pulses are palpable  bilaterally.  Capillary return is within normal limits  bilaterally. Temperature is within normal limits  bilaterally.  Neurologic  Senn-Weinstein monofilament wire test within normal limits  bilaterally. Muscle power within normal limits bilaterally.  Nails Thick disfigured discolored nails with subungual debris  from hallux to fifth toes bilaterally. Pincer nails . No evidence of bacterial infection or drainage bilaterally.  Orthopedic  No limitations of motion  feet .  No crepitus or effusions noted.  No bony pathology or digital deformities noted.  Skin  normotropic skin with no porokeratosis noted bilaterally.  No signs of infections or ulcers noted.     Onychomycosis  Pain in right toes  Pain in left toes  Consent was obtained for treatment procedures.   Mechanical debridement of nails 1-5  bilaterally performed with a nail nipper.  Filed with dremel without incident.    Return office visit   10 weeks                 Told patient to return for periodic foot care and evaluation due to potential at risk complications.   Josafat Enrico DPM  

## 2023-06-16 ENCOUNTER — Ambulatory Visit (INDEPENDENT_AMBULATORY_CARE_PROVIDER_SITE_OTHER): Admitting: Podiatry

## 2023-06-16 ENCOUNTER — Encounter: Payer: Self-pay | Admitting: Podiatry

## 2023-06-16 DIAGNOSIS — B351 Tinea unguium: Secondary | ICD-10-CM

## 2023-06-16 DIAGNOSIS — M79675 Pain in left toe(s): Secondary | ICD-10-CM | POA: Diagnosis not present

## 2023-06-16 DIAGNOSIS — M79674 Pain in right toe(s): Secondary | ICD-10-CM | POA: Diagnosis not present

## 2023-06-16 DIAGNOSIS — E119 Type 2 diabetes mellitus without complications: Secondary | ICD-10-CM

## 2023-06-16 NOTE — Progress Notes (Signed)
This patient returns to my office for at risk foot care.  This patient requires this care by a professional since this patient will be at risk due to having diabetes and chronic kidney disease.  This patient is unable to cut nails herself since the patient cannot reach her nails .These nails are painful walking and wearing shoes.  This patient presents for at risk foot care today.  General Appearance  Alert, conversant and in no acute stress.  Vascular  Dorsalis pedis and posterior tibial  pulses are palpable  bilaterally.  Capillary return is within normal limits  bilaterally. Temperature is within normal limits  bilaterally.  Neurologic  Senn-Weinstein monofilament wire test within normal limits  bilaterally. Muscle power within normal limits bilaterally.  Nails Thick disfigured discolored nails with subungual debris  from hallux to fifth toes bilaterally. Pincer nails . No evidence of bacterial infection or drainage bilaterally.  Orthopedic  No limitations of motion  feet .  No crepitus or effusions noted.  No bony pathology or digital deformities noted.  Skin  normotropic skin with no porokeratosis noted bilaterally.  No signs of infections or ulcers noted.     Onychomycosis  Pain in right toes  Pain in left toes  Consent was obtained for treatment procedures.   Mechanical debridement of nails 1-5  bilaterally performed with a nail nipper.  Filed with dremel without incident.    Return office visit   10 weeks                 Told patient to return for periodic foot care and evaluation due to potential at risk complications.   Josafat Enrico DPM  

## 2023-09-15 ENCOUNTER — Ambulatory Visit: Admitting: Podiatry

## 2023-09-23 ENCOUNTER — Ambulatory Visit: Attending: Cardiology | Admitting: Cardiology

## 2023-09-23 ENCOUNTER — Encounter: Payer: Self-pay | Admitting: Cardiology

## 2023-09-23 VITALS — BP 140/58 | HR 57 | Ht 68.0 in | Wt 300.0 lb

## 2023-09-23 DIAGNOSIS — I872 Venous insufficiency (chronic) (peripheral): Secondary | ICD-10-CM

## 2023-09-23 DIAGNOSIS — I10 Essential (primary) hypertension: Secondary | ICD-10-CM

## 2023-09-23 NOTE — Progress Notes (Signed)
 Cardiology Office Note:  .   Date:  09/23/2023  ID:  [name], DOB [birth year], MRN [id number] PCP: Ransom Other, MD  [facility] Providers Cardiologist:  Oneil Parchment, MD     History of Present Illness: .   [name] is a 78 y.o. female Discussed the use of AI scribe software for clinical note transcription with the patient, who gave verbal consent to proceed.  History of Present Illness [name] is a 78 year old female with morbid obesity and mild aortic stenosis who presents with shortness of breath. She was referred by Dr. Dann for cardiovascular evaluation.  She experiences shortness of breath, which is multifactorial, including deconditioning and obesity. Her current BMI is 45, and she is actively working on weight loss. She wants to avoid diuretics.  She has a history of morbid obesity and is currently on Ozempic for weight management. She previously lost 40 pounds on Mounjaro  but experienced worsening acid reflux, leading to discontinuation. Ozempic exacerbates her heartburn, and she is currently taking it every other week.  She has diabetes with a hemoglobin A1c of 6.9% and was referred to a PharmD in the past for possible weight loss drugs.  She experiences significant acid reflux, sometimes feeling like she is having a heart attack due to the symptoms. She has been on omeprazole but discontinued it due to concerns about rare kidney disorders. She is currently taking pantoprazole  40 mg once daily and occasionally uses her husband's omeprazole 20 mg. Her acid reflux worsened with Mounjaro  and Ozempic.  She has a history of anxiety and depression with a 'racing mind' and is under the care of a psychologist and a psychiatrist. She recently met a psychiatrist who is a Publishing rights manager but cannot prescribe medication. She has been prescribed Xanax , which she has adjusted to a lower dose on her own.  She reports heaviness in her chest sometimes, which she  associates with burping and heartburn. She also describes coughing up thick, frothy, egg yolk-like sputum.     Studies Reviewed: SABRA   EKG Interpretation Date/Time:  Friday September 23 2023 14:33:55 EDT Ventricular Rate:  57 PR Interval:  202 QRS Duration:  140 QT Interval:  462 QTC Calculation: 449 R Axis:   -66  Text Interpretation: Sinus bradycardia Left axis deviation Non-specific intra-ventricular conduction block Minimal voltage criteria for LVH, may be normal variant ( Cornell product ) When compared with ECG of 08-Jun-2022 18:40, Premature atrial complexes are no longer Present Confirmed by Parchment Oneil ([id number]) on 09/23/2023 2:49:28 PM    Results LABS Hemoglobin A1c: 6.9% LDL: 56 mg/dL  DIAGNOSTIC EKG: Normal (09/23/2023) Risk Assessment/Calculations:           Physical Exam:   VS:  BP (!) 140/58   Pulse (!) 57   Ht 5' 8 (1.727 m)   Wt 300 lb (136.1 kg)   SpO2 92%   BMI 45.61 kg/m    Wt Readings from Last 3 Encounters:  09/23/23 300 lb (136.1 kg)  09/09/22 (!) 301 lb 6.4 oz (136.7 kg)  06/11/22 (!) 307 lb 6.4 oz (139.4 kg)    GEN: Well nourished, well developed in no acute distress NECK: No JVD; No carotid bruits CARDIAC: RRR, no murmurs, no rubs, no gallops RESPIRATORY:  Clear to auscultation without rales, wheezing or rhonchi  ABDOMEN: Soft, non-tender, non-distended EXTREMITIES:  No edema; No deformity   ASSESSMENT AND PLAN: .    Assessment and Plan Assessment & Plan Shortness of  breath, multifactorial Shortness of breath attributed to deconditioning and obesity. No acute cardiac issues identified. - No need for diuretics at this time.  Mild aortic stenosis Mild aortic stenosis identified in 2024. No symptoms of severe aortic stenosis reported.  Morbid obesity Morbid obesity with a BMI of 45. Actively working on weight loss. Previous use of Mounjaro  resulted in significant weight loss but exacerbated GERD symptoms.  Type 2 diabetes  mellitus Type 2 diabetes mellitus with a hemoglobin A1c of 6.9%. - Has been on GLP-1  Gastroesophageal reflux disease (GERD) GERD symptoms exacerbated by weight loss medications. Previous adverse effects with omeprazole and famotidine . - Continue pantoprazole  40 mg once daily.  Hyperlipidemia Hyperlipidemia with LDL at 56 mg/dL.  Mild venous insufficiency Mild venous insufficiency.  Prop legs when possible  Depression and anxiety Depression and anxiety with racing thoughts. Recently started seeing a psychiatrist.  - Current management includes Xanax , with self-adjusted decreased dosage.         Signed, Oneil Parchment, MD

## 2023-09-23 NOTE — Patient Instructions (Signed)
 Medication Instructions:  The current medical regimen is effective;  continue present plan and medications.  *If you need a refill on your cardiac medications before your next appointment, please call your pharmacy*  Follow-Up: At [facility], you and your health needs are our priority.  As part of our continuing mission to provide you with exceptional heart care, our providers are all part of one team.  This team includes your primary Cardiologist (physician) and Advanced Practice Providers or APPs (Physician Assistants and Nurse Practitioners) who all work together to provide you with the care you need, when you need it.  Your next appointment:   1 year(s)  Provider:   Dr Dorothye Gathers     We recommend signing up for the patient portal called "MyChart".  Sign up information is provided on this After Visit Summary.  MyChart is used to connect with patients for Virtual Visits (Telemedicine).  Patients are able to view lab/test results, encounter notes, upcoming appointments, etc.  Non-urgent messages can be sent to your provider as well.   To learn more about what you can do with MyChart, go to [email or web address].

## 2023-09-28 ENCOUNTER — Ambulatory Visit: Admitting: Podiatry

## 2023-10-06 ENCOUNTER — Encounter: Payer: Self-pay | Admitting: Podiatry

## 2023-10-06 ENCOUNTER — Ambulatory Visit (INDEPENDENT_AMBULATORY_CARE_PROVIDER_SITE_OTHER): Admitting: Podiatry

## 2023-10-06 DIAGNOSIS — M79675 Pain in left toe(s): Secondary | ICD-10-CM | POA: Diagnosis not present

## 2023-10-06 DIAGNOSIS — B351 Tinea unguium: Secondary | ICD-10-CM

## 2023-10-06 DIAGNOSIS — N183 Chronic kidney disease, stage 3 unspecified: Secondary | ICD-10-CM

## 2023-10-06 DIAGNOSIS — E119 Type 2 diabetes mellitus without complications: Secondary | ICD-10-CM | POA: Diagnosis not present

## 2023-10-06 DIAGNOSIS — M79674 Pain in right toe(s): Secondary | ICD-10-CM | POA: Diagnosis not present

## 2023-10-06 NOTE — Progress Notes (Signed)
 This patient returns to my office for at risk foot care.  This patient requires this care by a professional since this patient will be at risk due to having diabetes and chronic kidney disease.  This patient is unable to cut nails herself since the patient cannot reach her nails .These nails are painful walking and wearing shoes.  This patient presents for at risk foot care today.  General Appearance  Alert, conversant and in no acute stress.  Vascular  Dorsalis pedis and posterior tibial  pulses are palpable  bilaterally.  Capillary return is within normal limits  bilaterally. Temperature is within normal limits  bilaterally.  Neurologic  Senn-Weinstein monofilament wire test within normal limits  bilaterally. Muscle power within normal limits bilaterally.  Nails Thick disfigured discolored nails with subungual debris  from hallux to fifth toes bilaterally. Pincer nails . No evidence of bacterial infection or drainage bilaterally.  Orthopedic  No limitations of motion  feet .  No crepitus or effusions noted.  No bony pathology or digital deformities noted.  Skin  normotropic skin with no porokeratosis noted bilaterally.  No signs of infections or ulcers noted.     Onychomycosis  Pain in right toes  Pain in left toes  Consent was obtained for treatment procedures.   Mechanical debridement of nails 1-5  bilaterally performed with a nail nipper.  Filed with dremel without incident.    Return office visit   12 weeks                 Told patient to return for periodic foot care and evaluation due to potential at risk complications.   Cordella Bold DPM

## 2024-01-19 ENCOUNTER — Encounter: Payer: Self-pay | Admitting: Podiatry

## 2024-01-19 ENCOUNTER — Ambulatory Visit: Admitting: Podiatry

## 2024-01-19 DIAGNOSIS — M79675 Pain in left toe(s): Secondary | ICD-10-CM

## 2024-01-19 DIAGNOSIS — M79674 Pain in right toe(s): Secondary | ICD-10-CM

## 2024-01-19 DIAGNOSIS — N183 Chronic kidney disease, stage 3 unspecified: Secondary | ICD-10-CM

## 2024-01-19 DIAGNOSIS — E119 Type 2 diabetes mellitus without complications: Secondary | ICD-10-CM

## 2024-01-19 DIAGNOSIS — B351 Tinea unguium: Secondary | ICD-10-CM | POA: Diagnosis not present

## 2024-01-19 NOTE — Progress Notes (Signed)
 This patient returns to my office for at risk foot care.  This patient requires this care by a professional since this patient will be at risk due to having diabetes and chronic kidney disease.  This patient is unable to cut nails herself since the patient cannot reach her nails .These nails are painful walking and wearing shoes.  This patient presents for at risk foot care today.  General Appearance  Alert, conversant and in no acute stress.  Vascular  Dorsalis pedis and posterior tibial  pulses are palpable  bilaterally.  Capillary return is within normal limits  bilaterally. Temperature is within normal limits  bilaterally.  Neurologic  Senn-Weinstein monofilament wire test within normal limits  bilaterally. Muscle power within normal limits bilaterally.  Nails Thick disfigured discolored nails with subungual debris  from hallux to fifth toes bilaterally. Pincer nails . No evidence of bacterial infection or drainage bilaterally.  Orthopedic  No limitations of motion  feet .  No crepitus or effusions noted.  No bony pathology or digital deformities noted.  Skin  normotropic skin with no porokeratosis noted bilaterally.  No signs of infections or ulcers noted.     Onychomycosis  Pain in right toes  Pain in left toes  Consent was obtained for treatment procedures.   Mechanical debridement of nails 1-5  bilaterally performed with a nail nipper.  Filed with dremel without incident.    Return office visit   16 weeks                 Told patient to return for periodic foot care and evaluation due to potential at risk complications.   Cordella Bold DPM tbn

## 2024-01-30 ENCOUNTER — Other Ambulatory Visit: Payer: Self-pay

## 2024-01-30 ENCOUNTER — Encounter (HOSPITAL_BASED_OUTPATIENT_CLINIC_OR_DEPARTMENT_OTHER): Payer: Self-pay | Admitting: Radiology

## 2024-01-30 ENCOUNTER — Emergency Department (HOSPITAL_BASED_OUTPATIENT_CLINIC_OR_DEPARTMENT_OTHER)
Admission: EM | Admit: 2024-01-30 | Discharge: 2024-01-31 | Disposition: A | Discharge status: Home / Self Care | Attending: Emergency Medicine | Admitting: Emergency Medicine

## 2024-01-30 DIAGNOSIS — Z794 Long term (current) use of insulin: Secondary | ICD-10-CM | POA: Diagnosis not present

## 2024-01-30 DIAGNOSIS — Z7982 Long term (current) use of aspirin: Secondary | ICD-10-CM | POA: Diagnosis not present

## 2024-01-30 DIAGNOSIS — R079 Chest pain, unspecified: Secondary | ICD-10-CM | POA: Insufficient documentation

## 2024-01-30 DIAGNOSIS — R7989 Other specified abnormal findings of blood chemistry: Secondary | ICD-10-CM | POA: Diagnosis not present

## 2024-01-30 DIAGNOSIS — Z79899 Other long term (current) drug therapy: Secondary | ICD-10-CM | POA: Diagnosis not present

## 2024-01-30 LAB — URINALYSIS, ROUTINE W REFLEX MICROSCOPIC
Bilirubin Urine: NEGATIVE
Glucose, UA: >1000 mg/dL — AB
Hgb urine dipstick: NEGATIVE
Leukocytes,Ua: NEGATIVE
Nitrite: NEGATIVE
Specific Gravity, Urine: 1.026 (ref 1.005–1.030)
pH: 5.5 (ref 5.0–8.0)

## 2024-01-30 LAB — CBC
HCT: 40.8 % (ref 36.0–46.0)
Hemoglobin: 13 g/dL (ref 12.0–15.0)
MCH: 29.3 pg (ref 26.0–34.0)
MCHC: 31.9 g/dL (ref 30.0–36.0)
MCV: 91.9 fL (ref 80.0–100.0)
Platelets: 153 K/uL (ref 150–400)
RBC: 4.44 MIL/uL (ref 3.87–5.11)
RDW: 15 % (ref 11.5–15.5)
WBC: 4.5 K/uL (ref 4.0–10.5)
nRBC: 0 % (ref 0.0–0.2)

## 2024-01-30 LAB — TROPONIN T, HIGH SENSITIVITY: Troponin T High Sensitivity: 36 ng/L — ABNORMAL HIGH (ref 0–19)

## 2024-01-30 LAB — COMPREHENSIVE METABOLIC PANEL WITH GFR
ALT: 9 U/L (ref 0–44)
AST: 17 U/L (ref 15–41)
Albumin: 4.1 g/dL (ref 3.5–5.0)
Alkaline Phosphatase: 67 U/L (ref 38–126)
Anion gap: 9 (ref 5–15)
BUN: 20 mg/dL (ref 8–23)
CO2: 33 mmol/L — ABNORMAL HIGH (ref 22–32)
Calcium: 10 mg/dL (ref 8.9–10.3)
Chloride: 100 mmol/L (ref 98–111)
Creatinine, Ser: 1.56 mg/dL — ABNORMAL HIGH (ref 0.44–1.00)
GFR, Estimated: 34 mL/min — ABNORMAL LOW
Glucose, Bld: 135 mg/dL — ABNORMAL HIGH (ref 70–99)
Potassium: 4.5 mmol/L (ref 3.5–5.1)
Sodium: 143 mmol/L (ref 135–145)
Total Bilirubin: 1.1 mg/dL (ref 0.0–1.2)
Total Protein: 7.2 g/dL (ref 6.5–8.1)

## 2024-01-30 LAB — LIPASE, BLOOD: Lipase: 49 U/L (ref 11–51)

## 2024-01-30 LAB — CBG MONITORING, ED: Glucose-Capillary: 126 mg/dL — ABNORMAL HIGH (ref 70–99)

## 2024-01-30 MED ORDER — PANTOPRAZOLE SODIUM 40 MG IV SOLR
40.0000 mg | Freq: Once | INTRAVENOUS | Status: AC
  Administered 2024-01-31: 40 mg via INTRAVENOUS
  Filled 2024-01-30: qty 10

## 2024-01-30 MED ORDER — SUCRALFATE 1 G PO TABS: 1.0000 g | ORAL_TABLET | Freq: Three times a day (TID) | ORAL | 0 refills | Status: AC

## 2024-01-30 NOTE — ED Provider Notes (Signed)
 Care assumed from Dr. Randol, patient with multiple complaints, but being evaluated for chest discomfort. Initial troponin is mildly elevated with repeat pending. Chest x-ray is pending. If both are okay, she cn be discharged.  Repeat troponin is unchanged, chest x-ray shows small bilateral pleural effusions and no other acute process.  I have independently viewed all the images, and agree with radiologist's interpretation.  No evidence of ACS.  She is safe for discharge.  Results for orders placed or performed during the hospital encounter of 01/30/24  Lipase, blood   Collection Time: 01/30/24  5:20 PM  Result Value Ref Range   Lipase 49 11 - 51 U/L  Comprehensive metabolic panel   Collection Time: 01/30/24  5:20 PM  Result Value Ref Range   Sodium 143 135 - 145 mmol/L   Potassium 4.5 3.5 - 5.1 mmol/L   Chloride 100 98 - 111 mmol/L   CO2 33 (H) 22 - 32 mmol/L   Glucose, Bld 135 (H) 70 - 99 mg/dL   BUN 20 8 - 23 mg/dL   Creatinine, Ser 8.43 (H) 0.44 - 1.00 mg/dL   Calcium  10.0 8.9 - 10.3 mg/dL   Total Protein 7.2 6.5 - 8.1 g/dL   Albumin 4.1 3.5 - 5.0 g/dL   AST 17 15 - 41 U/L   ALT 9 0 - 44 U/L   Alkaline Phosphatase 67 38 - 126 U/L   Total Bilirubin 1.1 0.0 - 1.2 mg/dL   GFR, Estimated 34 (L) >60 mL/min   Anion gap 9 5 - 15  CBC   Collection Time: 01/30/24  5:20 PM  Result Value Ref Range   WBC 4.5 4.0 - 10.5 K/uL   RBC 4.44 3.87 - 5.11 MIL/uL   Hemoglobin 13.0 12.0 - 15.0 g/dL   HCT 59.1 63.9 - 53.9 %   MCV 91.9 80.0 - 100.0 fL   MCH 29.3 26.0 - 34.0 pg   MCHC 31.9 30.0 - 36.0 g/dL   RDW 84.9 88.4 - 84.4 %   Platelets 153 150 - 400 K/uL   nRBC 0.0 0.0 - 0.2 %  CBG monitoring, ED   Collection Time: 01/30/24  8:06 PM  Result Value Ref Range   Glucose-Capillary 126 (H) 70 - 99 mg/dL  Urinalysis, Routine w reflex microscopic -Urine, Clean Catch   Collection Time: 01/30/24  8:32 PM  Result Value Ref Range   Color, Urine YELLOW YELLOW   APPearance CLEAR CLEAR   Specific  Gravity, Urine 1.026 1.005 - 1.030   pH 5.5 5.0 - 8.0   Glucose, UA >1,000 (A) NEGATIVE mg/dL   Hgb urine dipstick NEGATIVE NEGATIVE   Bilirubin Urine NEGATIVE NEGATIVE   Ketones, ur TRACE (A) NEGATIVE mg/dL   Protein, ur TRACE (A) NEGATIVE mg/dL   Nitrite NEGATIVE NEGATIVE   Leukocytes,Ua NEGATIVE NEGATIVE   RBC / HPF 0-5 0 - 5 RBC/hpf   WBC, UA 0-5 0 - 5 WBC/hpf   Bacteria, UA RARE (A) NONE SEEN   Squamous Epithelial / HPF 6-10 0 - 5 /HPF   Mucus PRESENT    Hyaline Casts, UA PRESENT   Troponin T, High Sensitivity   Collection Time: 01/30/24 10:12 PM  Result Value Ref Range   Troponin T High Sensitivity 36 (H) 0 - 19 ng/L  Troponin T, High Sensitivity   Collection Time: 01/31/24 12:23 AM  Result Value Ref Range   Troponin T High Sensitivity 37 (H) 0 - 19 ng/L   DG Chest 2 View Result Date:  01/31/2024 EXAM: 2 VIEW(S) XRAY OF THE CHEST 01/31/2024 12:23:00 AM COMPARISON: 06/10/2022 CLINICAL HISTORY: chest pain FINDINGS: LUNGS AND PLEURA: Mild left basilar atelectasis/scarring. Low lung volumes. Trace bilateral pleural effusions. No pneumothorax. HEART AND MEDIASTINUM: No acute abnormality of the cardiac and mediastinal silhouettes. BONES AND SOFT TISSUES: Degenerative changes of the thoracic spine. IMPRESSION: 1. Trace bilateral pleural effusions. Electronically signed by: Pinkie Pebbles MD 01/31/2024 12:28 AM EST RP Workstation: [id number]      Raford Lenis, MD 01/31/24 [phone]

## 2024-01-30 NOTE — ED Notes (Signed)
 Was called out to lobby as pt alerted registation that she was having a panic attack. Pt brought to triage room to re-evaluate and comfort.  Pt advised that urine sample is needed

## 2024-01-30 NOTE — ED Triage Notes (Signed)
 Pt feels that she is taking too much klonopin. She is on multiple medication for mental health issues. She endorses that today after she ate her breakfast around 10am she started having trouble swallowing. She states it felt like her acid reflux kicked up. She endorses that she took some of her Rolaids to see if it would help. She endorses that it did help but now she states her stomach is burning. She endorses that they increased her klonopin 2 days ago and she isn't sure if her trouble swallowing is from the increase in medication or from her reflux. She has not taken the medication today which is now causing her to have racing thoughts. She denise SI/HI. Pt is breathing without difficulty in triage and speaking in full sentences. Respiratory has evaluated patient.

## 2024-01-30 NOTE — ED Provider Notes (Signed)
 " [place] [facility] AT [facility] Provider Note   CSN: [id number] Arrival date & time: 01/30/24  1646     Patient presents with: Gastroesophageal Reflux and trouble swallowing   [name] is a 79 y.o. female.    Gastroesophageal Reflux     Pt states she has been taking medications for anxiety and depression.  She has acid reflux and thinks these medications have made it worse.  Today she felt like she was not able to burp.  SHe has tried rolaids without relief. Pt also has been having some heart racing symptoms.  She tried to call her psychiatrist but they did not call back.  She felt like she was having a panic attack.   Prior to Admission medications  Medication Sig Start Date End Date Taking? Authorizing Provider  sucralfate  (CARAFATE ) 1 g tablet Take 1 tablet (1 g total) by mouth in the morning, at noon, and at bedtime. 01/30/24  Yes Randol Simmonds, MD  albuterol  (VENTOLIN  HFA) 108 (90 Base) MCG/ACT inhaler Inhale 2 puffs into the lungs every 6 (six) hours as needed for shortness of breath.    [provider]  ALPRAZolam  (XANAX ) 0.25 MG tablet Take 0.25-0.5 mg by mouth 3 (three) times daily as needed for anxiety.    [provider]  amLODipine  (NORVASC ) 5 MG tablet Take 5 mg by mouth at bedtime.    [provider]  aspirin  EC 81 MG tablet Take 81 mg by mouth daily.    [provider]  atorvastatin  (LIPITOR) 40 MG tablet Take 40 mg by mouth at bedtime.    [provider]  candesartan (ATACAND) 32 MG tablet Take 32 mg by mouth at bedtime.    [provider]  fluticasone (FLONASE) 50 MCG/ACT nasal spray Place 2 sprays into both nostrils daily as needed for allergies.    [provider]  gabapentin (NEURONTIN) 300 MG capsule Take 300 mg by mouth at bedtime. 03/15/19   [provider]  HYCODAN 5-1.5 MG/5ML syrup Take 5 mLs by mouth at bedtime as needed for cough. 06/04/22   [provider]  hydrochlorothiazide  (HYDRODIURIL ) 25 MG tablet Take 50 mg by mouth daily. 12/29/17   [provider]  insulin  glargine (LANTUS ) 100 UNIT/ML injection Inject 10 Units into the skin at bedtime. 06/19/18   [provider]  insulin  lispro (HUMALOG) 100 UNIT/ML KwikPen Inject 15 Units into the skin in the morning, at noon, and at bedtime. Before meals 04/19/18   [provider]  levothyroxine  (SYNTHROID ) 200 MCG tablet Take 200 mcg by mouth daily before breakfast. 02/14/18   [provider]  lidocaine  (LIDODERM ) 5 % Place 1 patch onto the skin daily. Remove & Discard patch within 12 hours or as directed by MD 02/08/22   Rancour, Garnette, MD  methocarbamol (ROBAXIN) 500 MG tablet Take 500 mg by mouth at bedtime as needed for muscle spasms. 05/28/22   [provider]  metoprolol  succinate (TOPROL -XL) 50 MG 24 hr tablet Take 1 tablet by mouth daily. 05/02/19   [provider]  MIEBO 1.338 GM/ML SOLN Place 1 drop into both eyes in the morning, at noon, in the evening, and at bedtime. 05/31/22   [provider]  ondansetron  (ZOFRAN ) 4 MG tablet Take 4 mg by mouth every 8 (eight) hours as needed for nausea.    [provider]  pantoprazole  (PROTONIX ) 40 MG tablet Take 40 mg by mouth daily. 04/22/22   [provider]  tirzepatide  (MOUNJARO ) 10 MG/0.5ML Pen Inject 10 mg into the skin once a week. 05/31/22   Dann Candyce RAMAN, MD  traMADol  (ULTRAM ) 50 MG tablet Take 50 mg by mouth daily as needed for moderate pain or severe pain.    [provider]  Vitamin D , Ergocalciferol , (DRISDOL ) 50000 UNITS CAPS Take 50,000 Units by mouth every 7 (seven) days. Thursdays    [provider]    Allergies: Esomeprazole magnesium , Aliskiren, Augmentin [amoxicillin-pot clavulanate], Avelox  [moxifloxacin  hcl in nacl], Azithromycin , Benzonatate , Codeine, Demerol [meperidine], Duloxetine, Erythromycin base, Exenatide, Keflex   [cephalexin ], Levofloxacin, Lisinopril, Metformin, Nifedipine, Nitrofurantoin macrocrystal, Paroxetine hcl, Rosiglitazone, Sitagliptin, Spironolactone, and Sulfamethoxazole     Review of Systems  Updated Vital Signs BP (!) 172/66   Pulse 62   Temp 98.6 F (37 C)   Resp (!) 24   Ht 1.727 m (5' 8)   Wt 127 kg   SpO2 96%   BMI 42.57 kg/m   Physical Exam Vitals and nursing note reviewed.  Constitutional:      General: She is not in acute distress.    Appearance: She is well-developed.  HENT:     Head: Normocephalic and atraumatic.     Right Ear: External ear normal.     Left Ear: External ear normal.  Eyes:     General: No scleral icterus.       Right eye: No discharge.        Left eye: No discharge.     Conjunctiva/sclera: Conjunctivae normal.  Neck:     Trachea: No tracheal deviation.  Cardiovascular:     Rate and Rhythm: Normal rate and regular rhythm.  Pulmonary:     Effort: Pulmonary effort is normal. No respiratory distress.     Breath sounds: Normal breath sounds. No stridor. No wheezing or rales.  Abdominal:     General: Bowel sounds are normal. There is no distension.     Palpations: Abdomen is soft.     Tenderness: There is no abdominal tenderness. There is no guarding or rebound.  Musculoskeletal:        General: No tenderness or deformity.     Cervical back: Neck supple.  Skin:    General: Skin is warm and dry.     Findings: No rash.  Neurological:     General: No focal deficit present.     Mental Status: She is alert.     Cranial Nerves: No cranial nerve deficit, dysarthria or facial asymmetry.     Sensory: No sensory deficit.     Motor: No abnormal muscle tone or seizure activity.     Coordination: Coordination normal.  Psychiatric:        Mood and Affect: Mood normal.     (all labs ordered are listed, but only abnormal results are displayed) Labs Reviewed  COMPREHENSIVE METABOLIC PANEL WITH GFR - Abnormal; Notable for the following components:       Result Value   CO2 33 (*)    Glucose, Bld 135 (*)    Creatinine, Ser 1.56 (*)    GFR, Estimated 34 (*)    All other components within normal limits  URINALYSIS, ROUTINE W REFLEX MICROSCOPIC - Abnormal; Notable for the following components:   Glucose, UA >1,000 (*)    Ketones, ur TRACE (*)    Protein, ur TRACE (*)    Bacteria, UA RARE (*)    All other components within normal limits  CBG MONITORING, ED - Abnormal; Notable for the following components:  Glucose-Capillary 126 (*)    All other components within normal limits  TROPONIN T, HIGH SENSITIVITY - Abnormal; Notable for the following components:   Troponin T High Sensitivity 36 (*)    All other components within normal limits  LIPASE, BLOOD  CBC  TROPONIN T, HIGH SENSITIVITY    EKG: EKG Interpretation Date/Time:  Monday January 30 2024 22:01:56 EST Ventricular Rate:  60 PR Interval:  221 QRS Duration:  142 QT Interval:  495 QTC Calculation: 495 R Axis:   -57  Text Interpretation: Sinus rhythm Borderline prolonged PR interval Nonspecific IVCD with LAD Left ventricular hypertrophy No significant change since last tracing Confirmed by Randol Simmonds [phone]) on 01/30/2024 10:09:53 PM  Radiology: No results found.   Procedures   Medications Ordered in the ED  pantoprazole  (PROTONIX ) injection 40 mg (has no administration in time range)    Clinical Course as of 01/30/24 2350  Mon Jan 30, 2024  2208 Comprehensive metabolic panel(!) Creatinine increased compared to last [JK]  2209 Urinalysis, Routine w reflex microscopic -Urine, Clean Catch(!) No signs of infection [JK]  2209 CBC Normal [JK]  2246 Troponin T, High Sensitivity(!) Troponin slightly increased at 36 [JK]    Clinical Course User Index [JK] Randol Simmonds, MD                                 Medical Decision Making Problems Addressed: Chest pain, unspecified type: acute illness or injury  Amount and/or Complexity of Data Reviewed Labs: ordered.  Decision-making details documented in ED Course.  Risk Prescription drug management.   Patient presented to the ED with complaints of anxiety but also symptoms of chest discomfort.  Patient was calm and did not appear to be in any distress on my exam.  She reports symptoms of acid reflux.  She is already on medications for that at home.  Patient's initial ED workup did not show any signs of acute hepatitis or pancreatitis.  Urinalysis not suggestive of infection.  Initial cardiac enzyme was slightly elevated at 36.  Symptoms overall atypical ACS.  Will recheck to make sure delta troponin is stable.  Chest x-ray currently pending.  Case turned over to Dr. Cleotilde at shift change.  Anticipate discharge if second troponin is unchanged.  Could consider the addition of Carafate      Final diagnoses:  Chest pain, unspecified type    ED Discharge Orders          Ordered    sucralfate  (CARAFATE ) 1 g tablet  3 times daily        01/30/24 2350               Randol Simmonds, MD 01/30/24 2350  "

## 2024-01-30 NOTE — ED Notes (Signed)
CBG 126

## 2024-01-31 ENCOUNTER — Emergency Department (HOSPITAL_BASED_OUTPATIENT_CLINIC_OR_DEPARTMENT_OTHER): Admitting: Radiology

## 2024-01-31 LAB — TROPONIN T, HIGH SENSITIVITY: Troponin T High Sensitivity: 37 ng/L — ABNORMAL HIGH (ref 0–19)

## 2024-01-31 MED ORDER — ALUM & MAG HYDROXIDE-SIMETH 200-200-20 MG/5ML PO SUSP
30.0000 mL | Freq: Once | ORAL | Status: AC
  Administered 2024-01-31: 30 mL via ORAL
  Filled 2024-01-31: qty 30

## 2024-01-31 NOTE — Discharge Instructions (Addendum)
 Please increase your pantoprazole  to twice a day for the next 2 weeks.  You may take an antacid such as Maalox or Mylanta or Pepto-Bismol as needed.  Return to the emergency department if you have any new or concerning symptoms.

## 2024-04-18 ENCOUNTER — Ambulatory Visit: Admitting: Podiatry
